# Patient Record
Sex: Female | Born: 1975
Health system: Southern US, Community
[De-identification: ages and names within clinical notes are randomized; demographics above are authoritative.]

## PROBLEM LIST (undated history)

## (undated) DIAGNOSIS — E785 Hyperlipidemia, unspecified: Secondary | ICD-10-CM

## (undated) DIAGNOSIS — T8859XA Other complications of anesthesia, initial encounter: Secondary | ICD-10-CM

## (undated) DIAGNOSIS — Z800 Family history of malignant neoplasm of digestive organs: Secondary | ICD-10-CM

## (undated) DIAGNOSIS — Z87442 Personal history of urinary calculi: Secondary | ICD-10-CM

## (undated) DIAGNOSIS — C50919 Malignant neoplasm of unspecified site of unspecified female breast: Secondary | ICD-10-CM

## (undated) DIAGNOSIS — Z9221 Personal history of antineoplastic chemotherapy: Secondary | ICD-10-CM

## (undated) DIAGNOSIS — Z803 Family history of malignant neoplasm of breast: Secondary | ICD-10-CM

## (undated) DIAGNOSIS — T4145XA Adverse effect of unspecified anesthetic, initial encounter: Secondary | ICD-10-CM

## (undated) HISTORY — PX: REDUCTION MAMMAPLASTY: SUR839

## (undated) HISTORY — PX: WISDOM TOOTH EXTRACTION: SHX21

## (undated) HISTORY — DX: Family history of malignant neoplasm of breast: Z80.3

## (undated) HISTORY — PX: MASTECTOMY: SHX3

## (undated) HISTORY — PX: AUGMENTATION MAMMAPLASTY: SUR837

## (undated) HISTORY — DX: Family history of malignant neoplasm of digestive organs: Z80.0

## (undated) HISTORY — PX: COLONOSCOPY: SHX174

## (undated) HISTORY — DX: Malignant neoplasm of unspecified site of unspecified female breast: C50.919

---

## 1998-05-07 ENCOUNTER — Other Ambulatory Visit: Admission: RE | Admit: 1998-05-07 | Discharge: 1998-05-07 | Payer: Self-pay | Admitting: Gynecology

## 1999-05-10 ENCOUNTER — Other Ambulatory Visit: Admission: RE | Admit: 1999-05-10 | Discharge: 1999-05-10 | Payer: Self-pay | Admitting: Gynecology

## 2000-05-21 ENCOUNTER — Other Ambulatory Visit: Admission: RE | Admit: 2000-05-21 | Discharge: 2000-05-21 | Payer: Self-pay | Admitting: Gynecology

## 2001-05-30 ENCOUNTER — Other Ambulatory Visit: Admission: RE | Admit: 2001-05-30 | Discharge: 2001-05-30 | Payer: Self-pay | Admitting: *Deleted

## 2001-06-04 ENCOUNTER — Encounter: Admission: RE | Admit: 2001-06-04 | Discharge: 2001-06-04 | Payer: Self-pay | Admitting: *Deleted

## 2001-06-04 ENCOUNTER — Encounter: Payer: Self-pay | Admitting: *Deleted

## 2002-02-07 ENCOUNTER — Encounter: Payer: Self-pay | Admitting: Otolaryngology

## 2002-02-07 ENCOUNTER — Encounter: Admission: RE | Admit: 2002-02-07 | Discharge: 2002-02-07 | Payer: Self-pay | Admitting: Otolaryngology

## 2002-03-22 ENCOUNTER — Emergency Department (HOSPITAL_COMMUNITY): Admission: EM | Admit: 2002-03-22 | Discharge: 2002-03-22 | Payer: Self-pay | Admitting: Emergency Medicine

## 2002-03-22 ENCOUNTER — Encounter: Payer: Self-pay | Admitting: Emergency Medicine

## 2004-07-06 ENCOUNTER — Other Ambulatory Visit: Admission: RE | Admit: 2004-07-06 | Discharge: 2004-07-06 | Payer: Self-pay | Admitting: Obstetrics and Gynecology

## 2005-01-03 ENCOUNTER — Encounter: Admission: RE | Admit: 2005-01-03 | Discharge: 2005-01-03 | Payer: Self-pay | Admitting: Family Medicine

## 2005-02-02 ENCOUNTER — Ambulatory Visit (HOSPITAL_COMMUNITY): Admission: RE | Admit: 2005-02-02 | Discharge: 2005-02-02 | Payer: Self-pay | Admitting: Gastroenterology

## 2005-02-02 ENCOUNTER — Encounter (INDEPENDENT_AMBULATORY_CARE_PROVIDER_SITE_OTHER): Payer: Self-pay | Admitting: *Deleted

## 2005-07-12 ENCOUNTER — Other Ambulatory Visit: Admission: RE | Admit: 2005-07-12 | Discharge: 2005-07-12 | Payer: Self-pay | Admitting: Obstetrics and Gynecology

## 2006-07-12 ENCOUNTER — Other Ambulatory Visit: Admission: RE | Admit: 2006-07-12 | Discharge: 2006-07-12 | Payer: Self-pay | Admitting: Obstetrics and Gynecology

## 2006-07-25 ENCOUNTER — Encounter: Admission: RE | Admit: 2006-07-25 | Discharge: 2006-07-25 | Payer: Self-pay | Admitting: Internal Medicine

## 2007-07-16 ENCOUNTER — Other Ambulatory Visit: Admission: RE | Admit: 2007-07-16 | Discharge: 2007-07-16 | Payer: Self-pay | Admitting: Obstetrics and Gynecology

## 2007-12-31 ENCOUNTER — Encounter: Admission: RE | Admit: 2007-12-31 | Discharge: 2007-12-31 | Payer: Self-pay | Admitting: Obstetrics and Gynecology

## 2008-07-16 ENCOUNTER — Other Ambulatory Visit: Admission: RE | Admit: 2008-07-16 | Discharge: 2008-07-16 | Payer: Self-pay | Admitting: Obstetrics and Gynecology

## 2009-07-22 ENCOUNTER — Other Ambulatory Visit: Admission: RE | Admit: 2009-07-22 | Discharge: 2009-07-22 | Payer: Self-pay | Admitting: Obstetrics and Gynecology

## 2010-07-05 ENCOUNTER — Inpatient Hospital Stay (HOSPITAL_COMMUNITY)
Admission: AD | Admit: 2010-07-05 | Discharge: 2010-07-05 | Payer: Self-pay | Source: Home / Self Care | Attending: Obstetrics and Gynecology | Admitting: Obstetrics and Gynecology

## 2010-07-26 ENCOUNTER — Ambulatory Visit (HOSPITAL_COMMUNITY)
Admission: RE | Admit: 2010-07-26 | Discharge: 2010-07-26 | Payer: Self-pay | Source: Home / Self Care | Attending: Obstetrics and Gynecology | Admitting: Obstetrics and Gynecology

## 2010-07-28 ENCOUNTER — Inpatient Hospital Stay (HOSPITAL_COMMUNITY)
Admission: AD | Admit: 2010-07-28 | Discharge: 2010-07-30 | Payer: Self-pay | Source: Home / Self Care | Attending: Obstetrics and Gynecology | Admitting: Obstetrics and Gynecology

## 2010-08-01 ENCOUNTER — Encounter: Payer: Self-pay | Admitting: Neurology

## 2010-08-01 LAB — CBC
HCT: 39.8 % (ref 36.0–46.0)
HCT: 30.4 % — ABNORMAL LOW (ref 36.0–46.0)
Hemoglobin: 13.6 g/dL (ref 12.0–15.0)
Hemoglobin: 10.5 g/dL — ABNORMAL LOW (ref 12.0–15.0)
MCV: 98.8 fL (ref 78.0–100.0)
MCV: 98.7 fL (ref 78.0–100.0)
Platelets: 236 10*3/uL (ref 150–400)
Platelets: 180 10*3/uL (ref 150–400)
RBC: 4.03 MIL/uL (ref 3.87–5.11)
WBC: 16.2 10*3/uL — ABNORMAL HIGH (ref 4.0–10.5)
WBC: 18.2 10*3/uL — ABNORMAL HIGH (ref 4.0–10.5)

## 2010-08-01 LAB — RPR: RPR Ser Ql: NONREACTIVE

## 2010-08-02 ENCOUNTER — Encounter
Admission: RE | Admit: 2010-08-02 | Discharge: 2010-08-09 | Payer: Self-pay | Source: Home / Self Care | Attending: Obstetrics and Gynecology | Admitting: Obstetrics and Gynecology

## 2010-10-05 ENCOUNTER — Other Ambulatory Visit (HOSPITAL_COMMUNITY)
Admission: RE | Admit: 2010-10-05 | Discharge: 2010-10-05 | Disposition: A | Discharge status: Home / Self Care | Payer: PRIVATE HEALTH INSURANCE | Source: Ambulatory Visit | Attending: Obstetrics and Gynecology | Admitting: Obstetrics and Gynecology

## 2010-10-05 ENCOUNTER — Other Ambulatory Visit: Payer: Self-pay | Admitting: Obstetrics and Gynecology

## 2010-10-05 DIAGNOSIS — Z01419 Encounter for gynecological examination (general) (routine) without abnormal findings: Secondary | ICD-10-CM | POA: Insufficient documentation

## 2011-10-09 ENCOUNTER — Other Ambulatory Visit: Payer: Self-pay | Admitting: Obstetrics and Gynecology

## 2011-10-09 ENCOUNTER — Other Ambulatory Visit (HOSPITAL_COMMUNITY)
Admission: RE | Admit: 2011-10-09 | Discharge: 2011-10-09 | Disposition: A | Discharge status: Home / Self Care | Payer: BC Managed Care – PPO | Source: Ambulatory Visit | Attending: Obstetrics and Gynecology | Admitting: Obstetrics and Gynecology

## 2011-10-09 DIAGNOSIS — Z01419 Encounter for gynecological examination (general) (routine) without abnormal findings: Secondary | ICD-10-CM | POA: Insufficient documentation

## 2012-10-09 ENCOUNTER — Other Ambulatory Visit (HOSPITAL_COMMUNITY)
Admission: RE | Admit: 2012-10-09 | Discharge: 2012-10-09 | Disposition: A | Discharge status: Home / Self Care | Payer: BC Managed Care – PPO | Source: Ambulatory Visit | Attending: Obstetrics and Gynecology | Admitting: Obstetrics and Gynecology

## 2012-10-09 ENCOUNTER — Other Ambulatory Visit: Payer: Self-pay | Admitting: Obstetrics and Gynecology

## 2012-10-09 DIAGNOSIS — Z01419 Encounter for gynecological examination (general) (routine) without abnormal findings: Secondary | ICD-10-CM | POA: Insufficient documentation

## 2012-10-09 DIAGNOSIS — Z1151 Encounter for screening for human papillomavirus (HPV): Secondary | ICD-10-CM | POA: Insufficient documentation

## 2012-10-25 ENCOUNTER — Emergency Department
Admission: EM | Admit: 2012-10-25 | Discharge: 2012-10-25 | Disposition: A | Discharge status: Home / Self Care | Payer: BC Managed Care – PPO | Source: Home / Self Care | Attending: Family Medicine | Admitting: Family Medicine

## 2012-10-25 ENCOUNTER — Encounter: Payer: Self-pay | Admitting: *Deleted

## 2012-10-25 DIAGNOSIS — J069 Acute upper respiratory infection, unspecified: Secondary | ICD-10-CM

## 2012-10-25 MED ORDER — AMOXICILLIN 875 MG PO TABS: 875.0000 mg | ORAL_TABLET | Freq: Two times a day (BID) | ORAL | Status: DC

## 2012-10-25 MED ORDER — BENZONATATE 200 MG PO CAPS: 200.0000 mg | ORAL_CAPSULE | Freq: Every day | ORAL | Status: DC

## 2012-10-25 NOTE — ED Provider Notes (Signed)
History     CSN: [id number]  Arrival date & time 10/25/12  0815   None     Chief Complaint  Patient presents with  . Nasal Congestion      HPI Comments: Patient complains of onset of URI symptoms 6 days ago with mild sore throat, sinus congestion, fatigue, and chills.  She then developed a headache and non-productive cough.  The history is provided by the patient.    History reviewed. No pertinent past medical history.  History reviewed. No pertinent past surgical history.  Family History  Problem Relation Age of Onset  . Heart attack Mother   . Hyperlipidemia Mother   . Hypertension Mother   . Heart failure Mother     History  Substance Use Topics  . Smoking status: Never Smoker   . Smokeless tobacco: Not on file  . Alcohol Use: No    OB History   Grav Para Term Preterm Abortions TAB SAB Ect Mult Living                  Review of Systems + sore throat + cough No pleuritic pain No wheezing + nasal congestion + post-nasal drainage No sinus pain/pressure No itchy/red eyes ? earache No hemoptysis No SOB No fever, + chills No nausea No vomiting No abdominal pain No diarrhea No urinary symptoms No skin rashes + fatigue + myalgias + headache Used OTC meds without relief  Allergies  Review of patient's allergies indicates no known allergies.  Home Medications   Current Outpatient Rx  Name  Route  Sig  Dispense  Refill  . amoxicillin (AMOXIL) 875 MG tablet   Oral   Take 1 tablet (875 mg total) by mouth 2 (two) times daily. (Rx void after 11/02/12)   20 tablet   0   . benzonatate (TESSALON) 200 MG capsule   Oral   Take 1 capsule (200 mg total) by mouth at bedtime.   12 capsule   0     BP 133/90  Pulse 107  Temp(Src) 98.2 F (36.8 C) (Oral)  Ht 5' 0.25" (1.53 m)  Wt 131 lb (59.421 kg)  BMI 25.38 kg/m2  SpO2 96%  LMP 10/18/2012  Physical Exam Nursing notes and Vital Signs reviewed. Appearance:  Patient appears healthy, stated age,  and in no acute distress Eyes:  Pupils are equal, round, and reactive to light and accomodation.  Extraocular movement is intact.  Conjunctivae are not inflamed  Ears:  Canals normal.  Tympanic membranes normal.  Nose:  Mildly congested turbinates.  No sinus tenderness.   Pharynx:  Normal Neck:  Supple.   Tender shotty posterior nodes are palpated bilaterally  Lungs:  Clear to auscultation.  Breath sounds are equal.  Chest:  Distinct tenderness to palpation over the mid-sternum.  Heart:  Regular rate and rhythm without murmurs, rubs, or gallops.  Abdomen:  Nontender without masses or hepatosplenomegaly.  Bowel sounds are present.  No CVA or flank tenderness.  Extremities:  No edema.   Skin:  No rash present.    ED Course  Procedures  none      1. Acute upper respiratory infections of unspecified site; viral URI       MDM  There is no evidence of bacterial infection today.   Treat symptomatically for now: Take Mucinex D (guaifenesin with decongestant) twice daily for congestion.  Increase fluid intake, rest. May use Afrin nasal spray (or generic oxymetazoline) twice daily for about 5 days.  Also recommend  using saline nasal spray several times daily and saline nasal irrigation (AYR is a common brand) Stop all antihistamines for now, and other non-prescription cough/cold preparations. May take Ibuprofen 200mg , 4 tabs every 8 hours with food for chest/sternum discomfort, headache, sore throat, etc. Begin Amoxicillin if not improving about one week or if persistent fever develops (Given a prescription to hold, with an expiration date)  Follow-up with family doctor if not improving about10 days.        Lattie Haw, MD 10/25/12 1003

## 2012-10-25 NOTE — ED Notes (Signed)
Pt c/o nasal congestion, HA, post nasal drip, cough and "scratchy" throat x 4 days. Denies fever. She has taken Nyquil with some relief.

## 2013-10-03 ENCOUNTER — Emergency Department
Admission: EM | Admit: 2013-10-03 | Discharge: 2013-10-03 | Disposition: A | Discharge status: Home / Self Care | Payer: BC Managed Care – PPO | Source: Home / Self Care | Attending: Family Medicine | Admitting: Family Medicine

## 2013-10-03 ENCOUNTER — Encounter: Payer: Self-pay | Admitting: Emergency Medicine

## 2013-10-03 DIAGNOSIS — J111 Influenza due to unidentified influenza virus with other respiratory manifestations: Secondary | ICD-10-CM

## 2013-10-03 DIAGNOSIS — R69 Illness, unspecified: Principal | ICD-10-CM

## 2013-10-03 HISTORY — DX: Hyperlipidemia, unspecified: E78.5

## 2013-10-03 MED ORDER — OSELTAMIVIR PHOSPHATE 75 MG PO CAPS: 75.0000 mg | ORAL_CAPSULE | Freq: Two times a day (BID) | ORAL | Status: DC

## 2013-10-03 MED ORDER — BENZONATATE 200 MG PO CAPS: ORAL_CAPSULE | ORAL | Status: DC

## 2013-10-03 NOTE — ED Provider Notes (Signed)
CSN: [id number]     Arrival date & time 10/03/13  0930 History   First MD Initiated Contact with Patient 10/03/13 [phone]     Chief Complaint  Patient presents with  . Nasal Congestion  . Generalized Body Aches  . Cough  . Headache  . Sore Throat      HPI Comments: Three days ago patient developed sudden onset sore throat, myalgias, nasal congestion, fatigue, headache, chills/sweats, and cough.  The history is provided by the patient.    Past Medical History  Diagnosis Date  . Hyperlipidemia    History reviewed. No pertinent past surgical history. Family History  Problem Relation Age of Onset  . Heart attack Mother   . Hyperlipidemia Mother   . Hypertension Mother   . Heart failure Mother    History  Substance Use Topics  . Smoking status: Never Smoker   . Smokeless tobacco: Not on file  . Alcohol Use: No   OB History   Grav Para Term Preterm Abortions TAB SAB Ect Mult Living                 Review of Systems + sore throat + cough No pleuritic pain No wheezing + nasal congestion + post-nasal drainage No sinus pain/pressure No itchy/red eyes No earache No hemoptysis No SOB ? fever, + chills/sweats No nausea + vomiting, resolved No abdominal pain No diarrhea No urinary symptoms No skin rash + fatigue + myalgias + headache Used OTC meds without relief  Allergies  Review of patient's allergies indicates no known allergies.  Home Medications   Current Outpatient Rx  Name  Route  Sig  Dispense  Refill  . lovastatin (MEVACOR) 20 MG tablet   Oral   Take 20 mg by mouth at bedtime.         . benzonatate (TESSALON) 200 MG capsule      Take one cap by mouth at bedtime as needed for cough   12 capsule   0   . oseltamivir (TAMIFLU) 75 MG capsule   Oral   Take 1 capsule (75 mg total) by mouth every 12 (twelve) hours.   10 capsule   0    BP 123/85  Pulse 93  Temp(Src) 98.4 F (36.9 C) (Oral)  Resp 16  Ht 5\' 1"  (1.549 m)  Wt 135 lb (61.236 kg)   BMI 25.52 kg/m2  SpO2 98%  LMP 09/16/2013 Physical Exam Nursing notes and Vital Signs reviewed. Appearance:  Patient appears healthy, stated age, and in no acute distress Eyes:  Pupils are equal, round, and reactive to light and accomodation.  Extraocular movement is intact.  Conjunctivae are not inflamed  Ears:  Canals normal.  Tympanic membranes normal.  Nose:  Mildly congested turbinates.  No sinus tenderness.   Pharynx:  Normal Neck:  Supple.  Tender right posterior nodes are palpated   Lungs:  Clear to auscultation.  Breath sounds are equal.  Chest:  Distinct tenderness to palpation over the mid-sternum.  Heart:  Regular rate and rhythm without murmurs, rubs, or gallops.  Abdomen:  Nontender without masses or hepatosplenomegaly.  Bowel sounds are present.  No CVA or flank tenderness.  Extremities:  No edema.  No calf tenderness Skin:  No rash present.   ED Course  Procedures  none      MDM   1. Influenza-like illness    Begin Tamiflu.  Prescription written for Benzonatate [facility]) to take at bedtime for night-time cough.  Take plain Mucinex (1200  mg guaifenesin) twice daily for cough and congestion.  May add Sudafed for sinus congestion.   Increase fluid intake, rest. May use Afrin nasal spray (or generic oxymetazoline) twice daily for about 5 days.  Also recommend using saline nasal spray several times daily and saline nasal irrigation (AYR is a common brand) Try warm salt water gargles for sore throat.  Stop all antihistamines for now, and other non-prescription cough/cold preparations. May take Ibuprofen 200mg , 4 tabs every 8 hours with food for chest/sternum discomfort.   Follow-up with family doctor if not improving 7 to 10 days.    Kandra Nicolas, MD 10/05/13 2150

## 2013-10-03 NOTE — ED Notes (Signed)
Reports onset of congestion, body aches, cough, headache and sore throat 3 days ago; remained in bed past 2 days; denies known fever. No OTCs today.

## 2013-10-03 NOTE — Discharge Instructions (Signed)
Take plain Mucinex (1200 mg guaifenesin) twice daily for cough and congestion.  May add Sudafed for sinus congestion.   Increase fluid intake, rest. May use Afrin nasal spray (or generic oxymetazoline) twice daily for about 5 days.  Also recommend using saline nasal spray several times daily and saline nasal irrigation (AYR is a common brand) Try warm salt water gargles for sore throat.  Stop all antihistamines for now, and other non-prescription cough/cold preparations. May take Ibuprofen 200mg , 4 tabs every 8 hours with food for chest/sternum discomfort.   Follow-up with family doctor if not improving 7 to 10 days.

## 2013-11-04 ENCOUNTER — Emergency Department
Admission: EM | Admit: 2013-11-04 | Discharge: 2013-11-04 | Disposition: A | Discharge status: Home / Self Care | Payer: BC Managed Care – PPO | Source: Home / Self Care | Attending: Emergency Medicine | Admitting: Emergency Medicine

## 2013-11-04 ENCOUNTER — Encounter: Payer: Self-pay | Admitting: Emergency Medicine

## 2013-11-04 DIAGNOSIS — J029 Acute pharyngitis, unspecified: Secondary | ICD-10-CM

## 2013-11-04 DIAGNOSIS — J020 Streptococcal pharyngitis: Secondary | ICD-10-CM

## 2013-11-04 LAB — POCT RAPID STREP A (OFFICE): RAPID STREP A SCREEN: POSITIVE — AB

## 2013-11-04 MED ORDER — AMOXICILLIN 875 MG PO TABS: 875.0000 mg | ORAL_TABLET | Freq: Two times a day (BID) | ORAL | Status: DC

## 2013-11-04 NOTE — ED Notes (Signed)
Alexis Underwood c/o bilateral ear pain and sore throat x this AM. Denies fever.

## 2013-11-04 NOTE — ED Provider Notes (Signed)
CSN: [id number]     Arrival date & time 11/04/13  1744 History   First MD Initiated Contact with Patient 11/04/13 1754     Chief Complaint  Patient presents with  . Sore Throat  . Otalgia   (Consider location/radiation/quality/duration/timing/severity/associated sxs/prior Treatment) HPI Alexis Underwood is a 38 y.o. female who complains of onset of cold symptoms for 1 days.  The symptoms are constant and mild-moderate in severity.  Hasn't used any medications yet. + sore throat No cough No pleuritic pain No wheezing No nasal congestion No post-nasal drainage No sinus pain/pressure No chest congestion No itchy/red eyes + L earache No hemoptysis No SOB No chills/sweats No fever No nausea No vomiting No abdominal pain No diarrhea No skin rashes + fatigue No myalgias No headache     Past Medical History  Diagnosis Date  . Hyperlipidemia    History reviewed. No pertinent past surgical history. Family History  Problem Relation Age of Onset  . Heart attack Mother   . Hyperlipidemia Mother   . Hypertension Mother   . Heart failure Mother    History  Substance Use Topics  . Smoking status: Never Smoker   . Smokeless tobacco: Never Used  . Alcohol Use: No   OB History   Grav Para Term Preterm Abortions TAB SAB Ect Mult Living                 Review of Systems  All other systems reviewed and are negative.   Allergies  Review of patient's allergies indicates no known allergies.  Home Medications   Prior to Admission medications   Medication Sig Start Date End Date Taking? Authorizing Provider  benzonatate (TESSALON) 200 MG capsule Take one cap by mouth at bedtime as needed for cough 10/03/13   Kandra Nicolas, MD  lovastatin (MEVACOR) 20 MG tablet Take 20 mg by mouth at bedtime.    Historical Provider, MD  oseltamivir (TAMIFLU) 75 MG capsule Take 1 capsule (75 mg total) by mouth every 12 (twelve) hours. 10/03/13   Kandra Nicolas, MD   BP 137/88  Pulse 92  Temp(Src)  98.3 F (36.8 C) (Oral)  Resp 14  Wt 140 lb (63.504 kg)  SpO2 98%  LMP 10/17/2013 Physical Exam  Nursing note and vitals reviewed. Constitutional: She is oriented to person, place, and time. She appears well-developed and well-nourished.  HENT:  Head: Normocephalic and atraumatic.  Right Ear: Tympanic membrane, external ear and ear canal normal.  Left Ear: Tympanic membrane, external ear and ear canal normal.  Nose: Nose normal.  Mouth/Throat: Posterior oropharyngeal erythema present.  Tonsils 1+ bilateral with minimal exudate  Eyes: No scleral icterus.  Neck: Neck supple.  Cardiovascular: Normal rate, regular rhythm and normal heart sounds.   Pulmonary/Chest: Effort normal and breath sounds normal. No respiratory distress. She has no decreased breath sounds. She has no wheezes.  Lymphadenopathy:       Head (right side): Tonsillar adenopathy present.       Head (left side): Tonsillar adenopathy present.  Neurological: She is alert and oriented to person, place, and time.  Skin: Skin is warm and dry.  Psychiatric: She has a normal mood and affect. Her speech is normal.    ED Course  Procedures (including critical care time) Labs Review Labs Reviewed  POCT RAPID STREP A (OFFICE) - Abnormal; Notable for the following:    Rapid Strep A Screen Positive (*)    All other components within normal limits    Imaging  Review No results found.   MDM   1. Acute pharyngitis   2. Streptococcal sore throat   Take the prescribed antibiotic as instructed.  Rapid strep positive.  Change toothbrush tomorrow, good hand & mouth hygiene.  OTC NSAIDs for sore throat, nasal saline PRN. Follow up with your primary doctor if no improvement in 5-7 days, sooner if increasing pain, fever, or new symptoms.     Janeann Forehand, MD 11/04/13 [phone]

## 2014-10-19 ENCOUNTER — Other Ambulatory Visit (HOSPITAL_COMMUNITY)
Admission: RE | Admit: 2014-10-19 | Discharge: 2014-10-19 | Disposition: A | Discharge status: Home / Self Care | Payer: BLUE CROSS/BLUE SHIELD | Source: Ambulatory Visit | Attending: Obstetrics and Gynecology | Admitting: Obstetrics and Gynecology

## 2014-10-19 ENCOUNTER — Other Ambulatory Visit: Payer: Self-pay | Admitting: Obstetrics and Gynecology

## 2014-10-19 DIAGNOSIS — Z01419 Encounter for gynecological examination (general) (routine) without abnormal findings: Secondary | ICD-10-CM | POA: Insufficient documentation

## 2014-10-20 LAB — CYTOLOGY - PAP

## 2015-10-19 ENCOUNTER — Other Ambulatory Visit (HOSPITAL_COMMUNITY)
Admission: RE | Admit: 2015-10-19 | Discharge: 2015-10-19 | Disposition: A | Discharge status: Home / Self Care | Payer: BLUE CROSS/BLUE SHIELD | Source: Ambulatory Visit | Attending: Obstetrics and Gynecology | Admitting: Obstetrics and Gynecology

## 2015-10-19 ENCOUNTER — Other Ambulatory Visit: Payer: Self-pay | Admitting: Obstetrics and Gynecology

## 2015-10-19 DIAGNOSIS — Z01419 Encounter for gynecological examination (general) (routine) without abnormal findings: Secondary | ICD-10-CM | POA: Insufficient documentation

## 2015-10-19 DIAGNOSIS — Z1151 Encounter for screening for human papillomavirus (HPV): Secondary | ICD-10-CM | POA: Insufficient documentation

## 2015-10-21 LAB — CYTOLOGY - PAP

## 2016-02-11 ENCOUNTER — Other Ambulatory Visit: Payer: Self-pay | Admitting: Family Medicine

## 2016-02-11 DIAGNOSIS — Z1231 Encounter for screening mammogram for malignant neoplasm of breast: Secondary | ICD-10-CM

## 2016-04-10 ENCOUNTER — Ambulatory Visit
Admission: RE | Admit: 2016-04-10 | Discharge: 2016-04-10 | Disposition: A | Discharge status: Home / Self Care | Payer: BLUE CROSS/BLUE SHIELD | Source: Ambulatory Visit | Attending: Family Medicine | Admitting: Family Medicine

## 2016-04-10 DIAGNOSIS — Z1231 Encounter for screening mammogram for malignant neoplasm of breast: Secondary | ICD-10-CM

## 2017-03-05 ENCOUNTER — Other Ambulatory Visit: Payer: Self-pay | Admitting: Obstetrics and Gynecology

## 2017-03-05 DIAGNOSIS — Z1231 Encounter for screening mammogram for malignant neoplasm of breast: Secondary | ICD-10-CM

## 2017-04-12 ENCOUNTER — Ambulatory Visit
Admission: RE | Admit: 2017-04-12 | Discharge: 2017-04-12 | Disposition: A | Discharge status: Home / Self Care | Payer: BLUE CROSS/BLUE SHIELD | Source: Ambulatory Visit | Attending: Obstetrics and Gynecology | Admitting: Obstetrics and Gynecology

## 2017-04-12 DIAGNOSIS — Z1231 Encounter for screening mammogram for malignant neoplasm of breast: Secondary | ICD-10-CM

## 2017-04-13 ENCOUNTER — Other Ambulatory Visit: Payer: Self-pay | Admitting: Obstetrics and Gynecology

## 2017-04-13 DIAGNOSIS — R928 Other abnormal and inconclusive findings on diagnostic imaging of breast: Secondary | ICD-10-CM

## 2017-04-16 ENCOUNTER — Other Ambulatory Visit: Payer: Self-pay | Admitting: Obstetrics and Gynecology

## 2017-04-16 ENCOUNTER — Ambulatory Visit
Admission: RE | Admit: 2017-04-16 | Discharge: 2017-04-16 | Disposition: A | Discharge status: Home / Self Care | Payer: BLUE CROSS/BLUE SHIELD | Source: Ambulatory Visit | Attending: Obstetrics and Gynecology | Admitting: Obstetrics and Gynecology

## 2017-04-16 DIAGNOSIS — R928 Other abnormal and inconclusive findings on diagnostic imaging of breast: Secondary | ICD-10-CM

## 2017-04-18 ENCOUNTER — Telehealth: Payer: Self-pay | Admitting: *Deleted

## 2017-04-18 HISTORY — PX: BREAST BIOPSY: SHX20

## 2017-04-18 NOTE — Telephone Encounter (Signed)
Confirmed [facility] for 04/25/17 at 1215 .  Instructions and contact information given.

## 2017-04-18 NOTE — Telephone Encounter (Signed)
Left vm regarding [place] for 04/25/17. Contact information provided.

## 2017-04-19 ENCOUNTER — Other Ambulatory Visit: Payer: Self-pay | Admitting: *Deleted

## 2017-04-19 DIAGNOSIS — C50412 Malignant neoplasm of upper-outer quadrant of left female breast: Secondary | ICD-10-CM

## 2017-04-19 DIAGNOSIS — Z170 Estrogen receptor positive status [ER+]: Principal | ICD-10-CM

## 2017-04-25 ENCOUNTER — Ambulatory Visit (HOSPITAL_BASED_OUTPATIENT_CLINIC_OR_DEPARTMENT_OTHER): Payer: BLUE CROSS/BLUE SHIELD | Admitting: Genetics

## 2017-04-25 ENCOUNTER — Encounter: Payer: Self-pay | Admitting: Hematology and Oncology

## 2017-04-25 ENCOUNTER — Ambulatory Visit
Admission: RE | Admit: 2017-04-25 | Discharge: 2017-04-25 | Disposition: A | Discharge status: Home / Self Care | Payer: BLUE CROSS/BLUE SHIELD | Source: Ambulatory Visit | Attending: Radiation Oncology | Admitting: Radiation Oncology

## 2017-04-25 ENCOUNTER — Other Ambulatory Visit (HOSPITAL_BASED_OUTPATIENT_CLINIC_OR_DEPARTMENT_OTHER): Payer: BLUE CROSS/BLUE SHIELD

## 2017-04-25 ENCOUNTER — Encounter: Payer: Self-pay | Admitting: Genetics

## 2017-04-25 ENCOUNTER — Ambulatory Visit (HOSPITAL_BASED_OUTPATIENT_CLINIC_OR_DEPARTMENT_OTHER): Payer: BLUE CROSS/BLUE SHIELD | Admitting: Hematology and Oncology

## 2017-04-25 ENCOUNTER — Ambulatory Visit: Payer: BLUE CROSS/BLUE SHIELD | Attending: General Surgery | Admitting: Physical Therapy

## 2017-04-25 ENCOUNTER — Encounter: Payer: Self-pay | Admitting: Physical Therapy

## 2017-04-25 DIAGNOSIS — Z170 Estrogen receptor positive status [ER+]: Secondary | ICD-10-CM

## 2017-04-25 DIAGNOSIS — C50412 Malignant neoplasm of upper-outer quadrant of left female breast: Secondary | ICD-10-CM | POA: Diagnosis present

## 2017-04-25 DIAGNOSIS — Z803 Family history of malignant neoplasm of breast: Secondary | ICD-10-CM

## 2017-04-25 DIAGNOSIS — Z800 Family history of malignant neoplasm of digestive organs: Secondary | ICD-10-CM

## 2017-04-25 DIAGNOSIS — R293 Abnormal posture: Secondary | ICD-10-CM | POA: Diagnosis present

## 2017-04-25 LAB — CBC WITH DIFFERENTIAL/PLATELET
BASO%: 0.5 % (ref 0.0–2.0)
BASOS ABS: 0 10*3/uL (ref 0.0–0.1)
EOS ABS: 0.1 10*3/uL (ref 0.0–0.5)
EOS%: 1.6 % (ref 0.0–7.0)
HEMATOCRIT: 40.2 % (ref 34.8–46.6)
HGB: 14 g/dL (ref 11.6–15.9)
LYMPH%: 26.4 % (ref 14.0–49.7)
MCH: 34 pg (ref 25.1–34.0)
MCHC: 34.8 g/dL (ref 31.5–36.0)
MCV: 97.5 fL (ref 79.5–101.0)
MONO#: 0.9 10*3/uL (ref 0.1–0.9)
MONO%: 10.6 % (ref 0.0–14.0)
NEUT#: 5.4 10*3/uL (ref 1.5–6.5)
NEUT%: 60.9 % (ref 38.4–76.8)
PLATELETS: 293 10*3/uL (ref 145–400)
RBC: 4.13 10*6/uL (ref 3.70–5.45)
RDW: 12.4 % (ref 11.2–14.5)
WBC: 8.9 10*3/uL (ref 3.9–10.3)
lymph#: 2.3 10*3/uL (ref 0.9–3.3)

## 2017-04-25 LAB — COMPREHENSIVE METABOLIC PANEL
ALT: 16 U/L (ref 0–55)
ANION GAP: 8 meq/L (ref 3–11)
AST: 15 U/L (ref 5–34)
Albumin: 4.2 g/dL (ref 3.5–5.0)
Alkaline Phosphatase: 81 U/L (ref 40–150)
BILIRUBIN TOTAL: 0.42 mg/dL (ref 0.20–1.20)
BUN: 12.1 mg/dL (ref 7.0–26.0)
CALCIUM: 9.1 mg/dL (ref 8.4–10.4)
CHLORIDE: 105 meq/L (ref 98–109)
CO2: 27 mEq/L (ref 22–29)
CREATININE: 0.9 mg/dL (ref 0.6–1.1)
EGFR: >60 mL/min/{1.73_m2} (ref >60)
Glucose: 102 mg/dl (ref 70–140)
Potassium: 3.7 mEq/L (ref 3.5–5.1)
Sodium: 140 mEq/L (ref 136–145)
Total Protein: 7.2 g/dL (ref 6.4–8.3)

## 2017-04-25 MED ORDER — TAMOXIFEN CITRATE 20 MG PO TABS: 20.0000 mg | ORAL_TABLET | Freq: Every day | ORAL | 0 refills | Status: DC

## 2017-04-25 NOTE — Progress Notes (Signed)
REFERRING PROVIDER: Nicholas Lose, MD [place], [place] [address]  PRIMARY PROVIDER:  Carol Ada, MD  PRIMARY REASON FOR VISIT:  1. Malignant neoplasm of upper-outer quadrant of left breast in female, estrogen receptor positive ([place])   2. Family history of breast cancer   3. Family history of throat cancer     HISTORY OF PRESENT ILLNESS:   Alexis Underwood, a 41 y.o. female, was seen for a [place] cancer genetics consultation at the request of Dr. Lindi Underwood due to a personal and family history of cancer.  [name] presents to clinic today to discuss the possibility of a hereditary predisposition to cancer, genetic testing, and to further clarify her future cancer risks, as well as potential cancer risks for family members.   In October 2018, at the age of 50, 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 She is planing on having a left mastectomy, but would like genetic test results to help make this dicision.   CANCER HISTORY:    Malignant neoplasm of upper-outer quadrant of left breast in female, estrogen receptor positive ([place])   04/16/2017 Initial Diagnosis    Screening mammogram detected calcifications left breast UOQ 9.3 cm, at 2:00: 1.4 cm and at 1:00: 8 mm; biopsy of mass at 1:00: IDC grade 2 with DCIS ER 95%, PR 90%,Ki-67 45%, HER-2 positive ratio 5.18; iopsy of the 2:00 mass and calcifications were DCIS with suspicion for microinvasion, T1 cN0 stage IA clinical stage AJCC 8        HORMONAL RISK FACTORS:  First live birth at age 80  Ovaries intact: yes.  Hysterectomy: no.  Colonoscopy: yes; reportedly normal. 'about 10 or more hears ago' Mammogram within the last year: yes.   Past Medical History:  Diagnosis Date  . Breast cancer ([place])   . Family history of breast cancer   . Family history of throat cancer   . Hyperlipidemia     No past surgical history on file.  Social History   Social  History  . Marital status: Married    Spouse name: N/A  . Number of children: N/A  . Years of education: N/A   Social History Main Topics  . Smoking status: Never Smoker  . Smokeless tobacco: Never Used  . Alcohol use No  . Drug use: No  . Sexual activity: Not Asked   Other Topics Concern  . None   Social History Narrative  . None     FAMILY HISTORY:  We obtained a detailed, 4-generation family history.  Significant diagnoses are listed below: Family History  Problem Relation Age of Onset  . Heart attack Mother   . Hyperlipidemia Mother   . Hypertension Mother   . Heart failure Mother   . Breast cancer Paternal Grandmother 52  . Heart disease Maternal Grandfather        died in 66's  . Throat cancer Paternal Grandfather 37   [name] has a 62 year old daughter.  [name] has a 90 year-old brother with no history of cancer.  He has a son and a daughter in their teens with no history of cancer.   [name] father died at the age of 84 with no history of cancer.  He had 2 brothers.  One of these uncles died in his late 45's without any history of cancer and has 2 sons in their 101's/40's.  [name] other uncle is currently in his  60's with no history of cancer.  He has a daughter in her late 20's/early 5's with no history of cancer.  [name] paternal grandfather had throat cancer in his 92's/60's and died in his 64's/70's.  [name] paternal grandmother was diagnosed with breast cancer at 66/41 years of age and died in her 18's/80's.   [name] mother is 58 with no history of cancer.  [name] has 1 maternal uncle who is in his 32's with no history of cancer and has no children.  [name] maternal grandfather died in his 40's from heart disease, and she does not know any information about her maternal grandmother.   [name] is is unaware of previous family history of genetic testing for hereditary cancer risks. Patient's maternal ancestors  are of Caucasian descent, and paternal ancestors are of Caucasian descent. There is no  reported Ashkenazi Jewish ancestry. There is no known consanguinity.  GENETIC COUNSELING ASSESSMENT: [name] is a 41 y.o. female with a personal and family history which is somewhat suggestive of a Hereditary Cancer Predisposition Syndrome. We, therefore, discussed and recommended the following at today's visit.   DISCUSSION: We reviewed the characteristics, features and inheritance patterns of hereditary cancer syndromes. We also discussed genetic testing, including the appropriate family members to test, the process of testing, insurance coverage and turn-around-time for results. We discussed the implications of a negative, positive and/or variant of uncertain significant result. We recommended Ms. Ensey pursue genetic testing for the Common Hereditary Cancer gene panel. The Hereditary Gene Panel offered by [facility] includes sequencing and/or deletion duplication testing of the following 47 genes: APC, ATM, AXIN2, BARD1, BMPR1A, BRCA1, BRCA2, BRIP1, CDH1, CDKN2A (p14ARF), CDKN2A (p16INK4a), CKD4, CHEK2, CTNNA1, DICER1, EPCAM (Deletion/duplication testing only), GREM1 (promoter region deletion/duplication testing only), KIT, MEN1, MLH1, MSH2, MSH3, MSH6, MUTYH, NBN, NF1, NHTL1, PALB2, PDGFRA, PMS2, POLD1, POLE, PTEN, RAD50, RAD51C, RAD51D, SDHB, SDHC, SDHD, SMAD4, SMARCA4. STK11, TP53, TSC1, TSC2, and VHL.  The following genes were evaluated for sequence changes only: SDHA and HOXB13 c.251G>A variant only.  We discussed that only 5-10% of cancers are associated with a Hereditary cancer predisposition syndrome.  One of the most common hereditary cancer syndromes that increases breast cancer risk is called Hereditary Breast and Ovarian Cancer (HBOC) syndrome.  This syndrome is caused by mutations in the BRCA1 and BRCA2 genes.  This syndrome increases an individual's lifetime risk to develop breast, ovarian,  pancreatic, and other types of cancer.  There are also many other cancer predisposition syndromes caused by mutations in several other genes.  We discussed that if she is found to have a mutation in one of these genes, it may impact surgical decisions, and alter future medical management recommendations such as increased cancer screenings and consideration of risk reducing surgeries.  A positive result could also have implications for the patient's family members.  A Negative result would mean we were unable to identify a hereditary component to her cancer, but does not rule out the possibility of a hereditary basis for her cancer.  There could be mutations that are undetectable by current technology, or in genes not yet tested or identified to increase cancer risk.    We discussed the potential to find a Variant of Uncertain Significance or VUS.  These are variants that have not yet been identified as pathogenic or benign, and it is unknown if this variant is associated with increased cancer risk or if this is a normal finding.  Most VUS's are  reclassified to benign or likely benign.   It should not be used to make medical management decisions. With time, we suspect the lab will determine the significance of any VUS's identified if any.   Based on Ms. Bia's personal and family history of cancer, she meets medical criteria for genetic testing. Despite that she meets criteria, she may still have an out of pocket cost. We discussed that if her out of pocket cost for testing is over $100, the laboratory will call and confirm whether she wants to proceed with testing.  If the out of pocket cost of testing is less than $100 she will be billed by the genetic testing laboratory.   PLAN: After considering the risks, benefits, and limitations, Ms. Wysocki  provided informed consent to pursue genetic testing and the blood sample was sent to [facility] for analysis of the Common Hereditary Cancers  Panel. Results should be available within approximately 2-3 weeks' time, at which point they will be disclosed by telephone to Ms. Moroney, as will any additional recommendations warranted by these results. Ms. Quin will receive a summary of her genetic counseling visit and a copy of her results once available. This information will also be available in Epic. We encouraged Ms. Fullman to remain in contact with cancer genetics annually so that we can continuously update the family history and inform her of any changes in cancer genetics and testing that may be of benefit for her family. Ms. Vanburen questions were answered to her satisfaction today. Our contact information was provided should additional questions or concerns arise.  We recommended that if genetic testing is negative, we would still recommend that her daughter start having annual mammograms/breast screening starting at age 32.    Lastly, we encouraged Ms. Siedschlag to remain in contact with cancer genetics annually so that we can continuously update the family history and inform her of any changes in cancer genetics and testing that may be of benefit for this family.   Ms.  Mandrell questions were answered to her satisfaction today. Our contact information was provided should additional questions or concerns arise. Thank you for the referral and allowing [place] to share in the care of your patient.   Tana Felts, MS Genetic Counselor [email or web address] phone: [phone]  The patient was seen for a total of 30 minutes in face-to-face genetic counseling.  The patient was accompanied today by her husband This patient was discussed with Drs. Magrinat, Alexis Underwood and/or Burr Medico who agrees with the above.

## 2017-04-25 NOTE — Therapy (Signed)
[place], [place], [address] Phone: [phone]   Fax:  [phone]  Physical Therapy Evaluation  Patient Details  Name: Alexis Underwood MRN: [id number] Date of Birth: 07/16/1975 Referring Provider: Dr. Autumn Messing  Encounter Date: 04/25/2017      PT End of Session - 04/25/17 1652    Visit Number 1   Number of Visits 2   Date for PT Re-Evaluation 06/25/17   PT Start Time 6294   PT Stop Time 7654  Also saw pt from [phone] for a total of 23 minutes   PT Time Calculation (min) 14 min   Activity Tolerance Patient tolerated treatment well   Behavior During Therapy [facility] for tasks assessed/performed      Past Medical History:  Diagnosis Date  . Breast cancer ([place])   . Family history of breast cancer   . Family history of throat cancer   . Hyperlipidemia     History reviewed. No pertinent surgical history.  There were no vitals filed for this visit.       Subjective Assessment - 04/25/17 1647    Subjective Patient reports she is here to be seen by her medical team for her newly diagnosed left breast cancer.   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 The area of calcs measures 9.3 cm and is triple positive with a Ki67 of 45%. she has no other medical problems.   Patient Stated Goals Reduce lymphedema risk and leasrn post op shoulder ROM HEP   Currently in Pain? No/denies            [facility] PT Assessment - 04/25/17 0001      Assessment   Medical Diagnosis Left breast cancer   Referring Provider Dr. Autumn Messing   Onset Date/Surgical Date 04/12/17   Hand Dominance Right   Prior Therapy none     Precautions   Precautions Other (comment)   Precaution Comments active cancer     Restrictions   Weight Bearing Restrictions No     Balance Screen   Has the patient fallen in the  past 6 months No   Has the patient had a decrease in activity level because of a fear of falling?  No   Is the patient reluctant to leave their home because of a fear of falling?  No     Home Environment   Living Environment Private residence   Living Arrangements Spouse/significant other;Children  Husband and 32 y.o. daughter   Available Help at Discharge Family     Prior Function   Level of Independence Independent   Vocation Full time employment   Technical brewer for medical office   Leisure She does not exercise     Cognition   Overall Cognitive Status Within Functional Limits for tasks assessed     Posture/Postural Control   Posture/Postural Control Postural limitations   Postural Limitations Rounded Shoulders;Forward head     ROM / Strength   AROM / PROM / Strength AROM;Strength     AROM   AROM Assessment Site Shoulder;Cervical   Right/Left Shoulder Right;Left   Right Shoulder Extension 57 Degrees   Right Shoulder Flexion 156 Degrees   Right Shoulder ABduction 157 Degrees   Right Shoulder Internal Rotation 85 Degrees   Right Shoulder External Rotation 89 Degrees   Left Shoulder Extension 50 Degrees   Left Shoulder Flexion  159 Degrees   Left Shoulder ABduction 166 Degrees   Left Shoulder Internal Rotation 79 Degrees   Left Shoulder External Rotation 90 Degrees   Cervical Flexion WNL   Cervical Extension WNL   Cervical - Right Side Bend WNL   Cervical - Left Side Bend WNL   Cervical - Right Rotation WNL   Cervical - Left Rotation WNL     Strength   Overall Strength Within functional limits for tasks performed           LYMPHEDEMA/ONCOLOGY QUESTIONNAIRE - 04/25/17 1650      Type   Cancer Type Left breast cancer     Lymphedema Assessments   Lymphedema Assessments Upper extremities     Right Upper Extremity Lymphedema   10 cm Proximal to Olecranon Process 28 cm   Olecranon Process 24.8 cm   10 cm Proximal to Ulnar Styloid Process 22.4  cm   Just Proximal to Ulnar Styloid Process 15.2 cm   Across Hand at [employer] 17.1 cm   At [place] of 2nd Digit 6 cm     Left Upper Extremity Lymphedema   10 cm Proximal to Olecranon Process 27.7 cm   Olecranon Process 24.2 cm   10 cm Proximal to Ulnar Styloid Process 22.1 cm   Just Proximal to Ulnar Styloid Process 15.2 cm   Across Hand at [employer] 17 cm   At [place] of 2nd Digit 5.9 cm         Objective measurements completed on examination: See above findings.       Patient was instructed today in a home exercise program today for post op shoulder range of motion. These included active assist shoulder flexion in sitting, scapular retraction, wall walking with shoulder abduction, and hands behind head external rotation.  She was encouraged to do these twice a day, holding 3 seconds and repeating 5 times when permitted by her physician.               PT Education - 04/25/17 1651    Education provided Yes   Education Details Lymphedema risk reduction and post op shoulder ROM HEP   Person(s) Educated Patient;Spouse   Methods Explanation;Demonstration;Handout   Comprehension Returned demonstration;Verbalized understanding              Breast Clinic Goals - 04/25/17 1654      Patient will be able to verbalize understanding of pertinent lymphedema risk reduction practices relevant to her diagnosis specifically related to skin care.   Time 1   Period Days   Status Achieved     Patient will be able to return demonstrate and/or verbalize understanding of the post-op home exercise program related to regaining shoulder range of motion.   Time 1   Period Days   Status Achieved     Patient will be able to verbalize understanding of the importance of attending the postoperative After Breast Cancer Class for further lymphedema risk reduction education and therapeutic exercise.   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 The area of calcs measures 9.3 cm and is triple positive with a Ki67 of 45%. she has no other medical problems. Her multidisciplinary medical team met prior to her assessment to deternmine a recommended treatment plan.  She is planning to have a left mastectomy and sentinel node biopsy followed by possible chemotherapy and radiation and anti-estrogen therapy. She will benefit from a post op PT visit to determine PT needs.   History and Personal Factors relevant to plan of care: None   Clinical Presentation Stable   Clinical Decision Making Low   Rehab Potential Excellent   Clinical Impairments Affecting Rehab Potential None   PT Frequency --  Eval and 1 f/u visit    PT Treatment/Interventions ADLs/Self Care Home Management;Therapeutic exercise;Patient/family education   PT Next Visit Plan Will reassess 3-4 weeks post op to determine PT needs   PT Home Exercise Plan Post op shoulder ROM HEP   Consulted and Agree with Plan of Care Patient;Family member/caregiver   Family Member Consulted Husband      Patient will benefit from skilled therapeutic intervention in order to improve the following deficits and impairments:  Decreased range of motion, Impaired UE functional use, Pain, Decreased knowledge of precautions, Postural dysfunction  Visit Diagnosis: Malignant neoplasm of upper-outer quadrant of left breast in female, estrogen receptor positive ([place]) - Plan: PT plan of care cert/re-cert  Abnormal posture - Plan: PT plan of care cert/re-cert   Patient will follow up at outpatient cancer rehab 3-4 weeks following surgery.  If the patient requires physical therapy at that time, a specific plan will be dictated and sent to the referring physician for approval. The patient was educated today on appropriate basic range of motion exercises to begin post  operatively and the importance of attending the After Breast Cancer class following surgery.  Patient was educated today on lymphedema risk reduction practices as it pertains to recommendations that will benefit the patient immediately following surgery.  She verbalized good understanding.      Problem List Patient Active Problem List   Diagnosis Date Noted  . Family history of breast cancer   . Family history of throat cancer   . Malignant neoplasm of upper-outer quadrant of left breast in female, estrogen receptor positive ([place]) 04/19/2017   Annia Friendly, PT 04/25/17 4:56 PM  [place], [place], [address] Phone: [phone]   Fax:  [phone]  Name: Alexis Underwood MRN: [id number] Date of Birth: 10-27-1975

## 2017-04-25 NOTE — Patient Instructions (Signed)

## 2017-04-25 NOTE — Progress Notes (Signed)
Nutrition Assessment  Reason for Assessment:  Pt seen in Breast Clinic  ASSESSMENT:   41 year female with new diagnosis of breast cancer.  Past medical history reviewed  Patient reports normal appetite.   Medications:  reviewed  Labs: reviewed  Anthropometrics:   Height: 61 inches Weight: 140 lb 14.4 oz BMI: 26   NUTRITION DIAGNOSIS: Food and nutrition related knowledge deficit related to new diagnosis of breast cancer as evidenced by no prior need for nutrition related information.  INTERVENTION:   Discussed and provided packet of information regarding nutritional tips for breast cancer patients.  Questions answered.  Teachback method used.  Contact information provided and patient knows to contact me with questions/concerns.    MONITORING, EVALUATION, and GOAL: Pt will consume a healthy plant based diet to maintain lean body mass throughout treatment.   Theophilus Walz B. Zenia Resides, [place], [place] Registered Dietitian [phone] (pager)

## 2017-04-25 NOTE — Progress Notes (Addendum)
Radiation Oncology         [phone]) [phone] ________________________________  Initial outpatient Consultation  Name: Alexis Underwood MRN: [id number]  Date: 04/25/2017  DOB: 08/14/1975  JK:KXFGH, Hal Hope, MD  Jovita Kussmaul, MD   REFERRING PHYSICIAN: Autumn Messing III, MD  DIAGNOSIS:    ICD-10-CM   1. Malignant neoplasm of upper-outer quadrant of left breast in female, estrogen receptor positive ([place]) C50.412    Z17.0     CHIEF COMPLAINT: Here to discuss management of left breast cancer  HISTORY OF PRESENT ILLNESS::Alexis Underwood is a 41 y.o. female who presented with screen detected bilateral breast calcifications (right appear benign), left breast mass with architectural. Left distortion masses with calcification in the upper outer quadrant 9.3 cm and two masses in the breast. On Ultrasound there was 70m mass on 1:00 and 183mat 2:00. The  axilla was negative. 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  Patient denies having any concerns regarding review of systems. Patient does use contacts and glasses.  She is with her husband and she has a 6 33r old daughter.  PREVIOUS RADIATION THERAPY: No  PAST MEDICAL HISTORY:  [place] a [place] of [place]; Family history of throat cancer; and Hyperlipidemia.    PAST SURGICAL HISTORY:No past surgical history on file.  FAMILY HISTORY: family history includes Breast cancer (age of onset: 7549in her paternal grandmother; Heart attack in her mother; Heart disease in her maternal grandfather; Heart failure in her mother; Hyperlipidemia in her mother; Hypertension in her mother; Throat cancer (age of onset: 5579in her paternal grandfather.  SOCIAL HISTORY:  reports that she has never smoked. She has never used smokeless tobacco. She reports that she does  not drink alcohol or use drugs.  ALLERGIES: Patient has no known allergies.  MEDICATIONS:  Current Outpatient Prescriptions  Medication Sig Dispense Refill  . lovastatin (MEVACOR) 20 MG tablet Take 20 mg by mouth at bedtime.    . tamoxifen (NOLVADEX) 20 MG tablet Take 1 tablet (20 mg total) by mouth daily. 30 tablet 0   No current facility-administered medications for this encounter.     REVIEW OF SYSTEMS:  A 10+ POINT REVIEW OF SYSTEMS WAS OBTAINED including neurology, dermatology, psychiatry, cardiac, respiratory, lymph, extremities, GI, GU, Musculoskeletal, constitutional, breasts, reproductive, HEENT.  All pertinent positives are noted in the HPI.  All others are negative.   PHYSICAL EXAM:  Oncology Vitals 04/25/2017  Height 155 cm  Weight 63.912 kg  Weight (lbs) 140 lbs 14 oz  BMI (kg/m2) 26.62 kg/m2  Temp 98.5  Pulse 80  Resp 18  SpO2 100  BSA (m2) 1.66 m2   General: Alert and oriented, in no acute distress HEENT: Head is normocephalic. Extraocular movements are intact. Oropharynx is clear. Neck: Neck is supple, no palpable cervical or supraclavicular lymphadenopathy. Heart: Regular in rate and rhythm with no murmurs, rubs, or gallops. Chest: Clear to auscultation bilaterally, with no rhonchi, wheezes, or rales. Abdomen: Soft, nontender, nondistended, with no rigidity or guarding. Extremities: No cyanosis or edema. Lymphatics: see Neck Exam Skin: No concerning lesions. Musculoskeletal: symmetric strength and muscle tone throughout. Neurologic: Cranial nerves II through XII are grossly intact. No obvious focalities. Speech is fluent. Coordination is intact.  Psychiatric: Judgment and insight are intact. Affect is appropriate. Oral cavities are clear.  Left breast :Post operative bruising 1-6 o'clock at the left breast. Palpable mass that is about 7 cm in the 4 o'clock. No papalble mass in axillary region. Right breast: No palpable mass in breast or axilla   ECOG =  0  0 - Asymptomatic (Fully active, able to carry on all predisease activities without restriction)  1 - Symptomatic but completely ambulatory (Restricted in physically strenuous activity but ambulatory and able to carry out work of a light or sedentary nature. For example, light housework, office work)  2 - Symptomatic, <50% in bed during the day (Ambulatory and capable of all self care but unable to carry out any work activities. Up and about more than 50% of waking hours)  3 - Symptomatic, >50% in bed, but not bedbound (Capable of only limited self-care, confined to bed or chair 50% or more of waking hours)  4 - Bedbound (Completely disabled. Cannot carry on any self-care. Totally confined to bed or chair)  5 - Death   Eustace Pen MM, Creech RH, Tormey DC, et al. [phone]). "Toxicity and response criteria of the [facility]". [place] Oncol. 5 (6): 649-55   LABORATORY DATA:  Lab Results  Component Value Date   WBC 8.9 04/25/2017   HGB 14.0 04/25/2017   HCT 40.2 04/25/2017   MCV 97.5 04/25/2017   PLT 293 04/25/2017   CMP     Component Value Date/Time   NA 140 04/25/2017 1219   K 3.7 04/25/2017 1219   CO2 27 04/25/2017 1219   GLUCOSE 102 04/25/2017 1219   BUN 12.1 04/25/2017 1219   CREATININE 0.9 04/25/2017 1219   CALCIUM 9.1 04/25/2017 1219   PROT 7.2 04/25/2017 1219   ALBUMIN 4.2 04/25/2017 1219   AST 15 04/25/2017 1219   ALT 16 04/25/2017 1219   ALKPHOS 81 04/25/2017 1219   BILITOT 0.42 04/25/2017 1219         RADIOGRAPHY: US Breast Ltd Uni Left Inc Axilla  Result Date: 04/16/2017 CLINICAL DATA:  Patient returns after screening study for evaluation of calcifications in the right breast and 4 calcifications, masses, and distortion in the left breast. EXAM: 2D DIGITAL DIAGNOSTIC BILATERAL MAMMOGRAM WITH CAD AND ADJUNCT TOMO ULTRASOUND BILATERAL BREAST COMPARISON:  04/12/2017, 04/10/2016 ACR Breast Density Category b: There are scattered areas of  fibroglandular density. FINDINGS: Magnified views are performed of calcifications in the upper-outer quadrant of the right breast. These views reveal a loose group of round calcifications without suspicious morphology or distribution. Within this region there are several circumscribed small round masses also further evaluated with ultrasound. Within the upper-outer quadrant of the left breast, there is persistent distortion and a discrete mass further evaluated sonographically. Magnified view show a large area of calcifications. Many calcifications are faint and pleomorphic. Closer to the nipple, other calcifications are more coarse, in a ductal distribution, and demonstrate layering. Area of the calcifications measures 9.3 x 2.5 x 2.3 cm. Mammographic images were processed with CAD. Right breast: On physical exam, I palpate no abnormality in the upper-outer quadrant of the right breast. I palpate no abnormality in the right axilla. Targeted ultrasound is performed, showing numerous small simple cysts within the upper-outer quadrant of the right breast. No suspicious mass is identified. Evaluation of the right axilla is negative for adenopathy. Left breast: On physical exam, I palpate focal thickening in the 2 o'clock location of the left breast. I palpate no abnormality in the lower left axilla. Targeted ultrasound is performed, demonstrating an irregular  hypoechoic mass with associated internal blood flow and internal calcifications. There is posterior acoustic enhancement. Mass measures 1.4 x 0.8 x 1.2 cm. This mass correlates with the focal palpable thickening in the 2 o'clock location 3 cm from nipple. In the 1 o'clock location 5 cm from the nipple, there is an irregular hypoechoic mass measuring 0.8 x 0.8 x 0.8 cm also associated with internal blood flow. These lesions are approximately 4.7 cm apart. In the 3 o'clock periareolar region of the left breast, there are dilated ducts. Visible calcifications are  noted within these ducts, correlating well with the layering calcifications in ductal distribution in this region of mammogram. Evaluation the left axilla is negative for adenopathy. IMPRESSION: 1. Findings compatible with benign fibrocystic changes in the upper-outer quadrant of the right breast. 2. Suspicious masses in the 2 o'clock location of the left breast 3 cm from the nipple. 3. Suspicious mass in the 1 o'clock location of the left breast 5 cm from nipple. 4. 9 cm area of suspicious calcifications in the upper-outer quadrant left breast. RECOMMENDATION: Ultrasound-guided core biopsy of the mass in the 1 o'clock location of the left breast. Ultrasound-guided core biopsy of the mass in the 2 o'clock location of the left breast. Stereotactic guided core biopsy of calcifications in the upper-outer quadrant of the left breast, remote from the other 2 biopsies. I have discussed the findings and recommendations with the patient. Results were also provided in writing at the conclusion of the visit. If applicable, a reminder letter will be sent to the patient regarding the next appointment. BI-RADS CATEGORY  4: Suspicious. Electronically Signed   By: Nolon Nations M.D.   On: 04/16/2017 17:09   US Breast Ltd Uni Right Inc Axilla  Result Date: 04/16/2017 CLINICAL DATA:  Patient returns after screening study for evaluation of calcifications in the right breast and 4 calcifications, masses, and distortion in the left breast. EXAM: 2D DIGITAL DIAGNOSTIC BILATERAL MAMMOGRAM WITH CAD AND ADJUNCT TOMO ULTRASOUND BILATERAL BREAST COMPARISON:  04/12/2017, 04/10/2016 ACR Breast Density Category b: There are scattered areas of fibroglandular density. FINDINGS: Magnified views are performed of calcifications in the upper-outer quadrant of the right breast. These views reveal a loose group of round calcifications without suspicious morphology or distribution. Within this region there are several circumscribed small round  masses also further evaluated with ultrasound. Within the upper-outer quadrant of the left breast, there is persistent distortion and a discrete mass further evaluated sonographically. Magnified view show a large area of calcifications. Many calcifications are faint and pleomorphic. Closer to the nipple, other calcifications are more coarse, in a ductal distribution, and demonstrate layering. Area of the calcifications measures 9.3 x 2.5 x 2.3 cm. Mammographic images were processed with CAD. Right breast: On physical exam, I palpate no abnormality in the upper-outer quadrant of the right breast. I palpate no abnormality in the right axilla. Targeted ultrasound is performed, showing numerous small simple cysts within the upper-outer quadrant of the right breast. No suspicious mass is identified. Evaluation of the right axilla is negative for adenopathy. Left breast: On physical exam, I palpate focal thickening in the 2 o'clock location of the left breast. I palpate no abnormality in the lower left axilla. Targeted ultrasound is performed, demonstrating an irregular hypoechoic mass with associated internal blood flow and internal calcifications. There is posterior acoustic enhancement. Mass measures 1.4 x 0.8 x 1.2 cm. This mass correlates with the focal palpable thickening in the 2 o'clock location 3 cm from nipple. In  the 1 o'clock location 5 cm from the nipple, there is an irregular hypoechoic mass measuring 0.8 x 0.8 x 0.8 cm also associated with internal blood flow. These lesions are approximately 4.7 cm apart. In the 3 o'clock periareolar region of the left breast, there are dilated ducts. Visible calcifications are noted within these ducts, correlating well with the layering calcifications in ductal distribution in this region of mammogram. Evaluation the left axilla is negative for adenopathy. IMPRESSION: 1. Findings compatible with benign fibrocystic changes in the upper-outer quadrant of the right breast. 2.  Suspicious masses in the 2 o'clock location of the left breast 3 cm from the nipple. 3. Suspicious mass in the 1 o'clock location of the left breast 5 cm from nipple. 4. 9 cm area of suspicious calcifications in the upper-outer quadrant left breast. RECOMMENDATION: Ultrasound-guided core biopsy of the mass in the 1 o'clock location of the left breast. Ultrasound-guided core biopsy of the mass in the 2 o'clock location of the left breast. Stereotactic guided core biopsy of calcifications in the upper-outer quadrant of the left breast, remote from the other 2 biopsies. I have discussed the findings and recommendations with the patient. Results were also provided in writing at the conclusion of the visit. If applicable, a reminder letter will be sent to the patient regarding the next appointment. BI-RADS CATEGORY  4: Suspicious. Electronically Signed   By: Nolon Nations M.D.   On: 04/16/2017 17:09   Mm Diag Breast Tomo Bilateral  Result Date: 04/16/2017 CLINICAL DATA:  Patient returns after screening study for evaluation of calcifications in the right breast and 4 calcifications, masses, and distortion in the left breast. EXAM: 2D DIGITAL DIAGNOSTIC BILATERAL MAMMOGRAM WITH CAD AND ADJUNCT TOMO ULTRASOUND BILATERAL BREAST COMPARISON:  04/12/2017, 04/10/2016 ACR Breast Density Category b: There are scattered areas of fibroglandular density. FINDINGS: Magnified views are performed of calcifications in the upper-outer quadrant of the right breast. These views reveal a loose group of round calcifications without suspicious morphology or distribution. Within this region there are several circumscribed small round masses also further evaluated with ultrasound. Within the upper-outer quadrant of the left breast, there is persistent distortion and a discrete mass further evaluated sonographically. Magnified view show a large area of calcifications. Many calcifications are faint and pleomorphic. Closer to the nipple,  other calcifications are more coarse, in a ductal distribution, and demonstrate layering. Area of the calcifications measures 9.3 x 2.5 x 2.3 cm. Mammographic images were processed with CAD. Right breast: On physical exam, I palpate no abnormality in the upper-outer quadrant of the right breast. I palpate no abnormality in the right axilla. Targeted ultrasound is performed, showing numerous small simple cysts within the upper-outer quadrant of the right breast. No suspicious mass is identified. Evaluation of the right axilla is negative for adenopathy. Left breast: On physical exam, I palpate focal thickening in the 2 o'clock location of the left breast. I palpate no abnormality in the lower left axilla. Targeted ultrasound is performed, demonstrating an irregular hypoechoic mass with associated internal blood flow and internal calcifications. There is posterior acoustic enhancement. Mass measures 1.4 x 0.8 x 1.2 cm. This mass correlates with the focal palpable thickening in the 2 o'clock location 3 cm from nipple. In the 1 o'clock location 5 cm from the nipple, there is an irregular hypoechoic mass measuring 0.8 x 0.8 x 0.8 cm also associated with internal blood flow. These lesions are approximately 4.7 cm apart. In the 3 o'clock periareolar region of the  left breast, there are dilated ducts. Visible calcifications are noted within these ducts, correlating well with the layering calcifications in ductal distribution in this region of mammogram. Evaluation the left axilla is negative for adenopathy. IMPRESSION: 1. Findings compatible with benign fibrocystic changes in the upper-outer quadrant of the right breast. 2. Suspicious masses in the 2 o'clock location of the left breast 3 cm from the nipple. 3. Suspicious mass in the 1 o'clock location of the left breast 5 cm from nipple. 4. 9 cm area of suspicious calcifications in the upper-outer quadrant left breast. RECOMMENDATION: Ultrasound-guided core biopsy of the  mass in the 1 o'clock location of the left breast. Ultrasound-guided core biopsy of the mass in the 2 o'clock location of the left breast. Stereotactic guided core biopsy of calcifications in the upper-outer quadrant of the left breast, remote from the other 2 biopsies. I have discussed the findings and recommendations with the patient. Results were also provided in writing at the conclusion of the visit. If applicable, a reminder letter will be sent to the patient regarding the next appointment. BI-RADS CATEGORY  4: Suspicious. Electronically Signed   By: Nolon Nations M.D.   On: 04/16/2017 17:09   Mm Screening Breast Tomo Bilateral  Result Date: 04/12/2017 CLINICAL DATA:  Screening. EXAM: 2D DIGITAL SCREENING BILATERAL MAMMOGRAM WITH CAD AND ADJUNCT TOMO COMPARISON:  Previous exam(s). ACR Breast Density Category c: The breast tissue is heterogeneously dense, which may obscure small masses. FINDINGS: In the right breast calcifications require further evaluation. In the left breast calcifications, a possible mass, and possible architectural distortion require further evaluation. The possible mass and possible distortion are annotated on the tomographic images. The calcifications are annotated on the 2D images. Images were processed with CAD. IMPRESSION: Further evaluation is suggested for possible calcifications in the right breast. Further evaluation is suggested for possible calcifications, mask, and architectural distortion in the left breast. RECOMMENDATION: Diagnostic mammogram and possibly ultrasound of both breasts. (Code:[id number]) The patient will be contacted regarding the findings, and additional imaging will be scheduled. BI-RADS CATEGORY  0: Incomplete. Need additional imaging evaluation and/or prior mammograms for comparison. Electronically Signed   By: Curlene Dolphin M.D.   On: 04/12/2017 16:44   Mm Clip Placement Left  Result Date: 04/16/2017 CLINICAL DATA:  Three left breast biopsies were  performed today. These include 2 ultrasound-guided biopsies of masses in the upper outer quadrant and a stereotactic biopsy of calcifications in the retroareolar left breast. EXAM: DIAGNOSTIC LEFT MAMMOGRAM POST ULTRASOUND AND STEREOTACTIC BIOPSIES COMPARISON:  Previous exam(s). FINDINGS: Mammographic images were obtained following ultrasound guided biopsy of a left breast mass at 1 o'clock position, ultrasound-guided biopsy of a left breast mass at 2 o'clock position and retroareolar calcifications. A ribbon shaped biopsy clip is satisfactorily positioned within an area of architectural distortion/mass in the upper-outer quadrant of the left breast. A coil shaped biopsy clip is satisfactorily positioned in a mass in the upper-outer quadrant of the left breast. An X shaped biopsy clip is satisfactorily positioned along the anterior aspect of suspicious retroareolar left breast calcifications. IMPRESSION: Satisfactory position of 3 biopsy clips as described above. Final Assessment: Post Procedure Mammograms for Marker Placement Electronically Signed   By: Curlene Dolphin M.D.   On: 04/16/2017 17:13   Mm Lt Breast Bx W Loc Dev 1st Lesion Image Bx Spec Stereo Guide  Addendum Date: 04/18/2017   ADDENDUM REPORT: 04/18/2017 08:52 ADDENDUM: Pathology revealed grade II to III invasive ductal carcinoma and ductal carcinoma in situ  with necrosis in the LEFT BREAST at 1:00 (ribbon clip). Pathology revealed ductal carcinoma in situ, suspicious for microscopic invasion in the LEFT BREAST at 2:00 (coil clip). 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 This was found to be concordant by Dr. Britta Mccreedy. Pathology results were discussed with the patient and her husband at [facility]. The patient did well after the biopsies with tenderness at the site. There was no bruising or hematoma present. Post biopsy instructions and care were reviewed and questions  were answered. The patient was encouraged to call [facility] for any additional concerns. The patient was referred to [facility] at the [facility] on April 25, 2017. Bilateral breast MRI is recommended. Pathology results reported by Sonnie Alamo RN, BSN on 04/18/2017. Electronically Signed   By: Britta Mccreedy M.D.   On: 04/18/2017 08:52   Result Date: 04/18/2017 CLINICAL DATA:  Stereotactic biopsy of suspicious calcifications in the left breast was recommended. EXAM: LEFT BREAST STEREOTACTIC CORE NEEDLE BIOPSY COMPARISON:  Previous exams. FINDINGS: The patient and I discussed the procedure of stereotactic-guided biopsy including benefits and alternatives. We discussed the high likelihood of a successful procedure. We discussed the risks of the procedure including infection, bleeding, tissue injury, clip migration, and inadequate sampling. Informed written consent was given. The usual time out protocol was performed immediately prior to the procedure. Using sterile technique and 1% Lidocaine as local anesthetic, under stereotactic guidance, a 9 gauge vacuum assisted device was used to perform core needle biopsy of calcifications in the retroareolar left breast, slightly lateral to the nipple using a superior to inferior approach. Specimen radiograph was performed showing 2 visible calcifications. Specimens with calcifications are identified for pathology. Lesion quadrant: Lower outer quadrant At the conclusion of the procedure, a X shaped tissue marker clip was deployed into the biopsy cavity. Follow-up 2-view mammogram was performed and dictated separately. IMPRESSION: Stereotactic-guided biopsy of the left breast. No apparent complications. Electronically Signed: By: Britta Mccreedy M.D. On: 04/16/2017 17:05   [place] Lt Breast Bx W Loc Dev 1st Lesion Img Bx Spec US Guide  Addendum Date: 04/18/2017   ADDENDUM REPORT: 04/18/2017  08:53 ADDENDUM: Pathology revealed grade II to III invasive ductal carcinoma and ductal carcinoma in situ with necrosis in the LEFT BREAST at 1:00 (ribbon clip). Pathology revealed ductal carcinoma in situ, suspicious for microscopic invasion in the LEFT BREAST at 2:00 (coil clip). 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 This was found to be concordant by Dr. Britta Mccreedy. Pathology results were discussed with the patient and her husband at [facility]. The patient did well after the biopsies with tenderness at the site. There was no bruising or hematoma present. Post biopsy instructions and care were reviewed and questions were answered. The patient was encouraged to call [facility] for any additional concerns. The patient was referred to the [facility] at the [facility] on April 25, 2017. Bilateral breast MRI is recommended. Pathology results reported by Sonnie Alamo RN, BSN on 04/18/2017. Electronically Signed   By: Britta Mccreedy M.D.   On: 04/18/2017 08:53   Result Date: 04/18/2017 CLINICAL DATA:  Two ultrasound-guided biopsies are recommended of suspicious masses in the left breast. This report describes the biopsy of the mass at 1 o'clock position  in the left breast approximately 5 cm from the nipple. EXAM: ULTRASOUND GUIDED LEFT BREAST CORE NEEDLE BIOPSY COMPARISON:  Previous exam(s). FINDINGS: I met with the patient and we discussed the procedure of ultrasound-guided biopsy, including benefits and alternatives. We discussed the high likelihood of a successful procedure. We discussed the risks of the procedure, including infection, bleeding, tissue injury, clip migration, and inadequate sampling. Informed written consent was given. The usual time-out protocol was performed immediately prior to the procedure. Lesion quadrant: Upper outer quadrant Using  sterile technique and 1% Lidocaine as local anesthetic, under direct ultrasound visualization, a 12 gauge spring-loaded device was used to perform biopsy of an irregular mass at 1 o'clock position 5 cm from the nipple using a lateral to medial approach. At the conclusion of the procedure a ribbon tissue marker clip was deployed into the biopsy cavity. Follow up 2 view mammogram was performed and dictated separately. IMPRESSION: Ultrasound guided biopsy of the left breast at 1 o'clock position. No apparent complications. Electronically Signed: By: Britta Mccreedy M.D. On: 04/16/2017 17:08   [place] Lt Breast Bx W Loc Dev Ea Add Lesion Img Bx Spec US Guide  Addendum Date: 04/18/2017   ADDENDUM REPORT: 04/18/2017 08:53 ADDENDUM: Pathology revealed grade II to III invasive ductal carcinoma and ductal carcinoma in situ with necrosis in the LEFT BREAST at 1:00 (ribbon clip). Pathology revealed ductal carcinoma in situ, suspicious for microscopic invasion in the LEFT BREAST at 2:00 (coil clip). 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 This was found to be concordant by Dr. Britta Mccreedy. Pathology results were discussed with the patient and her husband at [facility]. The patient did well after the biopsies with tenderness at the site. There was no bruising or hematoma present. Post biopsy instructions and care were reviewed and questions were answered. The patient was encouraged to call [facility] for any additional concerns. The patient was referred to the [facility] at the [facility] on April 25, 2017. Bilateral breast MRI is recommended. Pathology results reported by Sonnie Alamo RN, BSN on 04/18/2017. Electronically Signed   By: Britta Mccreedy M.D.   On: 04/18/2017 08:53   Result Date: 04/18/2017 CLINICAL DATA:  Two ultrasound-guided biopsies are recommended of  suspicious masses in the left breast. This describes a biopsy at 2 o'clock position approximately 3 cm from the nipple. EXAM: ULTRASOUND GUIDED LEFT BREAST CORE NEEDLE BIOPSY COMPARISON:  Previous exam(s). FINDINGS: I met with the patient and we discussed the procedure of ultrasound-guided biopsy, including benefits and alternatives. We discussed the high likelihood of a successful procedure. We discussed the risks of the procedure, including infection, bleeding, tissue injury, clip migration, and inadequate sampling. Informed written consent was given. The usual time-out protocol was performed immediately prior to the procedure. Lesion quadrant: Upper outer quadrant Using sterile technique and 1% Lidocaine as local anesthetic, under direct ultrasound visualization, a 12 gauge spring-loaded device was used to perform biopsy of mass 2 o'clock position 3 cm from the nipple using a lateral to medial approach. At the conclusion of the procedure a coil tissue marker clip was deployed into the biopsy cavity. Follow up 2 view mammogram was performed and dictated separately. IMPRESSION: Ultrasound guided biopsy of the left breast at 2 o'clock position. No apparent complications. Electronically Signed: By: Britta Mccreedy M.D. On: 04/16/2017 17:09      IMPRESSION/PLAN: Left breast cancer.  Consensus was that she should undergo left mastectomy, with lymph node biopsy. She should also  Undergo Systemic adjuvant  therapy. Radiotherapy would only be given if  the extent of the disease is much more than anticipated, or margins are positive or if the lymph nodes are positive.   Today, I talked to the patient about the findings and work-up thus far. We discussed the patient's diagnosis of left breast cancer and general treatment for this, highlighting the role of radiotherapy in the management. We discussed the available radiation techniques, and focused on the details of logistics and delivery.    We discussed the  risks, benefits, and side effects of radiotherapy if she ends up needing it, though I hope she will not. Side effects may include but not necessarily be limited to: skin irritation, fatigue. No guarantees of treatment were given. The patient was encouraged to ask questions that I answered to the best of my ability.  I wished her the best and will see her PRN.   __________________________________________   Eppie Gibson, MD  This document serves as a record of services personally performed by Eppie Gibson MD. It was created on her behalf by Delton Coombes, a trained medical scribe. The creation of this record is based on the scribe's personal observations and the provider's statements to them. This document has been checked and approved by the attending provider.

## 2017-04-25 NOTE — Progress Notes (Signed)
[place] CONSULT NOTE  Patient Care Team: Carol Ada, MD as PCP - General (Family Medicine)  CHIEF COMPLAINTS/PURPOSE OF CONSULTATION:  Newly diagnosed breast cancer  HISTORY OF PRESENTING ILLNESS:  Alexis Underwood 41 y.o. female is here because of recent diagnosis of eft breast cancer. Patient had a screening mammogram that detected extensive calcifications in the left breast upper outer quadrant extending to the nipple measuring 9.3 cm. On ultrasound there were couple of areas of her abnormal at 12:00 position 1.4 cm and 1:00 position 8 mm. 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 Biopsy of the 12:00 mass and remainder of the calcifications came back as DCIS with suspicion for microinvasion.She did not have enlarged axillary lymph nodes.She was presented this morning in the multidisciplinary tumor board and she is here today to discuss the treatment plan. She is accompanied by her husband. 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  I reviewed her records extensively and collaborated the history with the patient.  SUMMARY OF ONCOLOGIC HISTORY:   Malignant neoplasm of upper-outer quadrant of left breast in female, estrogen receptor positive ([place])   04/16/2017 Initial Diagnosis    Screening mammogram detected calcifications left breast UOQ 9.3 cm, at 2:00: 1.4 cm and at 1:00: 8 mm; biopsy of mass at 1:00: IDC grade 2 with DCIS ER 95%, PR 90%,Ki-67 45%, HER-2 positive ratio 5.18; iopsy of the 2:00 mass and calcifications were DCIS with suspicion for microinvasion, T1 cN0 stage IA clinical stage AJCC 8       MEDICAL HISTORY:  Past Medical History:  Diagnosis Date  . Hyperlipidemia     SURGICAL HISTORY: History reviewed. No pertinent surgical history.  SOCIAL HISTORY: Social History   Social History  . Marital status: Married    Spouse  name: N/A  . Number of children: N/A  . Years of education: N/A   Occupational History  . Not on file.   Social History Main Topics  . Smoking status: Never Smoker  . Smokeless tobacco: Never Used  . Alcohol use No  . Drug use: No  . Sexual activity: Not on file   Other Topics Concern  . Not on file   Social History Narrative  . No narrative on file    FAMILY HISTORY: Family History  Problem Relation Age of Onset  . Heart attack Mother   . Hyperlipidemia Mother   . Hypertension Mother   . Heart failure Mother   . Breast cancer Paternal Grandmother     ALLERGIES:  has No Known Allergies.  MEDICATIONS:  Current Outpatient Prescriptions  Medication Sig Dispense Refill  . lovastatin (MEVACOR) 20 MG tablet Take 20 mg by mouth at bedtime.    . tamoxifen (NOLVADEX) 20 MG tablet Take 1 tablet (20 mg total) by mouth daily. 30 tablet 0   No current facility-administered medications for this visit.     REVIEW OF SYSTEMS:   Constitutional: Denies fevers, chills or abnormal night sweats Eyes: Denies blurriness of vision, double vision or watery eyes Ears, nose, mouth, throat, and face: Denies mucositis or sore throat Respiratory: Denies cough, dyspnea or wheezes Cardiovascular: Denies palpitation, chest discomfort or lower extremity swelling Gastrointestinal:  Denies nausea, heartburn or change in bowel habits Skin: Denies abnormal skin rashes Lymphatics: Denies new lymphadenopathy or easy bruising Neurological:Denies numbness, tingling or new weaknesses Behavioral/Psych: Mood is stable, no new  changes  Breast:  Denies any palpable lumps or discharge All other systems were reviewed with the patient and are negative.  PHYSICAL EXAMINATION: ECOG PERFORMANCE STATUS: 1 - Symptomatic but completely ambulatory  Vitals:   04/25/17 1255  BP: (!) 156/94  Pulse: 80  Resp: 18  Temp: 98.5 F (36.9 C)  SpO2: 100%   Filed Weights   04/25/17 1255  Weight: 140 lb 14.4 oz  (63.9 kg)    GENERAL:alert, no distress and comfortable SKIN: skin color, texture, turgor are normal, no rashes or significant lesions EYES: normal, conjunctiva are pink and non-injected, sclera clear OROPHARYNX:no exudate, no erythema and lips, buccal mucosa, and tongue normal  NECK: supple, thyroid normal size, non-tender, without nodularity LYMPH:  no palpable lymphadenopathy in the cervical, axillary or inguinal LUNGS: clear to auscultation and percussion with normal breathing effort HEART: regular rate & rhythm and no murmurs and no lower extremity edema ABDOMEN:abdomen soft, non-tender and normal bowel sounds Musculoskeletal:no cyanosis of digits and no clubbing  PSYCH: alert & oriented x 3 with fluent speech NEURO: no focal motor/sensory deficits BREAST: No palpable nodules in breast. No palpable axillary or supraclavicular lymphadenopathy (exam performed in the presence of a chaperone)   LABORATORY DATA:  I have reviewed the data as listed Lab Results  Component Value Date   WBC 8.9 04/25/2017   HGB 14.0 04/25/2017   HCT 40.2 04/25/2017   MCV 97.5 04/25/2017   PLT 293 04/25/2017   Lab Results  Component Value Date   NA 140 04/25/2017   K 3.7 04/25/2017   CO2 27 04/25/2017    RADIOGRAPHIC STUDIES: I have personally reviewed the radiological reports and agreed with the findings in the report.  ASSESSMENT AND PLAN:  Malignant neoplasm of upper-outer quadrant of left breast in female, estrogen receptor positive ([place]) 04/16/2017: Screening mammogram detected calcifications left breast UOQ 9.3 cm, at 2:00: 1.4 cm and at 1:00: 8 mm; biopsy of mass at 1:00: IDC grade 2 with DCIS ER 95%, PR 90%,Ki-67 45%, HER-2 positive ratio 5.18; iopsy of the 2:00 mass and calcifications were DCIS with suspicion for microinvasion, T1 cN0 stage IA clinical stage AJCC 8  Pathology and radiology counseling: Discussed with the patient, the details of pathology including the type of breast  cancer,the clinical staging, the significance of ER, PR and HER-2/neu receptors and the implications for treatment. After reviewing the pathology in detail, we proceeded to discuss the different treatment options between surgery, radiation, chemotherapy, antiestrogen therapies.  Recommendation: 1. Mastectomy 2. Followed by adjuvant chemotherapy (depending on the final tumor size systemic chemotherapy will be decided between Taxol Herceptin versus TCH versus TCHP) 3. Followed by antiestrogen therapy with tamoxifen 20 mg daily 10 years 4. Genetics consult  Adjuvant chemotherapy would depend on the final tumor size. She may be able to receive Taxol Herceptin weekly 12 followed by Herceptin maintenance for 1 year Because she will have genetics testing, I recommended that she start antiestrogen therapy with tamoxifen 20 mg daily starting today.  Tamoxifen counseling:We discussed the risks and benefits of tamoxifen. These include but not limited to insomnia, hot flashes, mood changes, vaginal dryness, and weight gain. Although rare, serious side effects including endometrial cancer, risk of blood clots were also discussed. We strongly believe that the benefits far outweigh the risks. Patient understands these risks and consented to starting treatment.  All questions were answered. The patient knows to call the clinic with any problems, questions or concerns.    Rulon Eisenmenger, MD  04/25/17  

## 2017-04-25 NOTE — Assessment & Plan Note (Signed)
04/16/2017: Screening mammogram detected calcifications left breast UOQ 9.3 cm, at 2:00: 1.4 cm and at 1:00: 8 mm; biopsy of mass at 1:00: IDC grade 2 with DCIS ER 95%, PR 90%,Ki-67 45%, HER-2 positive ratio 5.18; iopsy of the 2:00 mass and calcifications were DCIS with suspicion for microinvasion, T1 cN0 stage IA clinical stage AJCC 8  Pathology and radiology counseling: Discussed with the patient, the details of pathology including the type of breast cancer,the clinical staging, the significance of ER, PR and HER-2/neu receptors and the implications for treatment. After reviewing the pathology in detail, we proceeded to discuss the different treatment options between surgery, radiation, chemotherapy, antiestrogen therapies.  Recommendation: 1. Mastectomy 2. Followed by adjuvant chemotherapy 3. Followed by antiestrogen therapy with tamoxifen 20 mg daily 10 years 4. Genetics consult  Adjuvant chemotherapy would depend on the final tumor size. She may be able to receive Taxol Herceptin weekly 12 followed by Herceptin maintenance for 1 year

## 2017-04-27 ENCOUNTER — Encounter: Payer: Self-pay | Admitting: General Practice

## 2017-04-27 NOTE — Progress Notes (Signed)
[place] Psychosocial Distress Screening Spiritual Care  LVM for Mame following Breast [facility]isciplinary Clinic to introduce [place] team/resources, reviewing distress screen per protocol, in order to assess for distress and other psychosocial needs.  The patient scored a 3 on the Psychosocial Distress Thermometer which indicates mild distress.   ONCBCN DISTRESS SCREENING 04/27/2017  Screening Type Initial Screening  Distress experienced in past week (1-10) 3  Practical problem type Work/school  Referral to support programs Yes    Follow up needed: No. LVM encouraging pt to return call.  She has full packet of [place] team/programming information.  As always, please also page if immediate needs arise or circumstances change.  Thank you.   [place], [place], [facility] Pager [phone] Voicemail [phone]

## 2017-05-01 ENCOUNTER — Telehealth: Payer: Self-pay | Admitting: *Deleted

## 2017-05-01 NOTE — Telephone Encounter (Signed)
  Oncology Nurse Navigator Documentation  Navigator Location: [facility] (05/01/17 1200)   )Navigator Encounter Type: [facility] Follow-up (05/01/17 1200) Telephone: Outgoing Call;Clinic/[facility]C Follow-up (05/01/17 1200)       Genetic Counseling Date: 04/25/17 (05/01/17 1200) Genetic Counseling Type: Urgent (05/01/17 1200)       Treatment Initiated Date: 04/25/17 (05/01/17 1200)                                Time Spent with Patient: 15 (05/01/17 1200)

## 2017-05-10 ENCOUNTER — Telehealth: Payer: Self-pay | Admitting: Genetics

## 2017-05-10 ENCOUNTER — Ambulatory Visit: Payer: Self-pay | Admitting: General Surgery

## 2017-05-10 DIAGNOSIS — Z170 Estrogen receptor positive status [ER+]: Principal | ICD-10-CM

## 2017-05-10 DIAGNOSIS — C50412 Malignant neoplasm of upper-outer quadrant of left female breast: Secondary | ICD-10-CM

## 2017-05-10 NOTE — Telephone Encounter (Signed)
Revealed negative genetic testing.  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   This normal result is reassuring and indicates that it is unlikely Alexis Underwood's cancer is due to a hereditary cause.  It is unlikely that there is an increased risk of another cancer due to a mutation in one of these genes.  However, genetic testing is not perfect, and cannot definitively rule out a hereditary cause.  It will be important for her to keep in contact with genetics to learn if any additional testing may be needed in the future.     Her daughter will  Need to let her doctors know about the family history of breast cancer when she is older and is recommended to have mammograms starting by age 78.

## 2017-05-15 ENCOUNTER — Encounter: Payer: Self-pay | Admitting: Genetics

## 2017-05-18 ENCOUNTER — Telehealth: Payer: Self-pay | Admitting: Hematology and Oncology

## 2017-05-18 NOTE — Telephone Encounter (Signed)
Scheduled appt per 11/7 sch message - sent reminder letter in the mail with appt date and time.

## 2017-05-21 ENCOUNTER — Ambulatory Visit: Payer: Self-pay | Admitting: Genetics

## 2017-05-21 DIAGNOSIS — C50412 Malignant neoplasm of upper-outer quadrant of left female breast: Secondary | ICD-10-CM

## 2017-05-21 DIAGNOSIS — Z800 Family history of malignant neoplasm of digestive organs: Secondary | ICD-10-CM

## 2017-05-21 DIAGNOSIS — Z1379 Encounter for other screening for genetic and chromosomal anomalies: Secondary | ICD-10-CM

## 2017-05-21 DIAGNOSIS — Z170 Estrogen receptor positive status [ER+]: Principal | ICD-10-CM

## 2017-05-21 DIAGNOSIS — Z803 Family history of malignant neoplasm of breast: Secondary | ICD-10-CM

## 2017-05-21 NOTE — Progress Notes (Signed)
HPI: Alexis Underwood was previously seen in the [place] clinic on 04/25/2017 due to a personal and family history of breast cancer and concerns regarding a hereditary predisposition to cancer. Please refer to our prior cancer genetics clinic note for more information regarding Alexis Underwood's medical, social and family histories, and our assessment and recommendations, at the time. Alexis Underwood recent genetic test results were disclosed to her, as well as recommendations warranted by these results. These results and recommendations are discussed in more detail below.  CANCER HISTORY:    Malignant neoplasm of upper-outer quadrant of left breast in female, estrogen receptor positive ([place])   04/16/2017 Initial Diagnosis    Screening mammogram detected calcifications left breast UOQ 9.3 cm, at 2:00: 1.4 cm and at 1:00: 8 mm; biopsy of mass at 1:00: IDC grade 2 with DCIS ER 95%, PR 90%,Ki-67 45%, HER-2 positive ratio 5.18; iopsy of the 2:00 mass and calcifications were DCIS with suspicion for microinvasion, T1 cN0 stage IA clinical stage AJCC 8      05/09/2017 Genetic Testing    The patient had genetic testing due to a personal and family history of breast cancer.  The Common Hereditary Cancer Panel was ordered.  The Hereditary Gene Panel offered by [facility] includes sequencing and/or deletion duplication testing of the following 47 genes: APC, ATM, AXIN2, BARD1, BMPR1A, BRCA1, BRCA2, BRIP1, CDH1, CDKN2A (p14ARF), CDKN2A (p16INK4a), CKD4, CHEK2, CTNNA1, DICER1, EPCAM (Deletion/duplication testing only), GREM1 (promoter region deletion/duplication testing only), KIT, MEN1, MLH1, MSH2, MSH3, MSH6, MUTYH, NBN, NF1, NHTL1, PALB2, PDGFRA, PMS2, POLD1, POLE, PTEN, RAD50, RAD51C, RAD51D, SDHB, SDHC, SDHD, SMAD4, SMARCA4. STK11, TP53, TSC1, TSC2, and VHL.  The following genes were evaluated for sequence changes only: SDHA and HOXB13 c.251G>A variant only.  Results- No pathogenic variants identified.  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  The date of this test report is 05/09/2017.         FAMILY HISTORY:  We obtained a detailed, 4-generation family history.  Significant diagnoses are listed below: Family History  Problem Relation Age of Onset  . Heart attack Mother   . Hyperlipidemia Mother   . Hypertension Mother   . Heart failure Mother   . Breast cancer Paternal Grandmother 53  . Heart disease Maternal Grandfather        died in 39's  . Throat cancer Paternal Grandfather 40   Alexis Underwood has a 20 year old daughter.  Alexis Underwood has a 60 year-old brother with no history of cancer.  He has a son and a daughter in their teens with no history of cancer.   Alexis Underwood father died at the age of 78 with no history of cancer.  He had 2 brothers.  One of these uncles died in his late 29's without any history of cancer and has 2 sons in their 51's/40's.  Alexis Underwood other uncle is currently in his 78's with no history of cancer.  He has a daughter in her late 20's/early 51's with no history of cancer.  Alexis Underwood paternal grandfather had throat cancer in his 36's/60's and died in his 56's/70's.  Alexis Underwood paternal grandmother was diagnosed with breast cancer at 64/41 years of age and died in her 79's/80's.   Alexis Underwood mother is 26 with no history of cancer.  Alexis Underwood has 1 maternal uncle who is in his 72's with no history of cancer and has no children.  Alexis Underwood  maternal grandfather died in his 48's from heart disease, and she does not know any information about her maternal grandmother.   Alexis Underwood is is unaware of previous family history of genetic testing for hereditary cancer risks. Patient's maternal ancestors are of Caucasian descent, and paternal ancestors are of Caucasian descent. There is no  reported Ashkenazi Jewish ancestry. There is no known consanguinity.  GENETIC TEST RESULTS: Genetic testing  performed through [facility]'s Common Hereditary Cancer Panel reported out on 05/09/2017 showed no pathogenic mutations. The Hereditary Gene Panel offered by [facility] includes sequencing and/or deletion duplication testing of the following 47 genes: APC, ATM, AXIN2, BARD1, BMPR1A, BRCA1, BRCA2, BRIP1, CDH1, CDKN2A (p14ARF), CDKN2A (p16INK4a), CKD4, CHEK2, CTNNA1, DICER1, EPCAM (Deletion/duplication testing only), GREM1 (promoter region deletion/duplication testing only), KIT, MEN1, MLH1, MSH2, MSH3, MSH6, MUTYH, NBN, NF1, NHTL1, PALB2, PDGFRA, PMS2, POLD1, POLE, PTEN, RAD50, RAD51C, RAD51D, SDHB, SDHC, SDHD, SMAD4, SMARCA4. STK11, TP53, TSC1, TSC2, and VHL.  The following genes were evaluated for sequence changes only: SDHA and HOXB13 c.251G>A variant only..  A variant of uncertain significance (VUS) in a gene called APC was also noted. c.-30626C>G (Promoter 1B)  The test report will be scanned into EPIC and will be located under the Molecular Pathology section of the Results Review tab.A portion of the result report is included below for reference.     We discussed with Alexis Underwood that because current genetic testing is not perfect, it is possible there may be a gene mutation in one of these genes that current testing cannot detect, but that chance is small. We also discussed, that there could be another gene that has not yet been discovered, or that we have not yet tested, that is responsible for the cancer diagnoses in the family. Therefore, it is important to remain in touch with cancer genetics in the future so that we can continue to offer Alexis Underwood the most up to date genetic testing.   Regarding the VUS in APC: At this time, it is unknown if this variant is associated with increased cancer risk or if this is a normal finding, but most variants such as this get reclassified to being inconsequential. It should not be used to make medical management decisions. With time, we suspect the lab will  determine the significance of this variant, if any. If we do learn more about it, we will try to contact Alexis Underwood to discuss it further. However, it is important to stay in touch with [place] periodically and keep the address and phone number up to date.  ADDITIONAL GENETIC TESTING: We discussed with Alexis Underwood that there are other genes that are associated with increased cancer risk that can be analyzed. The laboratories that offer this testing look at these additional genes via a hereditary cancer gene panel. Should Alexis Underwood wish to pursue additional genetic testing, we are happy to discuss and coordinate this testing, at any time.    CANCER SCREENING RECOMMENDATIONS: This normal result indicates that it is unlikely Alexis Underwood has an increased risk of cancer due to a mutation in one of these genes.  However, genetic testing is not perfect and cannot definitively rule out a hereditary cause for her cancer.  Therefore, Alexis Underwood. [place] was advised to continue following the cancer screening guidelines provided by her primary healthcare providers. Other factors such as her personal and family history may still affect her cancer risk.    RECOMMENDATIONS FOR FAMILY MEMBERS: Women in this family might be at some increased risk  of developing cancer, over the general population risk, simply due to the family history of cancer. We recommended women in this family have a yearly mammogram beginning at age 63, or 69 years younger than the earliest onset of cancer, an annual clinical breast exam, and perform monthly breast self-exams. We recommend Alexis Underwood. [place]'s daughter have annual mammograms starting by age 34 and inform her doctors about the family history of cancer so they can make the most appropriate recommendations for her.  Other women in the family should also inform their doctors about the family history of cancer. Women in this family should also have a gynecological exam as recommended by their primary  provider. All family members should have a colonoscopy by age 83.  FOLLOW-UP: Lastly, we discussed with Alexis Underwood. [place] that cancer genetics is a rapidly advancing field and it is possible that new genetic tests will be appropriate for her and/or her family members in the future. We encouraged her to remain in contact with cancer genetics on an annual basis so we can update her personal and family histories and let her know of advances in cancer genetics that may benefit this family.   Our contact number was provided. Alexis Underwood. [place] questions were answered to her satisfaction, and she knows she is welcome to call us at anytime with additional questions or concerns.   Ferol Luz, Alexis Underwood Genetic Counselor [email or web address]

## 2017-05-23 ENCOUNTER — Other Ambulatory Visit: Payer: Self-pay | Admitting: *Deleted

## 2017-05-23 DIAGNOSIS — C50412 Malignant neoplasm of upper-outer quadrant of left female breast: Secondary | ICD-10-CM

## 2017-05-23 DIAGNOSIS — Z170 Estrogen receptor positive status [ER+]: Principal | ICD-10-CM

## 2017-05-29 ENCOUNTER — Other Ambulatory Visit: Payer: Self-pay | Admitting: *Deleted

## 2017-05-29 ENCOUNTER — Telehealth: Payer: Self-pay | Admitting: *Deleted

## 2017-05-29 ENCOUNTER — Ambulatory Visit
Admission: RE | Admit: 2017-05-29 | Discharge: 2017-05-29 | Disposition: A | Discharge status: Home / Self Care | Payer: BLUE CROSS/BLUE SHIELD | Source: Ambulatory Visit | Attending: Hematology and Oncology | Admitting: Hematology and Oncology

## 2017-05-29 DIAGNOSIS — C50412 Malignant neoplasm of upper-outer quadrant of left female breast: Secondary | ICD-10-CM

## 2017-05-29 DIAGNOSIS — Z170 Estrogen receptor positive status [ER+]: Principal | ICD-10-CM

## 2017-05-29 MED ORDER — GADOBENATE DIMEGLUMINE 529 MG/ML IV SOLN
13.0000 mL | Freq: Once | INTRAVENOUS | Status: AC | PRN
  Administered 2017-05-29: 13 mL via INTRAVENOUS

## 2017-05-29 NOTE — Telephone Encounter (Signed)
Received call from patient stating she has some concerns about the right breast and is wanting to have an MRI.  Dr. Lindi Adie ok to order. I notified Dr. Ethlyn Gallery office as well.

## 2017-05-30 ENCOUNTER — Other Ambulatory Visit: Payer: Self-pay | Admitting: Hematology and Oncology

## 2017-05-30 DIAGNOSIS — N63 Unspecified lump in unspecified breast: Secondary | ICD-10-CM

## 2017-06-01 ENCOUNTER — Telehealth: Payer: Self-pay

## 2017-06-01 ENCOUNTER — Other Ambulatory Visit: Payer: Self-pay

## 2017-06-01 MED ORDER — TAMOXIFEN CITRATE 20 MG PO TABS: 20.0000 mg | ORAL_TABLET | Freq: Every day | ORAL | 0 refills | Status: DC

## 2017-06-01 NOTE — Telephone Encounter (Signed)
Called pt to inform her of her tamoxifen refill but advised her to stop it on November 28th two weeks prior to sx of December 12th and to restart two weeks after sx.  Cyndia Bent RN

## 2017-06-11 ENCOUNTER — Ambulatory Visit
Admission: RE | Admit: 2017-06-11 | Discharge: 2017-06-11 | Disposition: A | Discharge status: Home / Self Care | Payer: BLUE CROSS/BLUE SHIELD | Source: Ambulatory Visit | Attending: Hematology and Oncology | Admitting: Hematology and Oncology

## 2017-06-11 DIAGNOSIS — N63 Unspecified lump in unspecified breast: Secondary | ICD-10-CM

## 2017-06-11 HISTORY — PX: BREAST BIOPSY: SHX20

## 2017-06-11 MED ORDER — GADOBENATE DIMEGLUMINE 529 MG/ML IV SOLN
13.0000 mL | Freq: Once | INTRAVENOUS | Status: AC | PRN
  Administered 2017-06-11: 13 mL via INTRAVENOUS

## 2017-06-12 ENCOUNTER — Other Ambulatory Visit: Payer: BLUE CROSS/BLUE SHIELD

## 2017-06-12 NOTE — Pre-Procedure Instructions (Addendum)
Alexis Underwood  06/12/2017      [facility] - [place], [place] [address] Phone: [phone] Fax: [phone]    Your procedure is scheduled on Wednesday 06/20/2017.  Report to [facility] Admitting at [place].M.  Call this number if you have problems the morning of surgery:  [phone]   Remember:  Do not eat food or drink liquids after midnight the night before your surgery except drink all of the Ensure Pre-Surgery Drink before leaving home the morning of surgery.   Continue all medications as directed by your physician except follow these medication instructions before surgery below   Take these medicines the morning of surgery with A SIP OF WATER: Acetaminophen (Tylenol) - if needed  7 days prior to surgery STOP taking any Aspirin(unless otherwise instructed by your surgeon), Aleve, Naproxen, Ibuprofen, Motrin, Advil, Goody's, BC's, all herbal medications, fish oil, and all vitamins   Do not wear jewelry, make-up or nail polish.  Do not wear lotions, powders, or perfumes, or deodorant.  Do not shave 48 hours prior to surgery.    Do not bring valuables to the hospital.  [facility] is not responsible for any belongings or valuables.  Contacts, eyeglasses, dentures or bridgework may not be worn into surgery.  Leave your suitcase in the car.  After surgery it may be brought to your room.  For patients admitted to the hospital, discharge time will be determined by your treatment team.  Patients discharged the day of surgery will not be allowed to drive home.   Name and phone number of your driver:    Special instructions:   [facility]- Preparing For Surgery  Before surgery, you can play an important role. Because skin is not sterile, your skin needs to be as free of germs as possible. You can reduce the number of germs on your skin by washing with CHG (chlorahexidine gluconate) Soap  before surgery.  CHG is an antiseptic cleaner which kills germs and bonds with the skin to continue killing germs even after washing.  Please do not use if you have an allergy to CHG or antibacterial soaps. If your skin becomes reddened/irritated stop using the CHG.  Do not shave (including legs and underarms) for at least 48 hours prior to first CHG shower. It is OK to shave your face.  Please follow these instructions carefully.   1. Shower the NIGHT BEFORE SURGERY and the MORNING OF SURGERY with CHG.   2. If you chose to wash your hair, wash your hair first as usual with your normal shampoo.  3. After you shampoo, rinse your hair and body thoroughly to remove the shampoo.  4. Use CHG as you would any other liquid soap. You can apply CHG directly to the skin and wash gently with a scrungie or a clean washcloth.   5. Apply the CHG Soap to your body ONLY FROM THE NECK DOWN.  Do not use on open wounds or open sores. Avoid contact with your eyes, ears, mouth and genitals (private parts). Wash Face and genitals (private parts)  with your normal soap.  6. Wash thoroughly, paying special attention to the area where your surgery will be performed.  7. Thoroughly rinse your body with warm water from the neck down.  8. DO NOT shower/wash with your normal soap after using and rinsing off the CHG Soap.  9. Pat yourself dry  with a CLEAN TOWEL.  10. Wear CLEAN PAJAMAS to bed the night before surgery, wear comfortable clothes the morning of surgery  11. Place CLEAN SHEETS on your bed the night of your first shower and DO NOT SLEEP WITH PETS.    Day of Surgery: Shower as stated above. Do not apply any deodorants/lotions.  Please wear clean clothes to the hospital/surgery center.      Please read over the following fact sheets that you were given.

## 2017-06-13 ENCOUNTER — Encounter (HOSPITAL_COMMUNITY)
Admission: RE | Admit: 2017-06-13 | Discharge: 2017-06-13 | Disposition: A | Discharge status: Home / Self Care | Payer: BLUE CROSS/BLUE SHIELD | Source: Ambulatory Visit | Attending: General Surgery | Admitting: General Surgery

## 2017-06-13 ENCOUNTER — Encounter (HOSPITAL_COMMUNITY): Payer: Self-pay

## 2017-06-13 ENCOUNTER — Ambulatory Visit: Payer: Self-pay | Admitting: Plastic Surgery

## 2017-06-13 ENCOUNTER — Other Ambulatory Visit: Payer: Self-pay

## 2017-06-13 DIAGNOSIS — Z800 Family history of malignant neoplasm of digestive organs: Secondary | ICD-10-CM | POA: Diagnosis not present

## 2017-06-13 DIAGNOSIS — Z170 Estrogen receptor positive status [ER+]: Secondary | ICD-10-CM | POA: Insufficient documentation

## 2017-06-13 DIAGNOSIS — Z79899 Other long term (current) drug therapy: Secondary | ICD-10-CM | POA: Diagnosis not present

## 2017-06-13 DIAGNOSIS — Z01818 Encounter for other preprocedural examination: Secondary | ICD-10-CM | POA: Diagnosis present

## 2017-06-13 DIAGNOSIS — Z9889 Other specified postprocedural states: Secondary | ICD-10-CM | POA: Insufficient documentation

## 2017-06-13 DIAGNOSIS — E785 Hyperlipidemia, unspecified: Secondary | ICD-10-CM | POA: Insufficient documentation

## 2017-06-13 DIAGNOSIS — Z803 Family history of malignant neoplasm of breast: Secondary | ICD-10-CM | POA: Diagnosis not present

## 2017-06-13 DIAGNOSIS — C50412 Malignant neoplasm of upper-outer quadrant of left female breast: Secondary | ICD-10-CM | POA: Diagnosis present

## 2017-06-13 DIAGNOSIS — Z8249 Family history of ischemic heart disease and other diseases of the circulatory system: Secondary | ICD-10-CM | POA: Diagnosis not present

## 2017-06-13 DIAGNOSIS — C50912 Malignant neoplasm of unspecified site of left female breast: Secondary | ICD-10-CM

## 2017-06-13 DIAGNOSIS — R9431 Abnormal electrocardiogram [ECG] [EKG]: Secondary | ICD-10-CM | POA: Diagnosis not present

## 2017-06-13 DIAGNOSIS — Z1501 Genetic susceptibility to malignant neoplasm of breast: Secondary | ICD-10-CM | POA: Diagnosis not present

## 2017-06-13 LAB — CBC
HCT: 39.3 % (ref 36.0–46.0)
Hemoglobin: 13.3 g/dL (ref 12.0–15.0)
MCH: 32.5 pg (ref 26.0–34.0)
MCHC: 33.8 g/dL (ref 30.0–36.0)
MCV: 96.1 fL (ref 78.0–100.0)
Platelets: 293 10*3/uL (ref 150–400)
RBC: 4.09 MIL/uL (ref 3.87–5.11)
RDW: 11.8 % (ref 11.5–15.5)
WBC: 5.3 10*3/uL (ref 4.0–10.5)

## 2017-06-13 LAB — BASIC METABOLIC PANEL
Anion gap: 10 (ref 5–15)
BUN: 12 mg/dL (ref 6–20)
CHLORIDE: 102 mmol/L (ref 101–111)
CO2: 27 mmol/L (ref 22–32)
Calcium: 8.6 mg/dL — ABNORMAL LOW (ref 8.9–10.3)
Creatinine, Ser: 0.72 mg/dL (ref 0.44–1.00)
GFR calc non Af Amer: >60 mL/min (ref >60)
Glucose, Bld: 96 mg/dL (ref 65–99)
Potassium: 3.2 mmol/L — ABNORMAL LOW (ref 3.5–5.1)
SODIUM: 139 mmol/L (ref 135–145)

## 2017-06-13 LAB — HCG, SERUM, QUALITATIVE: Preg, Serum: NEGATIVE

## 2017-06-14 ENCOUNTER — Ambulatory Visit (HOSPITAL_BASED_OUTPATIENT_CLINIC_OR_DEPARTMENT_OTHER)
Admission: RE | Admit: 2017-06-14 | Discharge: 2017-06-14 | Disposition: A | Discharge status: Home / Self Care | Payer: BLUE CROSS/BLUE SHIELD | Source: Ambulatory Visit | Attending: Internal Medicine | Admitting: Internal Medicine

## 2017-06-14 ENCOUNTER — Ambulatory Visit (HOSPITAL_COMMUNITY)
Admission: RE | Admit: 2017-06-14 | Discharge: 2017-06-14 | Disposition: A | Discharge status: Home / Self Care | Payer: BLUE CROSS/BLUE SHIELD | Source: Ambulatory Visit | Attending: Family Medicine | Admitting: Family Medicine

## 2017-06-14 VITALS — BP 144/88 | HR 95 | Wt 137.0 lb

## 2017-06-14 DIAGNOSIS — C50412 Malignant neoplasm of upper-outer quadrant of left female breast: Secondary | ICD-10-CM

## 2017-06-14 DIAGNOSIS — Z170 Estrogen receptor positive status [ER+]: Secondary | ICD-10-CM | POA: Diagnosis not present

## 2017-06-14 NOTE — Progress Notes (Signed)
CARDIO-ONCOLOGY CLINIC CONSULT NOTE  Referring Physician: Lindi Adie   HPI: Alexis Underwood is 41 y.o. 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  Denies any significant PMHx other than her breast CA.   Very active. No heart problems. No SOB, CP or edema. No palpitations.   Echo (06/14/17); EF 55-60% normal RV. Normal diastolic function. GLS -23.3. LS not adequate   SUMMARY OF ONCOLOGIC HISTORY:       Malignant neoplasm of upper-outer quadrant of left breast in female, estrogen receptor positive (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%, PR 90%,Ki-67 45%, HER-2 positive ratio 5.18; iopsy of the 2:00 mass and calcifications were DCIS with suspicion for microinvasion, T1 cN0 stage IA clinical stage AJCC 8       Review of Systems: [y] = yes, _0  = no   General: Weight gain _1 ; Weight loss _2 ; Anorexia _3 ; Fatigue _4 ; Fever _5 ; Chills _6 ; Weakness _7   Cardiac: Chest pain/pressure _8 ; Resting SOB _9 ; Exertional SOB _10 ; Orthopnea _11 ; Pedal Edema _12 ; Palpitations _13 ; Syncope _14 ; Presyncope _15 ; Paroxysmal nocturnal dyspnea_16   Pulmonary: Cough _17 ; Wheezing_18 ; Hemoptysis_19 ; Sputum _20 ; Snoring _21   GI: Vomiting_22 ; Dysphagia_23 ; Melena_24 ; Hematochezia _25 ; Heartburn_26 ; Abdominal pain _27 ; Constipation _28 ; Diarrhea _29 ; BRBPR _30   GU: Hematuria_31 ; Dysuria _32 ; Nocturia_33   Vascular: Pain in legs with walking _34 ; Pain in feet with lying flat _35 ; Non-healing sores _36 ; Stroke _37 ; TIA _38 ; Slurred speech _39 ;  Neuro: Headaches_40 ; Vertigo_41 ; Seizures_42 ; Paresthesias_43 ;Blurred vision _44 ; Diplopia _45 ; Vision changes _46   Ortho/Skin: Arthritis _47 ; Joint pain _48 ; Muscle pain _49 ; Joint swelling _50 ;  Back Pain _51 ; Rash _52   Psych: Depression_53 ; Anxiety_54   Heme: Bleeding problems _55 ; Clotting disorders _56 ; Anemia _57   Endocrine: Diabetes _58 ; Thyroid dysfunction_59    Past Medical History:  Diagnosis Date  . Breast cancer ([place])   . Family history of breast cancer   . Family history of throat cancer   . Hyperlipidemia   . SVD (spontaneous vaginal delivery)     Current Outpatient Medications  Medication Sig Dispense Refill  . acetaminophen (TYLENOL) 325 MG tablet Take 650 mg by mouth daily as needed for headache.    . dextromethorphan-guaiFENesin (MUCINEX DM) 30-600 MG 12hr tablet Take 1 tablet by mouth 2 (two) times daily as needed for cough.    . lovastatin (MEVACOR) 40 MG tablet Take 40 mg by mouth every evening.    . lidocaine-prilocaine (EMLA) cream Apply 1 application topically daily as needed (port access).     . tamoxifen (NOLVADEX) 20 MG tablet Take 1 tablet (20 mg total) by mouth daily. (Patient not taking: Reported on 06/14/2017) 30 tablet 0   No current facility-administered medications for this encounter.     No Known Allergies    Social History   Socioeconomic History  . Marital status: Married    Spouse name: Not on file  . Number of children: Not on file  .  Years of education: Not on file  . Highest education level: Not on file  Social Needs  . Financial resource strain: Not on file  . Food insecurity - worry: Not on file  . Food insecurity - inability: Not on file  . Transportation needs - medical: Not on file  . Transportation needs - non-medical: Not on file  Occupational History  . Not on file  Tobacco Use  . Smoking status: Never Smoker  . Smokeless tobacco: Never Used  Substance and Sexual Activity  . Alcohol use: No  . Drug use: No  . Sexual activity: Not on file  Other Topics Concern  . Not on file  Social History Narrative  . Not on file      Family History  Problem Relation Age of Onset  . Heart attack Mother   .  Hyperlipidemia Mother   . Hypertension Mother   . Heart failure Mother   . Breast cancer Paternal Grandmother 90  . Heart disease Maternal Grandfather        died in 52's  . Throat cancer Paternal Grandfather 15    Vitals:   06/14/17 0857  BP: (!) 144/88  Pulse: 95  SpO2: 98%  Weight: 137 lb (62.1 kg)    PHYSICAL EXAM: General:  Well appearing. No respiratory difficulty HEENT: normal Neck: supple. no JVD. Carotids 2+ bilat; no bruits. No lymphadenopathy or thryomegaly appreciated. Cor: PMI nondisplaced. Regular rate & rhythm. No rubs, gallops or murmurs. Lungs: clear Abdomen: soft, nontender, nondistended. No hepatosplenomegaly. No bruits or masses. Good bowel sounds. Extremities: no cyanosis, clubbing, rash, edema Neuro: alert & oriented x 3, cranial nerves grossly intact. moves all 4 extremities w/o difficulty. Affect pleasant.   ASSESSMENT & PLAN: 1. Left breast Cancer, triple positive - ER 95%, PR 90%,Ki-67 45%, HER-2 positive ratio 5.18; - T1 cN0 stage IA clinical stage AJCC 8 - Explained incidence of Herceptin cardiotoxicity and role of Cardio-oncology clinic at length. Echo images reviewed personally. All parameters stable. Reviewed signs and symptoms of HF to look for. Ok to start Herceptin. Follow-up with echo in 3 months.   Glori Bickers, MD  9:28 AM

## 2017-06-14 NOTE — Progress Notes (Signed)
  Echocardiogram 2D Echocardiogram has been performed.  Alexis Underwood M 06/14/2017, 8:30 AM

## 2017-06-14 NOTE — Patient Instructions (Signed)
Your physician recommends that you schedule a follow-up appointment in: 3 months with echocardiogram  

## 2017-06-20 ENCOUNTER — Observation Stay (HOSPITAL_COMMUNITY): Payer: BLUE CROSS/BLUE SHIELD

## 2017-06-20 ENCOUNTER — Ambulatory Visit (HOSPITAL_COMMUNITY): Payer: BLUE CROSS/BLUE SHIELD | Admitting: Vascular Surgery

## 2017-06-20 ENCOUNTER — Ambulatory Visit (HOSPITAL_COMMUNITY): Payer: BLUE CROSS/BLUE SHIELD

## 2017-06-20 ENCOUNTER — Ambulatory Visit (HOSPITAL_COMMUNITY)
Admission: RE | Admit: 2017-06-20 | Discharge: 2017-06-20 | Disposition: A | Discharge status: Home / Self Care | Payer: BLUE CROSS/BLUE SHIELD | Source: Ambulatory Visit | Attending: General Surgery | Admitting: General Surgery

## 2017-06-20 ENCOUNTER — Encounter (HOSPITAL_COMMUNITY)
Admission: RE | Disposition: A | Discharge status: Home / Self Care | Payer: Self-pay | Source: Ambulatory Visit | Attending: Plastic Surgery

## 2017-06-20 ENCOUNTER — Encounter (HOSPITAL_COMMUNITY): Payer: Self-pay

## 2017-06-20 ENCOUNTER — Observation Stay (HOSPITAL_COMMUNITY)
Admission: RE | Admit: 2017-06-20 | Discharge: 2017-06-21 | Disposition: A | Discharge status: Home / Self Care | Payer: BLUE CROSS/BLUE SHIELD | Source: Ambulatory Visit | Attending: Plastic Surgery | Admitting: Plastic Surgery

## 2017-06-20 ENCOUNTER — Other Ambulatory Visit: Payer: Self-pay

## 2017-06-20 DIAGNOSIS — C50412 Malignant neoplasm of upper-outer quadrant of left female breast: Secondary | ICD-10-CM | POA: Diagnosis present

## 2017-06-20 DIAGNOSIS — Z79899 Other long term (current) drug therapy: Secondary | ICD-10-CM | POA: Diagnosis not present

## 2017-06-20 DIAGNOSIS — Z95828 Presence of other vascular implants and grafts: Secondary | ICD-10-CM

## 2017-06-20 DIAGNOSIS — Z803 Family history of malignant neoplasm of breast: Secondary | ICD-10-CM | POA: Diagnosis not present

## 2017-06-20 DIAGNOSIS — E7800 Pure hypercholesterolemia, unspecified: Secondary | ICD-10-CM | POA: Diagnosis not present

## 2017-06-20 DIAGNOSIS — C50912 Malignant neoplasm of unspecified site of left female breast: Secondary | ICD-10-CM

## 2017-06-20 DIAGNOSIS — Z170 Estrogen receptor positive status [ER+]: Secondary | ICD-10-CM | POA: Diagnosis not present

## 2017-06-20 DIAGNOSIS — Z419 Encounter for procedure for purposes other than remedying health state, unspecified: Secondary | ICD-10-CM

## 2017-06-20 DIAGNOSIS — C50919 Malignant neoplasm of unspecified site of unspecified female breast: Secondary | ICD-10-CM | POA: Diagnosis present

## 2017-06-20 HISTORY — PX: BREAST RECONSTRUCTION WITH PLACEMENT OF TISSUE EXPANDER AND FLEX HD (ACELLULAR HYDRATED DERMIS): SHX6295

## 2017-06-20 HISTORY — DX: Personal history of urinary calculi: Z87.442

## 2017-06-20 HISTORY — PX: MASTECTOMY W/ SENTINEL NODE BIOPSY: SHX2001

## 2017-06-20 HISTORY — PX: PORTACATH PLACEMENT: SHX2246

## 2017-06-20 SURGERY — MASTECTOMY WITH SENTINEL LYMPH NODE BIOPSY
Anesthesia: Regional | Site: Chest | Laterality: Right

## 2017-06-20 MED ORDER — BISACODYL 5 MG PO TBEC: 5.0000 mg | DELAYED_RELEASE_TABLET | Freq: Every day | ORAL | Status: DC | PRN

## 2017-06-20 MED ORDER — CHLORHEXIDINE GLUCONATE CLOTH 2 % EX PADS: 6.0000 | MEDICATED_PAD | Freq: Once | CUTANEOUS | Status: DC

## 2017-06-20 MED ORDER — KCL IN DEXTROSE-NACL 20-5-0.45 MEQ/L-%-% IV SOLN
INTRAVENOUS | Status: DC
  Administered 2017-06-20 – 2017-06-21 (×2): via INTRAVENOUS
  Filled 2017-06-20 (×2): qty 1000

## 2017-06-20 MED ORDER — EPHEDRINE 5 MG/ML INJ
INTRAVENOUS | Status: AC
  Filled 2017-06-20: qty 10

## 2017-06-20 MED ORDER — EPHEDRINE SULFATE 50 MG/ML IJ SOLN
INTRAMUSCULAR | Status: DC | PRN
  Administered 2017-06-20 (×5): 5 mg via INTRAVENOUS

## 2017-06-20 MED ORDER — HEPARIN SOD (PORK) LOCK FLUSH 100 UNIT/ML IV SOLN
INTRAVENOUS | Status: DC | PRN
  Administered 2017-06-20: 300 [IU]

## 2017-06-20 MED ORDER — DIPHENHYDRAMINE HCL 12.5 MG/5ML PO ELIX: 12.5000 mg | ORAL_SOLUTION | Freq: Four times a day (QID) | ORAL | Status: DC | PRN

## 2017-06-20 MED ORDER — GABAPENTIN 300 MG PO CAPS
300.0000 mg | ORAL_CAPSULE | ORAL | Status: AC
  Administered 2017-06-20: 300 mg via ORAL
  Filled 2017-06-20: qty 1

## 2017-06-20 MED ORDER — FENTANYL CITRATE (PF) 100 MCG/2ML IJ SOLN
INTRAMUSCULAR | Status: DC | PRN
  Administered 2017-06-20: 50 ug via INTRAVENOUS
  Administered 2017-06-20 (×3): 25 ug via INTRAVENOUS
  Administered 2017-06-20: 50 ug via INTRAVENOUS

## 2017-06-20 MED ORDER — NAPROXEN 250 MG PO TABS
500.0000 mg | ORAL_TABLET | Freq: Two times a day (BID) | ORAL | Status: DC | PRN
  Administered 2017-06-21: 500 mg via ORAL
  Filled 2017-06-20: qty 2

## 2017-06-20 MED ORDER — PHENYLEPHRINE 40 MCG/ML (10ML) SYRINGE FOR IV PUSH (FOR BLOOD PRESSURE SUPPORT)
PREFILLED_SYRINGE | INTRAVENOUS | Status: AC
  Filled 2017-06-20: qty 10

## 2017-06-20 MED ORDER — BUPIVACAINE HCL (PF) 0.25 % IJ SOLN
INTRAMUSCULAR | Status: DC | PRN
  Administered 2017-06-20: 5 mL

## 2017-06-20 MED ORDER — POLYMYXIN B SULFATE 500000 UNITS IJ SOLR
INTRAMUSCULAR | Status: DC | PRN
  Administered 2017-06-20: 500 mL

## 2017-06-20 MED ORDER — ONDANSETRON HCL 4 MG/2ML IJ SOLN: 4.0000 mg | Freq: Four times a day (QID) | INTRAMUSCULAR | Status: DC | PRN

## 2017-06-20 MED ORDER — HEPARIN SOD (PORK) LOCK FLUSH 100 UNIT/ML IV SOLN
INTRAVENOUS | Status: AC
  Filled 2017-06-20: qty 5

## 2017-06-20 MED ORDER — ZOLPIDEM TARTRATE 5 MG PO TABS: 5.0000 mg | ORAL_TABLET | Freq: Every evening | ORAL | Status: DC | PRN

## 2017-06-20 MED ORDER — DIAZEPAM 2 MG PO TABS: 2.0000 mg | ORAL_TABLET | Freq: Two times a day (BID) | ORAL | Status: DC | PRN

## 2017-06-20 MED ORDER — DIPHENHYDRAMINE HCL 50 MG/ML IJ SOLN: 12.5000 mg | Freq: Four times a day (QID) | INTRAMUSCULAR | Status: DC | PRN

## 2017-06-20 MED ORDER — DEXAMETHASONE SODIUM PHOSPHATE 10 MG/ML IJ SOLN
INTRAMUSCULAR | Status: AC
  Filled 2017-06-20: qty 1

## 2017-06-20 MED ORDER — HYDROCODONE-ACETAMINOPHEN 5-325 MG PO TABS
1.0000 | ORAL_TABLET | ORAL | Status: DC | PRN
  Administered 2017-06-20 – 2017-06-21 (×2): 1 via ORAL
  Filled 2017-06-20: qty 1
  Filled 2017-06-20: qty 2
  Filled 2017-06-20: qty 1

## 2017-06-20 MED ORDER — LIDOCAINE HCL (CARDIAC) 20 MG/ML IV SOLN
INTRAVENOUS | Status: DC | PRN
  Administered 2017-06-20: 60 mg via INTRAVENOUS

## 2017-06-20 MED ORDER — ACETAMINOPHEN 500 MG PO TABS
500.0000 mg | ORAL_TABLET | Freq: Four times a day (QID) | ORAL | Status: DC
  Administered 2017-06-20 – 2017-06-21 (×4): 500 mg via ORAL
  Filled 2017-06-20 (×3): qty 1

## 2017-06-20 MED ORDER — POLYETHYLENE GLYCOL 3350 17 G PO PACK: 17.0000 g | PACK | Freq: Every day | ORAL | Status: DC | PRN

## 2017-06-20 MED ORDER — ACETAMINOPHEN 500 MG PO TABS
1000.0000 mg | ORAL_TABLET | ORAL | Status: AC
  Administered 2017-06-20: 1000 mg via ORAL
  Filled 2017-06-20: qty 2

## 2017-06-20 MED ORDER — DM-GUAIFENESIN ER 30-600 MG PO TB12: 1.0000 | ORAL_TABLET | Freq: Two times a day (BID) | ORAL | Status: DC | PRN

## 2017-06-20 MED ORDER — PROPOFOL 10 MG/ML IV BOLUS
INTRAVENOUS | Status: DC | PRN
  Administered 2017-06-20: 150 mg via INTRAVENOUS

## 2017-06-20 MED ORDER — DEXAMETHASONE SODIUM PHOSPHATE 10 MG/ML IJ SOLN
INTRAMUSCULAR | Status: DC | PRN
  Administered 2017-06-20: 10 mg via INTRAVENOUS

## 2017-06-20 MED ORDER — HYDROMORPHONE HCL 1 MG/ML IJ SOLN: 1.0000 mg | INTRAMUSCULAR | Status: DC | PRN

## 2017-06-20 MED ORDER — SUCCINYLCHOLINE CHLORIDE 200 MG/10ML IV SOSY
PREFILLED_SYRINGE | INTRAVENOUS | Status: AC
  Filled 2017-06-20: qty 10

## 2017-06-20 MED ORDER — ACETAMINOPHEN 500 MG PO TABS
ORAL_TABLET | ORAL | Status: AC
  Filled 2017-06-20: qty 1

## 2017-06-20 MED ORDER — ONDANSETRON HCL 4 MG/2ML IJ SOLN
INTRAMUSCULAR | Status: AC
  Filled 2017-06-20: qty 2

## 2017-06-20 MED ORDER — CEFAZOLIN SODIUM-DEXTROSE 2-4 GM/100ML-% IV SOLN: 2.0000 g | INTRAVENOUS | Status: DC

## 2017-06-20 MED ORDER — HYDROMORPHONE HCL 1 MG/ML IJ SOLN: 0.2500 mg | INTRAMUSCULAR | Status: DC | PRN

## 2017-06-20 MED ORDER — METHYLENE BLUE 0.5 % INJ SOLN
INTRAVENOUS | Status: AC
  Filled 2017-06-20: qty 10

## 2017-06-20 MED ORDER — LACTATED RINGERS IV SOLN
INTRAVENOUS | Status: DC | PRN
  Administered 2017-06-20 (×3): via INTRAVENOUS

## 2017-06-20 MED ORDER — CELECOXIB 200 MG PO CAPS
200.0000 mg | ORAL_CAPSULE | ORAL | Status: AC
  Administered 2017-06-20: 200 mg via ORAL
  Filled 2017-06-20: qty 1

## 2017-06-20 MED ORDER — ONDANSETRON HCL 4 MG/2ML IJ SOLN
INTRAMUSCULAR | Status: DC | PRN
  Administered 2017-06-20: 4 mg via INTRAVENOUS

## 2017-06-20 MED ORDER — OXYCODONE HCL 5 MG PO TABS: 5.0000 mg | ORAL_TABLET | Freq: Once | ORAL | Status: DC | PRN

## 2017-06-20 MED ORDER — 0.9 % SODIUM CHLORIDE (POUR BTL) OPTIME
TOPICAL | Status: DC | PRN
  Administered 2017-06-20: 1000 mL

## 2017-06-20 MED ORDER — SODIUM CHLORIDE 0.9 % IV SOLN
INTRAVENOUS | Status: DC | PRN
  Administered 2017-06-20: 12:00:00 100 mL

## 2017-06-20 MED ORDER — CEFAZOLIN SODIUM-DEXTROSE 2-4 GM/100ML-% IV SOLN
2.0000 g | Freq: Three times a day (TID) | INTRAVENOUS | Status: DC
  Administered 2017-06-20 – 2017-06-21 (×3): 2 g via INTRAVENOUS
  Filled 2017-06-20 (×5): qty 100

## 2017-06-20 MED ORDER — ROCURONIUM BROMIDE 10 MG/ML (PF) SYRINGE
PREFILLED_SYRINGE | INTRAVENOUS | Status: AC
  Filled 2017-06-20: qty 5

## 2017-06-20 MED ORDER — BUPIVACAINE HCL (PF) 0.25 % IJ SOLN
INTRAMUSCULAR | Status: AC
  Filled 2017-06-20: qty 30

## 2017-06-20 MED ORDER — FENTANYL CITRATE (PF) 250 MCG/5ML IJ SOLN
INTRAMUSCULAR | Status: AC
  Filled 2017-06-20: qty 5

## 2017-06-20 MED ORDER — MIDAZOLAM HCL 5 MG/5ML IJ SOLN
INTRAMUSCULAR | Status: DC | PRN
  Administered 2017-06-20 (×2): 1 mg via INTRAVENOUS

## 2017-06-20 MED ORDER — SUGAMMADEX SODIUM 200 MG/2ML IV SOLN
INTRAVENOUS | Status: DC | PRN
  Administered 2017-06-20: 125 mg via INTRAVENOUS

## 2017-06-20 MED ORDER — ROCURONIUM BROMIDE 100 MG/10ML IV SOLN
INTRAVENOUS | Status: DC | PRN
  Administered 2017-06-20: 50 mg via INTRAVENOUS
  Administered 2017-06-20 (×3): 20 mg via INTRAVENOUS

## 2017-06-20 MED ORDER — CEFAZOLIN SODIUM-DEXTROSE 2-4 GM/100ML-% IV SOLN
2.0000 g | INTRAVENOUS | Status: AC
  Administered 2017-06-20: 2 g via INTRAVENOUS
  Filled 2017-06-20: qty 100

## 2017-06-20 MED ORDER — PROPOFOL 10 MG/ML IV BOLUS
INTRAVENOUS | Status: AC
  Filled 2017-06-20: qty 20

## 2017-06-20 MED ORDER — MIDAZOLAM HCL 2 MG/2ML IJ SOLN
INTRAMUSCULAR | Status: AC
  Filled 2017-06-20: qty 2

## 2017-06-20 MED ORDER — ONDANSETRON 4 MG PO TBDP: 4.0000 mg | ORAL_TABLET | Freq: Four times a day (QID) | ORAL | Status: DC | PRN

## 2017-06-20 MED ORDER — SUGAMMADEX SODIUM 200 MG/2ML IV SOLN
INTRAVENOUS | Status: AC
  Filled 2017-06-20: qty 2

## 2017-06-20 MED ORDER — OXYCODONE HCL 5 MG/5ML PO SOLN: 5.0000 mg | Freq: Once | ORAL | Status: DC | PRN

## 2017-06-20 MED ORDER — LIDOCAINE 2% (20 MG/ML) 5 ML SYRINGE
INTRAMUSCULAR | Status: AC
  Filled 2017-06-20: qty 5

## 2017-06-20 MED ORDER — SENNA 8.6 MG PO TABS
1.0000 | ORAL_TABLET | Freq: Two times a day (BID) | ORAL | Status: DC
  Administered 2017-06-20 – 2017-06-21 (×3): 8.6 mg via ORAL
  Filled 2017-06-20 (×3): qty 1

## 2017-06-20 MED ORDER — PHENYLEPHRINE HCL 10 MG/ML IJ SOLN
INTRAMUSCULAR | Status: DC | PRN
  Administered 2017-06-20 (×5): 80 ug via INTRAVENOUS

## 2017-06-20 MED ORDER — TECHNETIUM TC 99M SULFUR COLLOID FILTERED
1.0000 | Freq: Once | INTRAVENOUS | Status: AC | PRN
  Administered 2017-06-20: 1 via INTRADERMAL

## 2017-06-20 MED ORDER — PRAVASTATIN SODIUM 10 MG PO TABS
20.0000 mg | ORAL_TABLET | Freq: Every day | ORAL | Status: DC
  Administered 2017-06-20: 20 mg via ORAL
  Filled 2017-06-20: qty 2

## 2017-06-20 SURGICAL SUPPLY — 108 items
ADH SKN CLS APL DERMABOND .7 (GAUZE/BANDAGES/DRESSINGS) ×12
APPLIER CLIP 9.375 MED OPEN (MISCELLANEOUS) ×5
APR CLP MED 9.3 20 MLT OPN (MISCELLANEOUS) ×3
BAG DECANTER FOR FLEXI CONT (MISCELLANEOUS) ×8 IMPLANT
BINDER BREAST LRG (GAUZE/BANDAGES/DRESSINGS) ×2 IMPLANT
BINDER BREAST XLRG (GAUZE/BANDAGES/DRESSINGS) IMPLANT
BIOPATCH RED 1 DISK 7.0 (GAUZE/BANDAGES/DRESSINGS) ×10 IMPLANT
BIOPATCH RED 1IN DISK 7.0MM (GAUZE/BANDAGES/DRESSINGS) ×1
BLADE SURG 15 STRL LF DISP TIS (BLADE) ×3 IMPLANT
BLADE SURG 15 STRL SS (BLADE) ×5
CANISTER SUCT 3000ML PPV (MISCELLANEOUS) ×10 IMPLANT
CHLORAPREP W/TINT 10.5 ML (MISCELLANEOUS) ×7 IMPLANT
CHLORAPREP W/TINT 26ML (MISCELLANEOUS) ×8 IMPLANT
CLIP APPLIE 9.375 MED OPEN (MISCELLANEOUS) ×3 IMPLANT
CONT SPEC 4OZ CLIKSEAL STRL BL (MISCELLANEOUS) ×5 IMPLANT
COVER PROBE W GEL 5X96 (DRAPES) ×5 IMPLANT
COVER SURGICAL LIGHT HANDLE (MISCELLANEOUS) ×14 IMPLANT
COVER TRANSDUCER ULTRASND GEL (DRAPE) ×2 IMPLANT
CRADLE DONUT ADULT HEAD (MISCELLANEOUS) ×3 IMPLANT
DERMABOND ADVANCED (GAUZE/BANDAGES/DRESSINGS) ×8
DERMABOND ADVANCED .7 DNX12 (GAUZE/BANDAGES/DRESSINGS) ×6 IMPLANT
DEVICE DISSECT PLASMABLAD 3.0S (MISCELLANEOUS) IMPLANT
DRAIN CHANNEL 19F RND (DRAIN) ×8 IMPLANT
DRAPE C-ARM 42X72 X-RAY (DRAPES) ×5 IMPLANT
DRAPE CHEST BREAST 15X10 FENES (DRAPES) ×5 IMPLANT
DRAPE HALF SHEET 40X57 (DRAPES) ×7 IMPLANT
DRAPE ORTHO SPLIT 77X108 STRL (DRAPES) ×10
DRAPE SURG 17X23 STRL (DRAPES) ×20 IMPLANT
DRAPE SURG ORHT 6 SPLT 77X108 (DRAPES) ×6 IMPLANT
DRAPE UTILITY XL STRL (DRAPES) ×8 IMPLANT
DRAPE WARM FLUID 44X44 (DRAPE) ×5 IMPLANT
DRSG PAD ABDOMINAL 8X10 ST (GAUZE/BANDAGES/DRESSINGS) ×5 IMPLANT
DRSG TEGADERM 4X4.75 (GAUZE/BANDAGES/DRESSINGS) ×5 IMPLANT
ELECT BLADE 4.0 EZ CLEAN MEGAD (MISCELLANEOUS) ×10
ELECT CAUTERY BLADE 6.4 (BLADE) ×7 IMPLANT
ELECT REM PT RETURN 9FT ADLT (ELECTROSURGICAL) ×10
ELECTRODE BLDE 4.0 EZ CLN MEGD (MISCELLANEOUS) IMPLANT
ELECTRODE REM PT RTRN 9FT ADLT (ELECTROSURGICAL) ×6 IMPLANT
EVACUATOR SILICONE 100CC (DRAIN) ×8 IMPLANT
GAUZE SPONGE 4X4 12PLY STRL (GAUZE/BANDAGES/DRESSINGS) ×5 IMPLANT
GAUZE SPONGE 4X4 12PLY STRL LF (GAUZE/BANDAGES/DRESSINGS) ×2 IMPLANT
GAUZE SPONGE 4X4 16PLY XRAY LF (GAUZE/BANDAGES/DRESSINGS) ×5 IMPLANT
GEL ULTRASOUND 20GR AQUASONIC (MISCELLANEOUS) ×2 IMPLANT
GLOVE BIO SURGEON STRL SZ 6.5 (GLOVE) ×8 IMPLANT
GLOVE BIO SURGEON STRL SZ7.5 (GLOVE) ×5 IMPLANT
GLOVE BIO SURGEONS STRL SZ 6.5 (GLOVE) ×2
GLOVE BIOGEL PI IND STRL 8 (GLOVE) IMPLANT
GLOVE BIOGEL PI INDICATOR 8 (GLOVE) ×2
GLOVE SURG SS PI 8.0 STRL IVOR (GLOVE) ×2 IMPLANT
GOWN STRL REUS W/ TWL LRG LVL3 (GOWN DISPOSABLE) ×12 IMPLANT
GOWN STRL REUS W/TWL LRG LVL3 (GOWN DISPOSABLE) ×30
GRAFT FLEX HD 4X16 THICK (Tissue Mesh) ×2 IMPLANT
IMPL EXPANDER BREAST 375CC (Breast) IMPLANT
IMPLANT BREAST 375CC (Breast) ×2 IMPLANT
IMPLANT EXPANDER BREAST 375CC (Breast) ×3 IMPLANT
INTRODUCER COOK 11FR (CATHETERS) IMPLANT
KIT BASIN OR (CUSTOM PROCEDURE TRAY) ×8 IMPLANT
KIT PORT POWER 8FR ISP CVUE (Miscellaneous) ×2 IMPLANT
KIT ROOM TURNOVER OR (KITS) ×8 IMPLANT
LIGHT WAVEGUIDE WIDE FLAT (MISCELLANEOUS) ×2 IMPLANT
NDL 18GX1X1/2 (RX/OR ONLY) (NEEDLE) IMPLANT
NDL 21 GA WING INFUSION (NEEDLE) IMPLANT
NDL FILTER BLUNT 18X1 1/2 (NEEDLE) IMPLANT
NDL HYPO 25GX1X1/2 BEV (NEEDLE) ×3 IMPLANT
NEEDLE 18GX1X1/2 (RX/OR ONLY) (NEEDLE) IMPLANT
NEEDLE 21 GA WING INFUSION (NEEDLE) ×5 IMPLANT
NEEDLE 22X1 1/2 (OR ONLY) (NEEDLE) IMPLANT
NEEDLE FILTER BLUNT 18X 1/2SAF (NEEDLE)
NEEDLE FILTER BLUNT 18X1 1/2 (NEEDLE) IMPLANT
NEEDLE HYPO 25GX1X1/2 BEV (NEEDLE) ×5 IMPLANT
NS IRRIG 1000ML POUR BTL (IV SOLUTION) ×15 IMPLANT
PACK GENERAL/GYN (CUSTOM PROCEDURE TRAY) ×10 IMPLANT
PACK SURGICAL SETUP 50X90 (CUSTOM PROCEDURE TRAY) ×3 IMPLANT
PAD ABD 8X10 STRL (GAUZE/BANDAGES/DRESSINGS) ×8 IMPLANT
PAD ARMBOARD 7.5X6 YLW CONV (MISCELLANEOUS) ×12 IMPLANT
PENCIL BUTTON HOLSTER BLD 10FT (ELECTRODE) ×5 IMPLANT
PIN SAFETY STERILE (MISCELLANEOUS) ×5 IMPLANT
PLASMABLADE 3.0S (MISCELLANEOUS) ×5
SET ASEPTIC TRANSFER (MISCELLANEOUS) ×2 IMPLANT
SET INTRODUCER 12FR PACEMAKER (SHEATH) IMPLANT
SET SHEATH INTRODUCER 10FR (MISCELLANEOUS) IMPLANT
SHEATH COOK PEEL AWAY SET 9F (SHEATH) IMPLANT
SPECIMEN JAR X LARGE (MISCELLANEOUS) ×5 IMPLANT
SPONGE LAP 18X18 X RAY DECT (DISPOSABLE) ×2 IMPLANT
SUT ETHILON 3 0 FSL (SUTURE) ×3 IMPLANT
SUT MNCRL AB 4-0 PS2 18 (SUTURE) ×17 IMPLANT
SUT MON AB 3-0 SH 27 (SUTURE) ×15
SUT MON AB 3-0 SH27 (SUTURE) ×6 IMPLANT
SUT MON AB 5-0 PS2 18 (SUTURE) ×12 IMPLANT
SUT PDS AB 2-0 CT1 27 (SUTURE) ×18 IMPLANT
SUT PROLENE 2 0 SH 30 (SUTURE) ×10 IMPLANT
SUT SILK 2 0 (SUTURE) ×5
SUT SILK 2-0 18XBRD TIE 12 (SUTURE) IMPLANT
SUT SILK 4 0 PS 2 (SUTURE) ×5 IMPLANT
SUT VIC AB 3-0 54X BRD REEL (SUTURE) IMPLANT
SUT VIC AB 3-0 BRD 54 (SUTURE)
SUT VIC AB 3-0 SH 18 (SUTURE) ×5 IMPLANT
SUT VIC AB 3-0 SH 27 (SUTURE) ×5
SUT VIC AB 3-0 SH 27XBRD (SUTURE) ×3 IMPLANT
SYR 10ML LL (SYRINGE) IMPLANT
SYR 20ML ECCENTRIC (SYRINGE) ×10 IMPLANT
SYR 5ML LUER SLIP (SYRINGE) ×5 IMPLANT
SYR CONTROL 10ML LL (SYRINGE) ×7 IMPLANT
TOWEL OR 17X24 6PK STRL BLUE (TOWEL DISPOSABLE) ×8 IMPLANT
TOWEL OR 17X26 10 PK STRL BLUE (TOWEL DISPOSABLE) ×10 IMPLANT
TRAY FOLEY W/METER SILVER 14FR (SET/KITS/TRAYS/PACK) ×2 IMPLANT
TUBE CONNECTING 12'X1/4 (SUCTIONS) ×1
TUBE CONNECTING 12X1/4 (SUCTIONS) ×1 IMPLANT

## 2017-06-20 NOTE — H&P (Signed)
Alexis Underwood is an 41 y.o. female.   Chief Complaint: Left breast cancer HPI: The patient is a 40 y.o. yrs old wf here for pre operative history and physical prior to LEFT breast reconstruction with tissue expander and dermal matrix.  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  The lesion area measures 9.3 cm. The biopsy sites are well healed.  She has grade II ptosis of both breasts.  The sternal notch to nipple on the left is 26 cm and 27 cm on the right.  She is 5 feet 1 inch tall.  Weight is 139 pounds.  Pre op bra is ~ 15 D.  She is interested in a left mastectomy with immediate reconstruction.   MRI bilateral breasts 05/29/17 : 1. Biopsy proven malignancy involving the upper central and upper outer left breast with extensive contiguous and non contiguous areas of clumped non mass enhancement measuring up to at least 8.8 cm.2. Indeterminate 5 mm enhancing mass in the upper central right breast anterior depth. Biopsy of right breast lesion showed- small intraductal papilloma and plans are for right breast lumpectomy at the time of the left mastectomy.   Past Medical History:  Diagnosis Date  . Breast cancer ([place])   . Family history of breast cancer   . Family history of throat cancer   . Hyperlipidemia   . SVD (spontaneous vaginal delivery)     Past Surgical History:  Procedure Laterality Date  . BREAST BIOPSY Left 04/18/2017   Malignant  . BREAST BIOPSY Right 06/11/2017  . COLONOSCOPY    . WISDOM TOOTH EXTRACTION      Family History  Problem Relation Age of Onset  . Heart attack Mother   . Hyperlipidemia Mother   . Hypertension Mother   . Heart failure Mother   . Breast cancer Paternal Grandmother 75  . Heart disease Maternal Grandfather        died in 32's  .  Throat cancer Paternal Grandfather 22   Social History:  reports that  has never smoked. she has never used smokeless tobacco. She reports that she does not drink alcohol or use drugs.  Allergies: No Known Allergies  Medications Prior to Admission  Medication Sig Dispense Refill  . acetaminophen (TYLENOL) 325 MG tablet Take 650 mg by mouth daily as needed for headache.    . lovastatin (MEVACOR) 40 MG tablet Take 40 mg by mouth every evening.    Marland Kitchen dextromethorphan-guaiFENesin (MUCINEX DM) 30-600 MG 12hr tablet Take 1 tablet by mouth 2 (two) times daily as needed for cough.    . lidocaine-prilocaine (EMLA) cream Apply 1 application topically daily as needed (port access).     . tamoxifen (NOLVADEX) 20 MG tablet Take 1 tablet (20 mg total) by mouth daily. (Patient not taking: Reported on 06/14/2017) 30 tablet 0    No results found for this or any previous visit (from the past 48 hour(s)). Nm Sentinel Node Inj-no Rpt (breast)  Result Date: 06/20/2017 Sulfur colloid was injected by the nuclear medicine technologist for melanoma sentinel node.    Review of Systems  Constitutional: Negative.   HENT: Negative.   Eyes: Negative.   Respiratory: Negative.   Cardiovascular: Negative.   Gastrointestinal: Negative.   Genitourinary: Negative.   Musculoskeletal: Negative.   Skin: Negative.  Neurological: Negative.   Psychiatric/Behavioral: Negative.     Blood pressure (!) 172/98, pulse 96, temperature 98.3 F (36.8 C), temperature source Oral, resp. rate 18, height _0  (1.549 m), weight 62.1 kg (137 lb), SpO2 99 %. Physical Exam  Constitutional: She is oriented to person, place, and time. She appears well-developed and well-nourished.  HENT:  Head: Normocephalic and atraumatic.  Eyes: Conjunctivae and EOM are normal. Pupils are equal, round, and reactive to light.  Cardiovascular: Normal rate.  Respiratory: Effort normal.  GI: Soft. She exhibits no distension.  Neurological: She is  alert and oriented to person, place, and time.  Skin: Skin is warm.  Psychiatric: She has a normal mood and affect. Her behavior is normal. Judgment and thought content normal.     Assessment/Plan Plan left breast immediate reconstruction with placement of expander and FlexHD.  The risks that can be encountered with and after placement of a breast expander placement were discussed and include the following but not limited to these: bleeding, infection, delayed healing, anesthesia risks, skin sensation changes, injury to structures including nerves, blood vessels, and muscles which may be temporary or permanent, allergies to tape, suture materials and glues, blood products, topical preparations or injected agents, skin contour irregularities, skin discoloration and swelling, deep vein thrombosis, cardiac and pulmonary complications, pain, which may persist, fluid accumulation, wrinkling of the skin over the expander, changes in nipple or breast sensation, expander leakage or rupture, faulty position of the expander, persistent pain, formation of tight scar tissue around the expander (capsular contracture), possible need for revisional surgery or staged procedures. The patients questions were answered and she desires to proceed and consent was obtained.   [place], DO 06/20/2017, 9:50 AM

## 2017-06-20 NOTE — H&P (Signed)
Alexis Underwood  Location: [facility] Patient #: [id number] DOB: 05/11/1976 Undefined / Language: Cleophus Molt / Race: White Female   History of Present Illness  The patient is a 41 year old female who presents with breast cancer. we are asked to see the patient in consultation by Dr. Isidore Moos to evaluate her for a new left breast cancer. The patient is a 41 year old white female who presents with abnormal calcifications on a screening mammogram. The calcification stretched from the upper outer quadrant to the nipple and measured about 9.3 cm. The axilla looked negative. There were benign-appearing cysts on the right side. The calcification was biopsied in 3 places one of which showed a grade 2-3 invasive ductal cancer and the other 2 areas were ductal carcinoma in situ. The cancer was a triple positive with a Ki-67 45%. She denies any breast pain or discharge from the nipple. She does not smoke.   Past Surgical History Breast Biopsy  Left. Oral Surgery   Diagnostic Studies History  Colonoscopy  5-10 years ago Mammogram  within last year Pap Smear  1-5 years ago  Medication History Medications Reconciled  Social History  Caffeine use  Carbonated beverages, Coffee. No alcohol use  No drug use  Tobacco use  Never smoker.  Family History  Colon Polyps  Mother. Diabetes Mellitus  Father. Hypertension  Father, Mother.  Pregnancy / Birth History  Age at menarche  79 years. Gravida  1 Maternal age  47-35 Para  1 Regular periods   Other Problems  Hypercholesterolemia  Kidney Stone     Review of Systems  General Not Present- Appetite Loss, Chills, Fatigue, Fever, Night Sweats, Weight Gain and Weight Loss. Skin Present- Dryness. Not Present- Change in Wart/Mole, Hives, Jaundice, New Lesions, Non-Healing Wounds, Rash and Ulcer. HEENT Present- Wears glasses/contact lenses. Not Present- Earache, Hearing Loss, Hoarseness, Nose Bleed, Oral Ulcers, Ringing  in the Ears, Seasonal Allergies, Sinus Pain, Sore Throat, Visual Disturbances and Yellow Eyes. Respiratory Not Present- Bloody sputum, Chronic Cough, Difficulty Breathing, Snoring and Wheezing. Breast Present- Breast Mass. Not Present- Breast Pain, Nipple Discharge and Skin Changes. Cardiovascular Not Present- Chest Pain, Difficulty Breathing Lying Down, Leg Cramps, Palpitations, Rapid Heart Rate, Shortness of Breath and Swelling of Extremities. Gastrointestinal Not Present- Abdominal Pain, Bloating, Bloody Stool, Change in Bowel Habits, Chronic diarrhea, Constipation, Difficulty Swallowing, Excessive gas, Gets full quickly at meals, Hemorrhoids, Indigestion, Nausea, Rectal Pain and Vomiting. Female Genitourinary Not Present- Frequency, Nocturia, Painful Urination, Pelvic Pain and Urgency. Musculoskeletal Not Present- Back Pain, Joint Pain, Joint Stiffness, Muscle Pain, Muscle Weakness and Swelling of Extremities. Neurological Not Present- Decreased Memory, Fainting, Headaches, Numbness, Seizures, Tingling, Tremor, Trouble walking and Weakness. Psychiatric Not Present- Anxiety, Bipolar, Change in Sleep Pattern, Depression, Fearful and Frequent crying. Endocrine Not Present- Cold Intolerance, Excessive Hunger, Hair Changes, Heat Intolerance, Hot flashes and New Diabetes. Hematology Not Present- Blood Thinners, Easy Bruising, Excessive bleeding, Gland problems, HIV and Persistent Infections.   Physical Exam  General Mental Status-Alert. General Appearance-Consistent with stated age. Hydration-Well hydrated. Voice-Normal.  Head and Neck Head-normocephalic, atraumatic with no lesions or palpable masses. Trachea-midline. Thyroid Gland Characteristics - normal size and consistency.  Eye Eyeball - Bilateral-Extraocular movements intact. Sclera/Conjunctiva - Bilateral-No scleral icterus.  Chest and Lung Exam Chest and lung exam reveals -quiet, even and easy respiratory effort  with no use of accessory muscles and on auscultation, normal breath sounds, no adventitious sounds and normal vocal resonance. Inspection Chest Wall - Normal. Back - normal.  Breast  Note: there is some palpable bruising in the upper outer left breast. Other than this there is no palpable mass in either breast. There is no palpable axillary, supraclavicular, or cervical lymphadenopathy   Cardiovascular Cardiovascular examination reveals -normal heart sounds, regular rate and rhythm with no murmurs and normal pedal pulses bilaterally.  Abdomen Inspection Inspection of the abdomen reveals - No Hernias. Skin - Scar - no surgical scars. Palpation/Percussion Palpation and Percussion of the abdomen reveal - Soft, Non Tender, No Rebound tenderness, No Rigidity (guarding) and No hepatosplenomegaly. Auscultation Auscultation of the abdomen reveals - Bowel sounds normal.  Neurologic Neurologic evaluation reveals -alert and oriented x 3 with no impairment of recent or remote memory. Mental Status-Normal.  Musculoskeletal Normal Exam - Left-Upper Extremity Strength Normal and Lower Extremity Strength Normal. Normal Exam - Right-Upper Extremity Strength Normal and Lower Extremity Strength Normal.  Lymphatic Head & Neck  General Head & Neck Lymphatics: Bilateral - Description - Normal. Axillary  General Axillary Region: Bilateral - Description - Normal. Tenderness - Non Tender. Femoral & Inguinal  Generalized Femoral & Inguinal Lymphatics: Bilateral - Description - Normal. Tenderness - Non Tender.    Assessment & Plan  MALIGNANT NEOPLASM OF UPPER-OUTER QUADRANT OF LEFT BREAST IN FEMALE, ESTROGEN RECEPTOR POSITIVE (C50.412) Impression: the patient appears to have a large area of ductal carcinoma in situ with some invasive cancer in the upper outer left breast. Because of the size of the area involved I would recommend a mastectomy. She is also a good candidate for sentinel node  mapping. Since she is HER-2 positive she is also a candidate for adjuvant chemotherapy and will need a Port-A-Cath. She is also interested in reconstruction. I will refer her to plastic surgery and then coordinate with them for surgical planning. I have discussed with her in detail the risks and benefits of the operation as well as some of the technical aspects and she understands and wishes to proceed Current Plans Pt Education - Breast cancer: discussed with patient and provided information. Referred to Surgery - Plastic, for evaluation and follow up (Plastic Surgery). Routine.

## 2017-06-20 NOTE — Progress Notes (Signed)
1450 Received pt from PACU, A&O x4. Left breast dressing dry and intact, abd binder in place. Pt denies pain at this time.

## 2017-06-20 NOTE — Anesthesia Procedure Notes (Addendum)
Anesthesia Regional Block: Pectoralis block   Pre-Anesthetic Checklist: ,, timeout performed, Correct Patient, Correct Site, Correct Laterality, Correct Procedure, Correct Position, site marked, Risks and benefits discussed,  Surgical consent,  Pre-op evaluation,  At surgeon's request and post-op pain management  Laterality: Left and Upper  Prep: chloraprep       Needles:   Needle Type: Echogenic Stimulator Needle     Needle Length: 9cm  Needle Gauge: 21   Needle insertion depth: 6 cm   Additional Needles:   Narrative:  Start time: 06/20/2017 7:55 AM End time: 06/20/2017 8:10 AM Injection made incrementally with aspirations every 5 mL.  Performed by: Personally  Anesthesiologist: Rica Koyanagi, MD

## 2017-06-20 NOTE — Anesthesia Postprocedure Evaluation (Signed)
Anesthesia Post Note  Patient: Alexis Underwood  Procedure(s) Performed: LEFT MASTECTOMY WITH LEFT SENTINEL LYMPH NODE BIOPSY (Left Breast) INSERTION PORT-A-CATH (Right Chest) LEFT BREAST RECONSTRUCTION WITH PLACEMENT OF TISSUE EXPANDER AND FLEX HD (ACELLULAR HYDRATED DERMIS) (Left )     Patient location during evaluation: PACU Anesthesia Type: Regional and General Level of consciousness: awake and sedated Pain management: pain level controlled Vital Signs Assessment: post-procedure vital signs reviewed and stable Respiratory status: spontaneous breathing, nonlabored ventilation, respiratory function stable and patient connected to nasal cannula oxygen Cardiovascular status: blood pressure returned to baseline and stable Postop Assessment: no apparent nausea or vomiting Anesthetic complications: no    Last Vitals:  Vitals:   06/20/17 1415 06/20/17 1430  BP: 139/90 133/84  Pulse: 98 92  Resp: 12 15  Temp:  36.5 C  SpO2: 100% 100%    Last Pain:  Vitals:   06/20/17 1415  TempSrc:   PainSc: 4                  Korion Cuevas,JAMES TERRILL

## 2017-06-20 NOTE — Op Note (Addendum)
Op report    DATE OF OPERATION:  06/20/2017  LOCATION: [facility] Room  SURGICAL DIVISION: Plastic Surgery  PREOPERATIVE DIAGNOSES:  1.Left Breast cancer.    POSTOPERATIVE DIAGNOSES:  1. Left Breast cancer.   PROCEDURE:  1. Left immediate breast reconstruction with placement of Acellular Dermal Matrix and tissue expanders.  SURGEON: Tilton Marsalis Sanger Kinleigh Nault, DO  ANESTHESIA:  General.   COMPLICATIONS: None.   IMPLANTS: Left - Mentor 375 cc. Ref #[id number].  Serial Number [id number]; 250 cc of saline placed Acellular Dermal Matrix 4 x 16 cm  INDICATIONS FOR PROCEDURE:  The patient, Alexis Underwood, is a 41 y.o. female born on 05/04/1976, is here for  immediate first stage breast reconstruction with placement of left tissue expander and Acellular dermal matrix. MRN: [id number]  CONSENT:  Informed consent was obtained directly from the patient. Risks, benefits and alternatives were fully discussed. Specific risks including but not limited to bleeding, infection, hematoma, seroma, scarring, pain, implant infection, implant extrusion, capsular contracture, asymmetry, wound healing problems, and need for further surgery were all discussed. The patient did have an ample opportunity to have her questions answered to her satisfaction.   DESCRIPTION OF PROCEDURE:  The patient was taken to the operating room by the general surgery team. SCDs were placed and IV antibiotics were given. The patient's chest was prepped and draped in a sterile fashion. A time out was performed and the implants to be used were identified.  A left mastectomy were performed.  Once the general surgery team had completed their portion of the case the patient was rendered to the plastic and reconstructive surgery team.  Left: The pectoralis major muscle was lifted from the chest wall with release of the lateral edge and lateral inframammary fold.  The pocket was irrigated with antibiotic solution and  hemostasis was achieved with electrocautery.  The ADM was then prepared according to the manufacture guidelines and slits placed to help with postoperative fluid management.  The ADM was then sutured to the inferior and lateral edge of the inframammary fold with 2-0 PDS starting with an interrupted stitch and then a running stitch.  The lateral portion was sutured to with interrupted sutures after the expander was placed.  The expander was prepared according to the manufacture guidelines, the air evacuated and then it was placed under the ADM and pectoralis major muscle.  The inferior and lateral tabs were used to secure the expander to the chest wall with 2-0 PDS.  The drain was placed at the inframammary fold over the ADM and secured to the skin with 3-0 Silk.    The deep layers were closed with 3-0 Monocryl followed by 4-0 Monocryl.  The skin was closed with 5-0 Monocryl and then dermabond was applied.  The ABDs and breast binder were placed.  The patient tolerated the procedure well and there were no complications.  The patient was allowed to wake from anesthesia and taken to the recovery room in satisfactory condition.

## 2017-06-20 NOTE — Op Note (Signed)
06/20/2017  12:01 PM  PATIENT:  Alexis Underwood  41 y.o. female  PRE-OPERATIVE DIAGNOSIS:  LEFT BREAST CANCER  POST-OPERATIVE DIAGNOSIS:  LEFT BREAST CANCER  PROCEDURE:  Procedure(s): LEFT MASTECTOMY WITH LEFT SENTINEL LYMPH NODE BIOPSY (Left) INSERTION PORT-A-CATH (Right)   SURGEON:  Surgeon(s) and Role: Panel 1:    * Jovita Kussmaul, MD - Primary Panel 2:    * Dillingham, Loel Lofty, DO - Primary  PHYSICIAN ASSISTANT:   ASSISTANTS: Sharyn Dross, RNFA   ANESTHESIA:   general  EBL:  50 mL   BLOOD ADMINISTERED:none  DRAINS: none   LOCAL MEDICATIONS USED:  MARCAINE     SPECIMEN:  Source of Specimen:  left mastectomy and sentinel nodes  DISPOSITION OF SPECIMEN:  PATHOLOGY  COUNTS:  YES  TOURNIQUET:  * No tourniquets in log *  DICTATION: .Dragon Dictation   After informed consent was obtained the patient was brought to the operating room and placed in the supine position on the operating table.  After adequate induction of general anesthesia roll was placed between the patient's shoulder blades to extend the shoulder slightly.  The right chest wall area was then prepped with ChloraPrep, allowed to dry, and draped in usual sterile manner.  An appropriate timeout was performed.  The patient was placed in Trendelenburg position.  The area lateral to the bend of the clavicle was infiltrated with quarter percent Marcaine.  A large bore needle from the port kit was used to slide beneath the bend of the clavicle heading towards the sternal notch and in doing so I was able to access the right subclavian vein without difficulty.  A wire was fed through the needle using the Seldinger technique without difficulty.  The wire was confirmed in the central venous system using real-time fluoroscopy.  Next a small incision was made at the wire entry site.  The incision was carried through the skin and subcutaneous tissue sharply with the electrocautery.  A subcutaneous pocket was created  inferior to the incision.  The tubing was then placed on the reservoir.  The reservoir was placed in the pocket and the length of the tubing was estimated using real-time fluoroscopy and cut to the appropriate length.  Next a sheath and dilator were fed over the wire using the Seldinger technique without difficulty.  The dilator and wire were removed from the patient.  The tubing was placed through the sheath as far as it would go and then gently held in place while the sheath was cracked and separated.  Another real-time fluoroscopy image showed the tip of the catheter to be in the distal superior vena cava.  The tubing was then permanently anchored to the reservoir.  The reservoir was anchored in the pocket with 2 2-0 Prolene stitches.  The port was then aspirated and it aspirated blood easily.  The port was then flushed initially with a dilute heparin solution and then with a more concentrated heparin solution.  The subcutaneous tissue was then closed over the port with interrupted 3-0 Vicryl stitches.  The skin was then closed with a running 4-0 Monocryl subcuticular stitch.  Dermabond dressings were applied.  At this point the drapes were removed.  Next the bilateral chest, breast, and axillary areas were prepped with ChloraPrep, allowed to dry, and draped in usual sterile manner.  An appropriate timeout was performed.  Attention was turned to the left breast.  An elliptical incision was made around the nipple and areole a complex in order to  preserve skin.  The incision was carried through the skin and subcutaneous tissue sharply with the plasma blade.  Breast x-ray used to elevate the skin flaps anteriorly towards the saline.  Thin skin flaps were then created circumferentially between the breast tissue and subcutaneous fat.  This dissection was carried all the way to the chest wall.  Next the breast was removed from the pectoralis muscle with the pectoralis fascia also with the plasma blade.  Once this was  accomplished the breast was then removed from the patient and marked with a stitch on the lateral skin.  Earlier in the day the patient underwent injection of 1 mCi of technetium sulfur colloid in the subareolar position of the left breast.  The neoprobe was set to technetium in an area of radioactivity was readily identified in the left axilla.  Dissection was then carried into the left deep axillary space under the direction of the neoprobe until I was able to identify 3 hot lymph nodes.  These lymph nodes were excised sharply with the plasma blade and the lymphatics were controlled with clips.  Ex vivo counts on these nodes ranged from [phone].  These sentinel nodes were sent to pathology for further evaluation.  The wound was then irrigated with copious amounts of saline.  Couple small perforating vessels were controlled with figure-of-eight 3-0 Vicryl stitches.  The wound was then examined and found to be hemostatic.  The wound was packed with a moistened lap sponge.  The wound was covered with a sterile green towel.  At this point the operation was turned over to Dr. Marla Roe for the reconstruction.  Her portion will be dictated separately.  The patient had tolerated the procedure well and was in stable condition.  PLAN OF CARE: Admit for overnight observation  PATIENT DISPOSITION:  PACU - hemodynamically stable.   Delay start of Pharmacological VTE agent (>24hrs) due to surgical blood loss or risk of bleeding: no

## 2017-06-20 NOTE — Interval H&P Note (Signed)
History and Physical Interval Note:  06/20/2017 8:38 AM  Alexis Underwood  has presented today for surgery, with the diagnosis of LEFT BREAST CANCER  The various methods of treatment have been discussed with the patient and family. After consideration of risks, benefits and other options for treatment, the patient has consented to  Procedure(s): LEFT MASTECTOMY WITH LEFT SENTINEL LYMPH NODE BIOPSY (Left) INSERTION PORT-A-CATH (N/A) LEFT BREAST RECONSTRUCTION WITH PLACEMENT OF TISSUE EXPANDER AND FLEX HD (ACELLULAR HYDRATED DERMIS) (Left) as a surgical intervention .  The patient's history has been reviewed, patient examined, no change in status, stable for surgery.  I have reviewed the patient's chart and labs.  Questions were answered to the patient's satisfaction.     TOTH III,PAUL S

## 2017-06-20 NOTE — Transfer of Care (Signed)
Immediate Anesthesia Transfer of Care Note  Patient: Alexis Underwood  Procedure(s) Performed: LEFT MASTECTOMY WITH LEFT SENTINEL LYMPH NODE BIOPSY (Left Breast) INSERTION PORT-A-CATH (Right Chest) LEFT BREAST RECONSTRUCTION WITH PLACEMENT OF TISSUE EXPANDER AND FLEX HD (ACELLULAR HYDRATED DERMIS) (Left )  Patient Location: PACU  Anesthesia Type:General and Regional  Level of Consciousness: awake, alert , oriented and patient cooperative  Airway & Oxygen Therapy: Patient Spontanous Breathing and Patient connected to nasal cannula oxygen  Post-op Assessment: Report given to RN and Post -op Vital signs reviewed and stable  Post vital signs: Reviewed and stable  Last Vitals:  Vitals:   06/20/17 0705 06/20/17 1313  BP: (!) 172/98   Pulse: 96 (!) (P) 118  Resp: 18 (!) (P) 21  Temp: 36.8 C (!) (P) 36.4 C  SpO2: 99% (P) 97%    Last Pain:  Vitals:   06/20/17 1313  TempSrc:   PainSc: (P) 0-No pain      Patients Stated Pain Goal: 2 (94/32/76 1470)  Complications: No apparent anesthesia complications

## 2017-06-20 NOTE — Anesthesia Preprocedure Evaluation (Addendum)
Anesthesia Evaluation  Patient identified by MRN, date of birth, ID band Patient awake    Reviewed: Allergy & Precautions, NPO status , Patient's Chart, lab work & pertinent test results  Airway Mallampati: II  TM Distance: >3 FB Neck ROM: Full    Dental no notable dental hx. (+) Dental Advisory Given   Pulmonary neg pulmonary ROS,    breath sounds clear to auscultation       Cardiovascular negative cardio ROS   Rhythm:Regular Rate:Normal     Neuro/Psych negative neurological ROS  negative psych ROS   GI/Hepatic negative GI ROS, Neg liver ROS,   Endo/Other  negative endocrine ROSBreast ca noted  Renal/GU negative Renal ROS  negative genitourinary   Musculoskeletal negative musculoskeletal ROS (+)   Abdominal   Peds negative pediatric ROS (+)  Hematology negative hematology ROS (+)   Anesthesia Other Findings   Reproductive/Obstetrics negative OB ROS                          Anesthesia Physical Anesthesia Plan  ASA: I  Anesthesia Plan: General   Post-op Pain Management:  Regional for Post-op pain   Induction: Intravenous  PONV Risk Score and Plan: Treatment may vary due to age or medical condition and Dexamethasone  Airway Management Planned: Oral ETT  Additional Equipment:   Intra-op Plan:   Post-operative Plan: Extubation in OR  Informed Consent: I have reviewed the patients History and Physical, chart, labs and discussed the procedure including the risks, benefits and alternatives for the proposed anesthesia with the patient or authorized representative who has indicated his/her understanding and acceptance.   Dental advisory given  Plan Discussed with: CRNA  Anesthesia Plan Comments:         Anesthesia Quick Evaluation

## 2017-06-20 NOTE — Anesthesia Procedure Notes (Signed)
Procedure Name: Intubation Date/Time: 06/20/2017 8:58 AM Performed by: Shirlyn Goltz, CRNA Pre-anesthesia Checklist: Patient identified, Emergency Drugs available, Suction available and Patient being monitored Patient Re-evaluated:Patient Re-evaluated prior to induction Oxygen Delivery Method: Circle system utilized Preoxygenation: Pre-oxygenation with 100% oxygen Induction Type: IV induction Ventilation: Mask ventilation without difficulty Laryngoscope Size: Mac and 3 Grade View: Grade I Tube type: Oral Tube size: 7.0 mm Number of attempts: 1 Airway Equipment and Method: Stylet Placement Confirmation: ETT inserted through vocal cords under direct vision,  positive ETCO2 and breath sounds checked- equal and bilateral Secured at: 21 cm Tube secured with: Tape Dental Injury: Teeth and Oropharynx as per pre-operative assessment

## 2017-06-21 ENCOUNTER — Encounter (HOSPITAL_COMMUNITY): Payer: Self-pay | Admitting: General Surgery

## 2017-06-21 DIAGNOSIS — C50412 Malignant neoplasm of upper-outer quadrant of left female breast: Secondary | ICD-10-CM | POA: Diagnosis not present

## 2017-06-21 NOTE — Discharge Summary (Signed)
Physician Discharge Summary  Patient ID: Alexis Underwood MRN: [id number] DOB/AGE: [birth year] 41 y.o.  Admit date: 06/20/2017 Discharge date: 06/21/2017  Admission Diagnoses:  Discharge Diagnoses:  Active Problems:   Breast cancer [facility])   Discharged Condition: good  Hospital Course: The patient was taken to the OR for treatment of breast cancer.  She underwent a mastectomy and immediate reconstruction.  She was managed on the surgical unit and did very well.  She was walking, tolerating po intake without difficulty.    Consults: None  Significant Diagnostic Studies: none  Treatments: surgery  Discharge Exam: Blood pressure (!) 141/95, pulse 82, temperature 98.1 F (36.7 C), temperature source Oral, resp. rate 16, height 5\' 1"  (1.549 m), weight 62.1 kg (137 lb), last menstrual period 06/13/2017, SpO2 100 %. General appearance: alert, cooperative and no distress  Disposition: 01-Home or Self Care  Discharge Instructions    Call MD for:  difficulty breathing, headache or visual disturbances   Complete by:  As directed    Call MD for:  persistant nausea and vomiting   Complete by:  As directed    Call MD for:  redness, tenderness, or signs of infection (pain, swelling, redness, odor or green/yellow discharge around incision site)   Complete by:  As directed    Call MD for:  severe uncontrolled pain   Complete by:  As directed    Call MD for:  temperature >100.4   Complete by:  As directed    Diet general   Complete by:  As directed    Discharge wound care:   Complete by:  As directed    Drain care.   Driving Restrictions   Complete by:  As directed    No driving while on pain medication.   Increase activity slowly   Complete by:  As directed    Lifting restrictions   Complete by:  As directed    No heavy lifting.     Allergies as of 06/21/2017   No Known Allergies     Medication List    TAKE these medications   acetaminophen 325 MG tablet Commonly known as:   TYLENOL Take 650 mg by mouth daily as needed for headache.   dextromethorphan-guaiFENesin 30-600 MG 12hr tablet Commonly known as:  MUCINEX DM Take 1 tablet by mouth 2 (two) times daily as needed for cough.   lidocaine-prilocaine cream Commonly known as:  EMLA Apply 1 application topically daily as needed (port access).   lovastatin 40 MG tablet Commonly known as:  MEVACOR Take 40 mg by mouth every evening.   tamoxifen 20 MG tablet Commonly known as:  NOLVADEX Take 1 tablet (20 mg total) by mouth daily.            Discharge Care Instructions  (From admission, onward)        Start     Ordered   06/21/17 0000  Discharge wound care:    Comments:  Drain care.   06/21/17 1617     Follow-up Information    Sebastain Fishbaugh, Loel Lofty, DO In 1 week.   Specialty:  Plastic Surgery Contact information: [address] [place] [address] [phone]        Autumn Messing III, MD In 2 weeks.   Specialty:  General Surgery Contact information: [place] [address] [phone]           Signed: Wallace Going 06/21/2017, 4:17 PM

## 2017-06-21 NOTE — Progress Notes (Signed)
Pt discharged to home with her husband.  Discharge instructions given.

## 2017-06-21 NOTE — Progress Notes (Signed)
1 Day Post-Op   Subjective/Chief Complaint: No complaints other than some soreness   Objective: Vital signs in last 24 hours: Temp:  [97.5 F (36.4 C)-98.4 F (36.9 C)] 98.1 F (36.7 C) (12/13 0621) Pulse Rate:  [81-118] 81 (12/13 0621) Resp:  [11-21] 17 (12/13 0621) BP: (112-139)/(71-90) 112/75 (12/13 0621) SpO2:  [97 %-100 %] 99 % (12/13 0621) Last BM Date: 06/19/17  Intake/Output from previous day: 12/12 0701 - 12/13 0700 In: 3761.3 [P.O.:480; I.V.:3081.3; IV Piggyback:200] Out: 4047 [Urine:3835; Drains:112; Blood:100] Intake/Output this shift: Total I/O In: 65 [Other:65] Out: 400 [Urine:400]  General appearance: alert and cooperative Resp: clear to auscultation bilaterally Chest wall: skin flaps look good Cardio: regular rate and rhythm GI: soft, non-tender; bowel sounds normal; no masses,  no organomegaly  Lab Results:  No results for input(s): WBC, HGB, HCT, PLT in the last 72 hours. BMET No results for input(s): NA, K, CL, CO2, GLUCOSE, BUN, CREATININE, CALCIUM in the last 72 hours. PT/INR No results for input(s): LABPROT, INR in the last 72 hours. ABG No results for input(s): PHART, HCO3 in the last 72 hours.  Invalid input(s): PCO2, PO2  Studies/Results: Nm Sentinel Node Inj-no Rpt (breast)  Result Date: 06/20/2017 Sulfur colloid was injected by the nuclear medicine technologist for melanoma sentinel node.   Dg Chest Port 1 View  Result Date: 06/20/2017 CLINICAL DATA:  Status post Port-A-Cath placement. History of breast carcinoma. EXAM: PORTABLE CHEST 1 VIEW COMPARISON:  07/25/2006 FINDINGS: Right subclavian single-lumen Port-A-Cath has been placed with the catheter tip at the SVC/ RA junction. The heart size and mediastinal contours are within normal limits. There is no evidence of pulmonary edema, consolidation, pneumothorax, nodule or pleural fluid. Tissue expander in place within the left chest wall. The visualized skeletal structures are  unremarkable. IMPRESSION: Right-sided port placement with catheter tip at SVC/ RA junction. No acute findings. Electronically Signed   By: Aletta Edouard M.D.   On: 06/20/2017 13:48   Dg C-arm 1-60 Min-no Report  Result Date: 06/20/2017 Fluoroscopy was utilized by the requesting physician.  No radiographic interpretation.    Anti-infectives: Anti-infectives (From admission, onward)   Start     Dose/Rate Route Frequency Ordered Stop   06/20/17 1700  ceFAZolin (ANCEF) IVPB 2g/100 mL premix     2 g 200 mL/hr over 30 Minutes Intravenous Every 8 hours 06/20/17 1513     06/20/17 0932  polymyxin B 500,000 Units, bacitracin 50,000 Units in sodium chloride 0.9 % 500 mL irrigation  Status:  Discontinued       As needed 06/20/17 0932 06/20/17 1310   06/20/17 0702  ceFAZolin (ANCEF) IVPB 2g/100 mL premix  Status:  Discontinued     2 g 200 mL/hr over 30 Minutes Intravenous On call to O.R. 06/20/17 0702 06/20/17 1443   06/20/17 0659  ceFAZolin (ANCEF) IVPB 2g/100 mL premix     2 g 200 mL/hr over 30 Minutes Intravenous On call to O.R. 06/20/17 0659 06/20/17 0900      Assessment/Plan: s/p Procedure(s): LEFT MASTECTOMY WITH LEFT SENTINEL LYMPH NODE BIOPSY (Left) INSERTION PORT-A-CATH (Right) LEFT BREAST RECONSTRUCTION WITH PLACEMENT OF TISSUE EXPANDER AND FLEX HD (ACELLULAR HYDRATED DERMIS) (Left) Advance diet Discharge  LOS: 0 days    TOTH III,Kaliana Albino S 06/21/2017

## 2017-06-27 ENCOUNTER — Telehealth: Payer: Self-pay | Admitting: Hematology and Oncology

## 2017-06-27 ENCOUNTER — Ambulatory Visit (HOSPITAL_BASED_OUTPATIENT_CLINIC_OR_DEPARTMENT_OTHER): Payer: BLUE CROSS/BLUE SHIELD | Admitting: Hematology and Oncology

## 2017-06-27 ENCOUNTER — Encounter: Payer: Self-pay | Admitting: Medical Oncology

## 2017-06-27 VITALS — BP 148/94 | HR 81 | Temp 97.4°F | Resp 18 | Ht 61.0 in | Wt 138.6 lb

## 2017-06-27 DIAGNOSIS — C50412 Malignant neoplasm of upper-outer quadrant of left female breast: Secondary | ICD-10-CM

## 2017-06-27 DIAGNOSIS — Z170 Estrogen receptor positive status [ER+]: Secondary | ICD-10-CM | POA: Diagnosis not present

## 2017-06-27 MED ORDER — ONDANSETRON HCL 8 MG PO TABS: 8.0000 mg | ORAL_TABLET | Freq: Two times a day (BID) | ORAL | 1 refills | Status: DC | PRN

## 2017-06-27 MED ORDER — PROCHLORPERAZINE MALEATE 10 MG PO TABS: 10.0000 mg | ORAL_TABLET | Freq: Four times a day (QID) | ORAL | 1 refills | Status: DC | PRN

## 2017-06-27 MED ORDER — DEXAMETHASONE 4 MG PO TABS: 4.0000 mg | ORAL_TABLET | Freq: Every day | ORAL | 0 refills | Status: DC

## 2017-06-27 MED ORDER — LORAZEPAM 0.5 MG PO TABS: 0.5000 mg | ORAL_TABLET | Freq: Every evening | ORAL | 0 refills | Status: DC | PRN

## 2017-06-27 MED ORDER — LIDOCAINE-PRILOCAINE 2.5-2.5 % EX CREA: TOPICAL_CREAM | CUTANEOUS | 3 refills | Status: DC

## 2017-06-27 NOTE — Assessment & Plan Note (Addendum)
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 I discussed the margins as well as lymph node surgeries. We also discussed the final staging along with previously performed ER/PR and HER-2/neu testing.  Treatment plan: 1. Adjuvant TCH Perjeta followed by Herceptin and Perjeta maintenance for 1 year 2. followed by adjuvant antiestrogen therapy -------------------------------------------------------------------- Chemo counseling: I discussed the risks and benefits of chemotherapy including the risk of hair loss, cytopenias, Herceptin and Perjeta related cardiac issues, diarrhea related to Perjeta, fatigue, loss of taste as well as long-term bone marrow damage and even death.  Patient understands these risks and is willing to proceed with treatment.  Return to clinic in 3-4 weeks to start chemotherapy

## 2017-06-27 NOTE — Telephone Encounter (Signed)
Gave patient AVS and calendar of upcoming January and February 2019 appointments.

## 2017-06-27 NOTE — Progress Notes (Signed)
Patient Care Team: Carol Ada, MD as PCP - General (Family Medicine)  DIAGNOSIS:  Encounter Diagnosis  Name Primary?  . Malignant neoplasm of upper-outer quadrant of left breast in female, estrogen receptor positive ([name])     SUMMARY OF ONCOLOGIC HISTORY:   Malignant neoplasm of upper-outer quadrant of left breast in female, estrogen receptor positive ([name])   04/16/2017 Initial Diagnosis    Screening mammogram detected calcifications left breast UOQ 9.3 cm, at 2:00: 1.4 cm and at 1:00: 8 mm; biopsy of mass at 1:00: IDC grade 2 with DCIS ER 95%, PR 90%,Ki-67 45%, HER-2 positive ratio 5.18; iopsy of the 2:00 mass and calcifications were DCIS with suspicion for microinvasion, T1 cN0 stage IA clinical stage AJCC 8      05/09/2017 Genetic Testing    The patient had genetic testing due to a personal and family history of breast cancer.  The Common Hereditary Cancer Panel was ordered.  The Hereditary Gene Panel offered by [facility] includes sequencing and/or deletion duplication testing of the following 47 genes: APC, ATM, AXIN2, BARD1, BMPR1A, BRCA1, BRCA2, BRIP1, CDH1, CDKN2A (p14ARF), CDKN2A (p16INK4a), CKD4, CHEK2, CTNNA1, DICER1, EPCAM (Deletion/duplication testing only), GREM1 (promoter region deletion/duplication testing only), KIT, MEN1, MLH1, MSH2, MSH3, MSH6, MUTYH, NBN, NF1, NHTL1, PALB2, PDGFRA, PMS2, POLD1, POLE, PTEN, RAD50, RAD51C, RAD51D, SDHB, SDHC, SDHD, SMAD4, SMARCA4. STK11, TP53, TSC1, TSC2, and VHL.  The following genes were evaluated for sequence changes only: SDHA and HOXB13 c.251G>A variant only.  Results- No pathogenic variants identified.  A Variant of Uncertain Significance was identified in the gene APC c.-30626C>G (Promoter 1B).  The date of this test report is 05/09/2017.       06/20/2017 Surgery    Left mastectomy: Multifocal IDC with DCIS tumor size is 1.1, 1.6 and 2.4 cm, margins negative, 0/5 lymph nodes negative, ER 95%, PR 90%, HER-2 positive ratio 5.18,  Ki-67 45% T2N0 stage Ib pathologic stage       CHIEF COMPLIANT: Follow-up after left mastectomy to discuss pathology report  INTERVAL HISTORY: Alexis Underwood is a 41 year old with above-mentioned history of left breast cancer treated with mastectomy and is here today to discuss the pathology report.  She is uncomfortable under the arm and is recovering from recent surgery.  REVIEW OF SYSTEMS:   Constitutional: Denies fevers, chills or abnormal weight loss Eyes: Denies blurriness of vision Ears, nose, mouth, throat, and face: Denies mucositis or sore throat Respiratory: Denies cough, dyspnea or wheezes Cardiovascular: Denies palpitation, chest discomfort Gastrointestinal:  Denies nausea, heartburn or change in bowel habits Skin: Denies abnormal skin rashes Lymphatics: Denies new lymphadenopathy or easy bruising Neurological:Denies numbness, tingling or new weaknesses Behavioral/Psych: Mood is stable, no new changes  Extremities: No lower extremity edema Breast: Left mastectomy with reconstruction All other systems were reviewed with the patient and are negative.  I have reviewed the past medical history, past surgical history, social history and family history with the patient and they are unchanged from previous note.  ALLERGIES:  has No Known Allergies.  MEDICATIONS:  Current Outpatient Medications  Medication Sig Dispense Refill  . acetaminophen (TYLENOL) 325 MG tablet Take 650 mg by mouth daily as needed for headache.    . dextromethorphan-guaiFENesin (MUCINEX DM) 30-600 MG 12hr tablet Take 1 tablet by mouth 2 (two) times daily as needed for cough.    . lidocaine-prilocaine (EMLA) cream Apply 1 application topically daily as needed (port access).     Marland Kitchen lovastatin (MEVACOR) 40 MG tablet Take 40 mg by  mouth every evening.    . tamoxifen (NOLVADEX) 20 MG tablet Take 1 tablet (20 mg total) by mouth daily. (Patient not taking: Reported on 06/14/2017) 30 tablet 0   No current  facility-administered medications for this visit.     PHYSICAL EXAMINATION: ECOG PERFORMANCE STATUS: 1 - Symptomatic but completely ambulatory  Vitals:   06/27/17 1122  BP: (!) 148/94  Pulse: 81  Resp: 18  Temp: (!) 97.4 F (36.3 C)  SpO2: 100%   Filed Weights   06/27/17 1122  Weight: 138 lb 9.6 oz (62.9 kg)    GENERAL:alert, no distress and comfortable SKIN: skin color, texture, turgor are normal, no rashes or significant lesions EYES: normal, Conjunctiva are pink and non-injected, sclera clear OROPHARYNX:no exudate, no erythema and lips, buccal mucosa, and tongue normal  NECK: supple, thyroid normal size, non-tender, without nodularity LYMPH:  no palpable lymphadenopathy in the cervical, axillary or inguinal LUNGS: clear to auscultation and percussion with normal breathing effort HEART: regular rate & rhythm and no murmurs and no lower extremity edema ABDOMEN:abdomen soft, non-tender and normal bowel sounds MUSCULOSKELETAL:no cyanosis of digits and no clubbing  NEURO: alert & oriented x 3 with fluent speech, no focal motor/sensory deficits EXTREMITIES: No lower extremity edema  LABORATORY DATA:  I have reviewed the data as listed   Chemistry      Component Value Date/Time   NA 139 06/13/2017 1330   NA 140 04/25/2017 1219   K 3.2 (L) 06/13/2017 1330   K 3.7 04/25/2017 1219   CL 102 06/13/2017 1330   CO2 27 06/13/2017 1330   CO2 27 04/25/2017 1219   BUN 12 06/13/2017 1330   BUN 12.1 04/25/2017 1219   CREATININE 0.72 06/13/2017 1330   CREATININE 0.9 04/25/2017 1219      Component Value Date/Time   CALCIUM 8.6 (L) 06/13/2017 1330   CALCIUM 9.1 04/25/2017 1219   ALKPHOS 81 04/25/2017 1219   AST 15 04/25/2017 1219   ALT 16 04/25/2017 1219   BILITOT 0.42 04/25/2017 1219       Lab Results  Component Value Date   WBC 5.3 06/13/2017   HGB 13.3 06/13/2017   HCT 39.3 06/13/2017   MCV 96.1 06/13/2017   PLT 293 06/13/2017   NEUTROABS 5.4 04/25/2017     ASSESSMENT & PLAN:  Malignant neoplasm of upper-outer quadrant of left breast in female, estrogen receptor positive ([name]) 06/20/2017 left mastectomy: Multifocal IDC with DCIS tumor size is 1.1, 1.6 and 2.4 cm, margins negative, 0/5 lymph nodes negative,  ER 95%, PR 90%, HER-2 positive ratio 5.18, Ki-67 45%T2N0 stage Ib pathologic stage  Pathology counseling: I discussed the final pathology report of the patient provided  a copy of this report. I discussed the margins as well as lymph node surgeries. We also discussed the final staging along with previously performed ER/PR and HER-2/neu testing.  Treatment plan: 1. Adjuvant TCH Perjeta followed by Herceptin and Perjeta maintenance for 1 year 2. followed by adjuvant antiestrogen therapy -------------------------------------------------------------------- Chemo counseling: I discussed the risks and benefits of chemotherapy including the risk of hair loss, cytopenias, Herceptin and Perjeta related cardiac issues, diarrhea related to Perjeta, fatigue, loss of taste as well as long-term bone marrow damage and even death.  Patient understands these risks and is willing to proceed with treatment.  Return to clinic in 3-4 weeks to start chemotherapy   I spent 25 minutes talking to the patient of which more than half was spent in counseling and coordination of care.  No orders of the defined types were placed in this encounter.  The patient has a good understanding of the overall plan. she agrees with it. she will call with any problems that may develop before the next visit here.   Harriette Ohara, MD 06/27/17

## 2017-06-27 NOTE — Progress Notes (Signed)
START ON PATHWAY REGIMEN - Breast     A cycle is every 21 days:     Docetaxel      Carboplatin      Trastuzumab      Trastuzumab      Pertuzumab      Pertuzumab   **Always confirm dose/schedule in your pharmacy ordering system**    Patient Characteristics: Postoperative without Neoadjuvant Therapy (Pathologic Staging), Invasive Disease, Adjuvant Therapy, HER2 Positive, ER Positive, Node Negative, pT2 or Higher Therapeutic Status: Postoperative without Neoadjuvant Therapy (Pathologic Staging) AJCC Grade: G2 AJCC N Category: pN0 AJCC M Category: cM0 ER Status: Positive (+) AJCC 8 Stage Grouping: IA HER2 Status: Positive (+) Oncotype Dx Recurrence Score: Not Appropriate AJCC T Category: pT2 PR Status: Positive (+) Intent of Therapy: Curative Intent, Discussed with Patient

## 2017-06-28 ENCOUNTER — Encounter: Payer: Self-pay | Admitting: Medical Oncology

## 2017-07-05 ENCOUNTER — Encounter: Payer: Self-pay | Admitting: *Deleted

## 2017-07-11 ENCOUNTER — Other Ambulatory Visit: Payer: BLUE CROSS/BLUE SHIELD

## 2017-07-11 ENCOUNTER — Encounter: Payer: Self-pay | Admitting: *Deleted

## 2017-07-12 ENCOUNTER — Ambulatory Visit: Payer: BLUE CROSS/BLUE SHIELD | Attending: General Surgery | Admitting: Physical Therapy

## 2017-07-12 ENCOUNTER — Other Ambulatory Visit: Payer: BLUE CROSS/BLUE SHIELD

## 2017-07-12 DIAGNOSIS — Z170 Estrogen receptor positive status [ER+]: Secondary | ICD-10-CM | POA: Insufficient documentation

## 2017-07-12 DIAGNOSIS — Z483 Aftercare following surgery for neoplasm: Secondary | ICD-10-CM

## 2017-07-12 DIAGNOSIS — R293 Abnormal posture: Secondary | ICD-10-CM

## 2017-07-12 DIAGNOSIS — M25612 Stiffness of left shoulder, not elsewhere classified: Secondary | ICD-10-CM | POA: Diagnosis present

## 2017-07-12 DIAGNOSIS — C50412 Malignant neoplasm of upper-outer quadrant of left female breast: Secondary | ICD-10-CM | POA: Diagnosis present

## 2017-07-12 NOTE — Therapy (Signed)
[place], [place], [address] Phone: [phone]   Fax:  [phone]  Physical Therapy Evaluation  Patient Details  Name: Alexis Underwood MRN: [id number] Date of Birth: 28-Sep-1975 Referring Provider: Dr. Autumn Messing   Encounter Date: 07/12/2017  PT End of Session - 07/12/17 1150    Visit Number  2    Number of Visits  10    Date for PT Re-Evaluation  08/09/17    PT Start Time  1108    PT Stop Time  1148    PT Time Calculation (min)  40 min    Activity Tolerance  Patient tolerated treatment well    Behavior During Therapy  [facility] for tasks assessed/performed       Past Medical History:  Diagnosis Date  . Breast cancer ([place])   . Family history of breast cancer   . Family history of throat cancer   . History of kidney stones   . Hyperlipidemia   . SVD (spontaneous vaginal delivery)     Past Surgical History:  Procedure Laterality Date  . BREAST BIOPSY Left 04/18/2017   Malignant  . BREAST BIOPSY Right 06/11/2017  . BREAST RECONSTRUCTION WITH PLACEMENT OF TISSUE EXPANDER AND FLEX HD (ACELLULAR HYDRATED DERMIS) Left 06/20/2017   Procedure: LEFT BREAST RECONSTRUCTION WITH PLACEMENT OF TISSUE EXPANDER AND FLEX HD (ACELLULAR HYDRATED DERMIS);  Surgeon: Wallace Going, DO;  Location: [facility];  Service: Plastics;  Laterality: Left;  . COLONOSCOPY    . MASTECTOMY W/ SENTINEL NODE BIOPSY Left 06/20/2017  . MASTECTOMY W/ SENTINEL NODE BIOPSY Left 06/20/2017   Procedure: LEFT MASTECTOMY WITH LEFT SENTINEL LYMPH NODE BIOPSY;  Surgeon: Jovita Kussmaul, MD;  Location: [facility];  Service: General;  Laterality: Left;  . PORTACATH PLACEMENT Right 06/20/2017   Procedure: INSERTION PORT-A-CATH;  Surgeon: Jovita Kussmaul, MD;  Location: [facility];  Service: General;  Laterality: Right;  . WISDOM TOOTH EXTRACTION      There were no vitals filed for this visit.   Subjective Assessment - 07/12/17 1107    Subjective   Patient underwent a left mastectomy and sentinel node biopsy with tissue expander reconstruction on 06/20/17. Five negative lymph nodes were removed. She continues to have her expander filled and her next appointment is 07/17/17. Drains were removed 07/06/17 and reports she is doing well. She starts chemo 07/24/17. No radiation is needed. Chemo will be 6 cycles every 3 weeks with Herceptin for a year.    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 The area of calcs measured 9.3 cm and is triple positive with a Ki67 of 45%. Patient underwent a left mastectomy and sentinel node biopsy with tissue expander reconstruction on 06/21/17. She has no other medical problems.    Patient Stated Goals  Improve shoulder ROM and make sure she's doing ok    Currently in Pain?  Yes    Pain Score  3     Pain Location  Axilla    Pain Orientation  Left    Pain Descriptors / Indicators  Tightness    Pain Onset  1 to 4 weeks ago    Pain Frequency  Intermittent    Aggravating Factors   Reaching overhead    Pain Relieving Factors  rest    Multiple Pain Sites  No         [facility] PT Assessment - 07/12/17 0001  Assessment   Medical Diagnosis  s/p left mastectomy and reconstruction    Referring Provider  Dr. Autumn Messing    Onset Date/Surgical Date  06/20/17    Hand Dominance  Right    Prior Therapy  Baselines      Precautions   Precautions  Other (comment)    Precaution Comments  Recent cancer surgery      Restrictions   Weight Bearing Restrictions  No      Balance Screen   Has the patient fallen in the past 6 months  No    Has the patient had a decrease in activity level because of a fear of falling?   No    Is the patient reluctant to leave their home because of a fear of falling?   No      Home Environment   Living Environment  Private residence    Living Arrangements  Spouse/significant other;Children Husband and 37 y.o. 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    Leisure  Not exercising      Cognition   Overall Cognitive Status  Within Functional Limits for tasks assessed      Posture/Postural Control   Posture/Postural Control  Postural limitations    Postural Limitations  Rounded Shoulders;Forward head      ROM / Strength   AROM / PROM / Strength  AROM      AROM   AROM Assessment Site  Shoulder    Right/Left Shoulder  Left    Left Shoulder Extension  41 Degrees    Left Shoulder Flexion  112 Degrees    Left Shoulder ABduction  93 Degrees    Left Shoulder Internal Rotation  69 Degrees    Left Shoulder External Rotation  87 Degrees      Palpation   Palpation comment  Incision site appears to be well healing        LYMPHEDEMA/ONCOLOGY QUESTIONNAIRE - 07/12/17 1125      Type   Cancer Type  Left breast cancer      Surgeries   Mastectomy Date  06/20/17    Sentinel Lymph Node Biopsy Date  06/20/17    Number Lymph Nodes Removed  5      Treatment   Active Chemotherapy Treatment  No Will begin 07/24/17    Past Chemotherapy Treatment  No    Active Radiation Treatment  No    Past Radiation Treatment  No    Current Hormone Treatment  No    Past Hormone Therapy  No      What other symptoms do you have   Are you Having Heaviness or Tightness  Yes    Are you having Pain  No    Are you having pitting edema  No    Is it Hard or Difficult finding clothes that fit  No    Do you have infections  No    Is there Decreased scar mobility  Yes    [place] Sign  No      Lymphedema Assessments   Lymphedema Assessments  Upper extremities      Right Upper Extremity Lymphedema   10 cm Proximal to Olecranon Process  28 cm    Olecranon Process  24.5 cm    10  cm Proximal to Ulnar Styloid Process  21.8 cm    Just Proximal to Ulnar Styloid  Process  15.1 cm    Across Hand at [employer]  17.3 cm    At [place] of 2nd Digit  5.7 cm      Left Upper Extremity Lymphedema   10 cm Proximal to Olecranon Process  28.1 cm    Olecranon Process  23.9 cm    10 cm Proximal to Ulnar Styloid Process  21.2 cm    Just Proximal to Ulnar Styloid Process  15.1 cm    Across Hand at [employer]  16.5 cm    At [place] of 2nd Digit  5.9 cm        Quick Dash - 07/12/17 0001    Open a tight or new jar  Mild difficulty    Do heavy household chores (wash walls, wash floors)  Mild difficulty    Carry a shopping bag or briefcase  Mild difficulty    Wash your back  Mild difficulty    Use a knife to cut food  Mild difficulty    Recreational activities in which you take some force or impact through your arm, shoulder, or hand (golf, hammering, tennis)  Mild difficulty    During the past week, to what extent has your arm, shoulder or hand problem interfered with your normal social activities with family, friends, neighbors, or groups?  Slightly    During the past week, to what extent has your arm, shoulder or hand problem limited your work or other regular daily activities  Slightly    Arm, shoulder, or hand pain.  None    Tingling (pins and needles) in your arm, shoulder, or hand  Mild    Difficulty Sleeping  Mild difficulty    DASH Score  22.73 %       Objective measurements completed on examination: See above findings.       PT Education - 07/12/17 1148    Education provided  Yes    Education Details  Reviewed HEP given pre-operatively and encouraged her to begin this very gently working in painfree ROM    Person(s) Educated  Patient    Methods  Explanation;Demonstration    Comprehension  Verbalized understanding          PT Long Term Goals - 07/12/17 1153      PT LONG TERM GOAL #1   Title  Patient will be independent with her home exercise program to promote shoulder ROM.    Time  4    Period  Weeks    Status  New      PT  LONG TERM GOAL #2   Title  Increase left shoulder active flexion ROM to >/= 140 degrees for increased ability to reach overhead.    Time  4    Period  Weeks    Status  New      PT LONG TERM GOAL #3   Title  Increase left shoulder active abduction ROM to >/= 140 degrees for increased ability to reach overhead.    Time  4    Period  Weeks    Status  New      PT LONG TERM GOAL #4   Title  Patient will report she is able to do her laundry and >/= 50% of normal household tasks.    Time  4    Period  Weeks    Status  New  PT LONG TERM GOAL #5   Title  Patient will verbalize understanding of risk reduction practices related to lymphedema.    Time  4    Period  [facility] Goals - 04/25/17 1654      Patient will be able to verbalize understanding of pertinent lymphedema risk reduction practices relevant to her diagnosis specifically related to skin care.   Time  1    Period  Days    Status  Achieved      Patient will be able to return demonstrate and/or verbalize understanding of the post-op home exercise program related to regaining shoulder range of motion.   Time  1    Period  Days    Status  Achieved      Patient will be able to verbalize understanding of the importance of attending the postoperative After Breast Cancer Class for further lymphedema risk reduction education and therapeutic exercise.   Time  1    Period  Days    Status  Achieved            Plan - 07/12/17 1150    Clinical Impression Statement  Patient underwent a left mastectomy, sentinel node biopsy (5 negative nodes removed) and had an expander placed for reconstruction on 06/20/17. She has triple positive breast cancer and will begin chemotherapy on 07/24/17. She is doing well overall but has very limited shoulder ROM on the left side and would benefit from PT for that. Her plastic surgeon has been consulted on whether it is ok to begin PT and we are awaiting a response.     Rehab Potential  Excellent    PT Frequency  2x / week    PT Duration  4 weeks    PT Treatment/Interventions  ADLs/Self Care Home Management;Therapeutic exercise;Patient/family education;Passive range of motion;Scar mobilization;Manual techniques;Therapeutic activities    PT Next Visit Plan  Begin gentle ROM left shoulder - PROM and AAROM    Consulted and Agree with Plan of Care  Patient       Patient will benefit from skilled therapeutic intervention in order to improve the following deficits and impairments:  Decreased range of motion, Impaired UE functional use, Pain, Decreased knowledge of precautions, Postural dysfunction, Decreased strength, Decreased scar mobility  Visit Diagnosis: Malignant neoplasm of upper-outer quadrant of left breast in female, estrogen receptor positive ([place]) - Plan: PT plan of care cert/re-cert  Abnormal posture - Plan: PT plan of care cert/re-cert  Stiffness of left shoulder, not elsewhere classified - Plan: PT plan of care cert/re-cert  Aftercare following surgery for neoplasm - Plan: PT plan of care cert/re-cert     Problem List Patient Active Problem List   Diagnosis Date Noted  . Breast cancer ([place]) 06/20/2017  . Genetic testing 05/21/2017  . Family history of breast cancer   . Family history of throat cancer   . Malignant neoplasm of upper-outer quadrant of left breast in female, estrogen receptor positive ([place]) 04/19/2017    Annia Friendly, PT 07/12/17 11:57 AM  [place], [place], [address] Phone: [phone]   Fax:  [phone]  Name: CHENEE MUNNS MRN: [id number] Date of Birth: 01-06-1976

## 2017-07-13 ENCOUNTER — Encounter: Payer: Self-pay | Admitting: Hematology and Oncology

## 2017-07-13 NOTE — Progress Notes (Signed)
Called patient from chemo ed list to introduce myself as her Arboriculturist and to offer available options for her.  Asked patient if her insurance plan renewed this month. She states she thinks so but it is her husband's plan and she wasn't exactly sure. Advised patient of PAF copay relief that currently has open funds for her diagnosis. Asked patient for verbal income information and household size. Patient over the income for a household of 3 for this program.'  Discussed Advertising account executive. Patient also over the income for this grant as well.  Discussed the [employer] First Step copay program for Neulasta and no income is required. Patient interested in enrolling as well as [place] card for copay assistance with Herceptin and Perjeta. Advised patient I would enroll her online and then give her a call back to confirm enrollments.  Enrolled patient in [employer] program for Neulasta with $10,000 and the first one leaving her with no balance and each additional one would only leave her a $5 copay after insurance pays. Patient does not have to do anything on her end as I will monitor account,submit claims and process payments from [place].  Enrolled patient in [place] for copay assistance with Herceptin and Perjeta. These drugs will be covered up to $25,000 each for the calendar year and only leaving her with a $5 copay per drug after insurance.   Called patient to confirm enrollments and advised her she would receive paperwork in the mail and asked that she bring with her to her appointment on 07/24/17 and I will meet with her in person to be sure we have covered everything and that she has an understanding on how the programs work. Patient verbalized understanding and was very appreciative.

## 2017-07-18 ENCOUNTER — Ambulatory Visit: Payer: BLUE CROSS/BLUE SHIELD

## 2017-07-18 DIAGNOSIS — C50412 Malignant neoplasm of upper-outer quadrant of left female breast: Secondary | ICD-10-CM | POA: Diagnosis not present

## 2017-07-18 DIAGNOSIS — Z483 Aftercare following surgery for neoplasm: Secondary | ICD-10-CM

## 2017-07-18 DIAGNOSIS — Z170 Estrogen receptor positive status [ER+]: Secondary | ICD-10-CM

## 2017-07-18 DIAGNOSIS — M25612 Stiffness of left shoulder, not elsewhere classified: Secondary | ICD-10-CM

## 2017-07-18 DIAGNOSIS — R293 Abnormal posture: Secondary | ICD-10-CM

## 2017-07-18 NOTE — Therapy (Signed)
[place], [place], [address] Phone: [phone]   Fax:  [phone]  Physical Therapy Treatment  Patient Details  Name: Alexis Underwood MRN: [id number] Date of Birth: 1976-03-11 Referring Provider: Dr. Autumn Messing   Encounter Date: 07/18/2017  PT End of Session - 07/18/17 1158    Visit Number  3    Number of Visits  10    Date for PT Re-Evaluation  08/09/17    PT Start Time  1107    PT Stop Time  1151    PT Time Calculation (min)  44 min    Activity Tolerance  Patient tolerated treatment well    Behavior During Therapy  [facility] for tasks assessed/performed       Past Medical History:  Diagnosis Date  . Breast cancer ([place])   . Family history of breast cancer   . Family history of throat cancer   . History of kidney stones   . Hyperlipidemia   . SVD (spontaneous vaginal delivery)     Past Surgical History:  Procedure Laterality Date  . BREAST BIOPSY Left 04/18/2017   Malignant  . BREAST BIOPSY Right 06/11/2017  . BREAST RECONSTRUCTION WITH PLACEMENT OF TISSUE EXPANDER AND FLEX HD (ACELLULAR HYDRATED DERMIS) Left 06/20/2017   Procedure: LEFT BREAST RECONSTRUCTION WITH PLACEMENT OF TISSUE EXPANDER AND FLEX HD (ACELLULAR HYDRATED DERMIS);  Surgeon: Wallace Going, DO;  Location: [place];  Service: Plastics;  Laterality: Left;  . COLONOSCOPY    . MASTECTOMY W/ SENTINEL NODE BIOPSY Left 06/20/2017  . MASTECTOMY W/ SENTINEL NODE BIOPSY Left 06/20/2017   Procedure: LEFT MASTECTOMY WITH LEFT SENTINEL LYMPH NODE BIOPSY;  Surgeon: Jovita Kussmaul, MD;  Location: [place];  Service: General;  Laterality: Left;  . PORTACATH PLACEMENT Right 06/20/2017   Procedure: INSERTION PORT-A-CATH;  Surgeon: Jovita Kussmaul, MD;  Location: [place];  Service: General;  Laterality: Right;  . WISDOM TOOTH EXTRACTION      There were no vitals filed for this visit.  Subjective Assessment - 07/18/17 1110    Subjective  Pt just  had another injection into expander yesterday so feeling sore today from that. Woke up around 0130 feeling sore this morning and my Lt posterior upper arm is still really hypersensitive, like tingling feeling.     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 The area of calcs measured 9.3 cm and is triple positive with a Ki67 of 45%. Patient underwent a left mastectomy and sentinel node biopsy with tissue expander reconstruction on 06/21/17. She has no other medical problems.    Patient Stated Goals  Improve shoulder ROM and make sure she's doing ok    Currently in Pain?  No/denies                      [facility] Adult PT Treatment/Exercise - 07/18/17 0001      Manual Therapy   Manual Therapy  Myofascial release;Passive ROM    Myofascial Release  In Supine to Lt axilla during P/ROM, especially with arm in abduction    Passive ROM  In Supine to Lt shoulder into flexion , er and IR, abduction and D2 al to pts tolerance.                   PT Long Term Goals - 07/12/17 1153      PT LONG TERM GOAL #1  Title  Patient will be independent with her home exercise program to promote shoulder ROM.    Time  4    Period  Weeks    Status  New      PT LONG TERM GOAL #2   Title  Increase left shoulder active flexion ROM to >/= 140 degrees for increased ability to reach overhead.    Time  4    Period  Weeks    Status  New      PT LONG TERM GOAL #3   Title  Increase left shoulder active abduction ROM to >/= 140 degrees for increased ability to reach overhead.    Time  4    Period  Weeks    Status  New      PT LONG TERM GOAL #4   Title  Patient will report she is able to do her laundry and >/= 50% of normal household tasks.    Time  4    Period  Weeks    Status  New      PT LONG TERM GOAL #5   Title  Patient will verbalize understanding of risk reduction practices related to lymphedema.    Time  4     Period  [facility] Goals - 04/25/17 1654      Patient will be able to verbalize understanding of pertinent lymphedema risk reduction practices relevant to her diagnosis specifically related to skin care.   Time  1    Period  Days    Status  Achieved      Patient will be able to return demonstrate and/or verbalize understanding of the post-op home exercise program related to regaining shoulder range of motion.   Time  1    Period  Days    Status  Achieved      Patient will be able to verbalize understanding of the importance of attending the postoperative After Breast Cancer Class for further lymphedema risk reduction education and therapeutic exercise.   Time  1    Period  Days    Status  Achieved           Plan - 07/18/17 1159    Clinical Impression Statement  Pt tolerated first session of stretching very well today. Initially she had trouble relaxing with P/ROM but with continued cuing she was able to relax throughout session allowing for much increased end range stretching by end of session. She reported feeling much looser after. Encouraged her to continued HEP stretches from ABC class that she attended Monday and to find ways to work exercises in throughout day (ex. reaching into high shelf and holding onto to shelf to stretch, then turning to side and holding; performing A/ROM a few times in the mirror every time she uses the bathroom) and pt verbalized understanding all this.    Rehab Potential  Excellent    Clinical Impairments Affecting Rehab Potential  None    PT Frequency  2x / week    PT Duration  4 weeks    PT Treatment/Interventions  ADLs/Self Care Home Management;Therapeutic exercise;Patient/family education;Passive range of motion;Scar mobilization;Manual techniques;Therapeutic activities    PT Next Visit Plan  Cont ROM left shoulder - PROM and AAROM, start pulleys and ball up wall.    Consulted and Agree with Plan of Care  Patient        Patient will benefit from skilled therapeutic intervention in order to  improve the following deficits and impairments:  Decreased range of motion, Impaired UE functional use, Pain, Decreased knowledge of precautions, Postural dysfunction, Decreased strength, Decreased scar mobility  Visit Diagnosis: Abnormal posture  Stiffness of left shoulder, not elsewhere classified  Aftercare following surgery for neoplasm  Malignant neoplasm of upper-outer quadrant of left breast in female, estrogen receptor positive ([place])     Problem List Patient Active Problem List   Diagnosis Date Noted  . Breast cancer ([place]) 06/20/2017  . Genetic testing 05/21/2017  . Family history of breast cancer   . Family history of throat cancer   . Malignant neoplasm of upper-outer quadrant of left breast in female, estrogen receptor positive ([place]) 04/19/2017    Otelia Limes, PTA 07/18/2017, 12:06 PM  [place], [place], [address] Phone: [phone]   Fax:  [phone]  Name: Alexis Underwood MRN: [id number] Date of Birth: 08-29-1975

## 2017-07-19 ENCOUNTER — Ambulatory Visit: Payer: BLUE CROSS/BLUE SHIELD

## 2017-07-19 DIAGNOSIS — R293 Abnormal posture: Secondary | ICD-10-CM

## 2017-07-19 DIAGNOSIS — Z170 Estrogen receptor positive status [ER+]: Secondary | ICD-10-CM

## 2017-07-19 DIAGNOSIS — M25612 Stiffness of left shoulder, not elsewhere classified: Secondary | ICD-10-CM

## 2017-07-19 DIAGNOSIS — C50412 Malignant neoplasm of upper-outer quadrant of left female breast: Secondary | ICD-10-CM

## 2017-07-19 DIAGNOSIS — Z483 Aftercare following surgery for neoplasm: Secondary | ICD-10-CM

## 2017-07-19 NOTE — Therapy (Signed)
[place], [place], [address] Phone: [phone]   Fax:  [phone]  Physical Therapy Treatment  Patient Details  Name: Alexis Underwood MRN: [id number] Date of Birth: Dec 25, 1975 Referring Provider: Dr. Autumn Messing   Encounter Date: 07/19/2017  PT End of Session - 07/19/17 1107    Visit Number  4    Number of Visits  10    Date for PT Re-Evaluation  08/09/17    PT Start Time  1024    PT Stop Time  1107    PT Time Calculation (min)  43 min    Activity Tolerance  Patient tolerated treatment well    Behavior During Therapy  [facility] for tasks assessed/performed       Past Medical History:  Diagnosis Date  . Breast cancer ([place])   . Family history of breast cancer   . Family history of throat cancer   . History of kidney stones   . Hyperlipidemia   . SVD (spontaneous vaginal delivery)     Past Surgical History:  Procedure Laterality Date  . BREAST BIOPSY Left 04/18/2017   Malignant  . BREAST BIOPSY Right 06/11/2017  . BREAST RECONSTRUCTION WITH PLACEMENT OF TISSUE EXPANDER AND FLEX HD (ACELLULAR HYDRATED DERMIS) Left 06/20/2017   Procedure: LEFT BREAST RECONSTRUCTION WITH PLACEMENT OF TISSUE EXPANDER AND FLEX HD (ACELLULAR HYDRATED DERMIS);  Surgeon: Wallace Going, DO;  Location: [place];  Service: Plastics;  Laterality: Left;  . COLONOSCOPY    . MASTECTOMY W/ SENTINEL NODE BIOPSY Left 06/20/2017  . MASTECTOMY W/ SENTINEL NODE BIOPSY Left 06/20/2017   Procedure: LEFT MASTECTOMY WITH LEFT SENTINEL LYMPH NODE BIOPSY;  Surgeon: Jovita Kussmaul, MD;  Location: [place];  Service: General;  Laterality: Left;  . PORTACATH PLACEMENT Right 06/20/2017   Procedure: INSERTION PORT-A-CATH;  Surgeon: Jovita Kussmaul, MD;  Location: [place];  Service: General;  Laterality: Right;  . WISDOM TOOTH EXTRACTION      There were no vitals filed for this visit.  Subjective Assessment - 07/19/17 1029    Subjective  I feel  so good today. I was able to go home and take down all my Christmas decorations and I could wash the back of my head/hair, and I slept all night long in my bed which I haven't been able to do for awhile. I just feel really good this morning.     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 The area of calcs measured 9.3 cm and is triple positive with a Ki67 of 45%. Patient underwent a left mastectomy and sentinel node biopsy with tissue expander reconstruction on 06/21/17. She has no other medical problems.    Patient Stated Goals  Improve shoulder ROM and make sure she's doing ok    Currently in Pain?  No/denies                      [facility] Adult PT Treatment/Exercise - 07/19/17 0001      Shoulder Exercises: Pulleys   Flexion  2 minutes    Flexion Limitations  Demonstration, tactile and VCs to decrease Lt scapular compensation    ABduction  2 minutes      Shoulder Exercises: Therapy Ball   Flexion  10 reps With forward lean into end of stretch      Manual Therapy   Manual Therapy  Myofascial release;Passive ROM    Myofascial  Release  In Supine to Lt axilla during P/ROM, especially with arm in abduction and gently to Lt pectoralis insertion and lateral border of exander; also with downward stretch of trunk during abduction stetch for increased release which pt tolerated very well.     Passive ROM  In Supine to Lt shoulder into flexion , er and IR, abduction and D2 al to pts tolerance.                   PT Long Term Goals - 07/12/17 1153      PT LONG TERM GOAL #1   Title  Patient will be independent with her home exercise program to promote shoulder ROM.    Time  4    Period  Weeks    Status  New      PT LONG TERM GOAL #2   Title  Increase left shoulder active flexion ROM to >/= 140 degrees for increased ability to reach overhead.    Time  4    Period  Weeks    Status  New      PT LONG TERM  GOAL #3   Title  Increase left shoulder active abduction ROM to >/= 140 degrees for increased ability to reach overhead.    Time  4    Period  Weeks    Status  New      PT LONG TERM GOAL #4   Title  Patient will report she is able to do her laundry and >/= 50% of normal household tasks.    Time  4    Period  Weeks    Status  New      PT LONG TERM GOAL #5   Title  Patient will verbalize understanding of risk reduction practices related to lymphedema.    Time  4    Period  [facility] Goals - 04/25/17 1654      Patient will be able to verbalize understanding of pertinent lymphedema risk reduction practices relevant to her diagnosis specifically related to skin care.   Time  1    Period  Days    Status  Achieved      Patient will be able to return demonstrate and/or verbalize understanding of the post-op home exercise program related to regaining shoulder range of motion.   Time  1    Period  Days    Status  Achieved      Patient will be able to verbalize understanding of the importance of attending the postoperative After Breast Cancer Class for further lymphedema risk reduction education and therapeutic exercise.   Time  1    Period  Days    Status  Achieved           Plan - 07/19/17 1107    Clinical Impression Statement  Pt continued to tolerated stretching very well and was able to relax with stretching better today. She demonstrated better end P/ROM today than yesterday and pt reported feeling much better overall than she did at beginning of week. Progressed pt to include AA/ROM exercises in the gym today which she tolerated very well.   Overall pt is very pleased with progress she has attained thus far.     Rehab Potential  Excellent    Clinical Impairments Affecting Rehab Potential  None    PT Frequency  2x / week    PT Duration  4 weeks  PT Treatment/Interventions  ADLs/Self Care Home Management;Therapeutic exercise;Patient/family  education;Passive range of motion;Scar mobilization;Manual techniques;Therapeutic activities    PT Next Visit Plan  Cont ROM left shoulder - PROM and AAROM, start pulleys and ball up wall.. Add supine scapular series.    Consulted and Agree with Plan of Care  Patient       Patient will benefit from skilled therapeutic intervention in order to improve the following deficits and impairments:  Decreased range of motion, Impaired UE functional use, Pain, Decreased knowledge of precautions, Postural dysfunction, Decreased strength, Decreased scar mobility  Visit Diagnosis: Abnormal posture  Stiffness of left shoulder, not elsewhere classified  Aftercare following surgery for neoplasm  Malignant neoplasm of upper-outer quadrant of left breast in female, estrogen receptor positive ([place])     Problem List Patient Active Problem List   Diagnosis Date Noted  . Breast cancer ([place]) 06/20/2017  . Genetic testing 05/21/2017  . Family history of breast cancer   . Family history of throat cancer   . Malignant neoplasm of upper-outer quadrant of left breast in female, estrogen receptor positive ([place]) 04/19/2017    Otelia Limes, PTA 07/19/2017, 12:15 PM  [place], [place], [address] Phone: [phone]   Fax:  [phone]  Name: Alexis Underwood MRN: [id number] Date of Birth: 1975-12-01

## 2017-07-23 ENCOUNTER — Encounter: Payer: Self-pay | Admitting: Physical Therapy

## 2017-07-23 ENCOUNTER — Ambulatory Visit: Payer: BLUE CROSS/BLUE SHIELD | Admitting: Physical Therapy

## 2017-07-23 DIAGNOSIS — C50412 Malignant neoplasm of upper-outer quadrant of left female breast: Secondary | ICD-10-CM | POA: Diagnosis not present

## 2017-07-23 DIAGNOSIS — M25612 Stiffness of left shoulder, not elsewhere classified: Secondary | ICD-10-CM

## 2017-07-23 DIAGNOSIS — R293 Abnormal posture: Secondary | ICD-10-CM

## 2017-07-23 NOTE — Therapy (Signed)
[place], [place], [address] Phone: [phone]   Fax:  [phone]  Physical Therapy Treatment  Patient Details  Name: Alexis Underwood MRN: [id number] Date of Birth: April 08, 1976 Referring Provider: Dr. Autumn Messing   Encounter Date: 07/23/2017  PT End of Session - 07/23/17 1658    Visit Number  5    Number of Visits  10    Date for PT Re-Evaluation  08/09/17    PT Start Time  5053    PT Stop Time  1603    PT Time Calculation (min)  48 min    Activity Tolerance  Patient tolerated treatment well    Behavior During Therapy  [facility] [facility] for tasks assessed/performed       Past Medical History:  Diagnosis Date  . Breast cancer ([place])   . Family history of breast cancer   . Family history of throat cancer   . History of kidney stones   . Hyperlipidemia   . SVD (spontaneous vaginal delivery)     Past Surgical History:  Procedure Laterality Date  . BREAST BIOPSY Left 04/18/2017   Malignant  . BREAST BIOPSY Right 06/11/2017  . BREAST RECONSTRUCTION WITH PLACEMENT OF TISSUE EXPANDER AND FLEX HD (ACELLULAR HYDRATED DERMIS) Left 06/20/2017   Procedure: LEFT BREAST RECONSTRUCTION WITH PLACEMENT OF TISSUE EXPANDER AND FLEX HD (ACELLULAR HYDRATED DERMIS);  Surgeon: Wallace Going, DO;  Location: [place];  Service: Plastics;  Laterality: Left;  . COLONOSCOPY    . MASTECTOMY W/ SENTINEL NODE BIOPSY Left 06/20/2017  . MASTECTOMY W/ SENTINEL NODE BIOPSY Left 06/20/2017   Procedure: LEFT MASTECTOMY WITH LEFT SENTINEL LYMPH NODE BIOPSY;  Surgeon: Jovita Kussmaul, MD;  Location: [place];  Service: General;  Laterality: Left;  . PORTACATH PLACEMENT Right 06/20/2017   Procedure: INSERTION PORT-A-CATH;  Surgeon: Jovita Kussmaul, MD;  Location: [place];  Service: General;  Laterality: Right;  . WISDOM TOOTH EXTRACTION      There were no vitals filed for this visit.  Subjective Assessment - 07/23/17 1516    Subjective  My  shoulder is good today. I feel like I am getting more ROM.     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 The area of calcs measured 9.3 cm and is triple positive with a Ki67 of 45%. Patient underwent a left mastectomy and sentinel node biopsy with tissue expander reconstruction on 06/21/17. She has no other medical problems.    Patient Stated Goals  Improve shoulder ROM and make sure she's doing ok    Currently in Pain?  No/denies    Pain Score  0-No pain                      [facility] Adult PT Treatment/Exercise - 07/23/17 0001      Shoulder Exercises: Supine   Horizontal ABduction  Strengthening;Both;10 reps;Theraband    Theraband Level (Shoulder Horizontal ABduction)  Level 1 (Yellow)    External Rotation  Strengthening;Both;10 reps;Theraband    Theraband Level (Shoulder External Rotation)  Level 1 (Yellow)    Flexion  Strengthening;Both;10 reps;Theraband narrow and wide grip    Theraband Level (Shoulder Flexion)  Level 1 (Yellow)    Other Supine Exercises  D2 x 10 reps bilaterally using yellow theraband      Shoulder Exercises: Pulleys   Flexion  2 minutes    ABduction  2 minutes  Shoulder Exercises: Therapy Ball   Flexion  10 reps With forward lean into end of stretch    ABduction  10 reps on L       Manual Therapy   Manual Therapy  Myofascial release;Passive ROM    Myofascial Release  in supine to L axilla while UE was in abduction also with downward stretch of trunk during abduction    Passive ROM  In Supine to Lt shoulder into flexion , er and IR, abduction and D2 al to pts tolerance.                   PT Long Term Goals - 07/12/17 1153      PT LONG TERM GOAL #1   Title  Patient will be independent with her home exercise program to promote shoulder ROM.    Time  4    Period  Weeks    Status  New      PT LONG TERM GOAL #2   Title  Increase left shoulder active flexion ROM  to >/= 140 degrees for increased ability to reach overhead.    Time  4    Period  Weeks    Status  New      PT LONG TERM GOAL #3   Title  Increase left shoulder active abduction ROM to >/= 140 degrees for increased ability to reach overhead.    Time  4    Period  Weeks    Status  New      PT LONG TERM GOAL #4   Title  Patient will report she is able to do her laundry and >/= 50% of normal household tasks.    Time  4    Period  Weeks    Status  New      PT LONG TERM GOAL #5   Title  Patient will verbalize understanding of risk reduction practices related to lymphedema.    Time  4    Period  [facility] Goals - 04/25/17 1654      Patient will be able to verbalize understanding of pertinent lymphedema risk reduction practices relevant to her diagnosis specifically related to skin care.   Time  1    Period  Days    Status  Achieved      Patient will be able to return demonstrate and/or verbalize understanding of the post-op home exercise program related to regaining shoulder range of motion.   Time  1    Period  Days    Status  Achieved      Patient will be able to verbalize understanding of the importance of attending the postoperative After Breast Cancer Class for further lymphedema risk reduction education and therapeutic exercise.   Time  1    Period  Days    Status  Achieved           Plan - 07/23/17 1659    Clinical Impression Statement  Continued focusing on increasing left shoulder ROM with AAROM exercises and PROM today. Added supine scapular exercises to further stretch LUE and increase strength of L scapula.     Rehab Potential  Excellent    Clinical Impairments Affecting Rehab Potential  None    PT Frequency  2x / week    PT Duration  4 weeks    PT Treatment/Interventions  ADLs/Self Care Home Management;Therapeutic exercise;Patient/family education;Passive range of motion;Scar mobilization;Manual techniques;Therapeutic  activities  PT Next Visit Plan  Cont ROM left shoulder - PROM and AAROM, start pulleys and ball up wall, assess indep with supine scapular series.    PT Home Exercise Plan  supine scapular exercises    Consulted and Agree with Plan of Care  Patient       Patient will benefit from skilled therapeutic intervention in order to improve the following deficits and impairments:  Decreased range of motion, Impaired UE functional use, Pain, Decreased knowledge of precautions, Postural dysfunction, Decreased strength, Decreased scar mobility  Visit Diagnosis: Abnormal posture  Stiffness of left shoulder, not elsewhere classified     Problem List Patient Active Problem List   Diagnosis Date Noted  . Breast cancer ([place]) 06/20/2017  . Genetic testing 05/21/2017  . Family history of breast cancer   . Family history of throat cancer   . Malignant neoplasm of upper-outer quadrant of left breast in female, estrogen receptor positive ([place]) 04/19/2017    Allyson Sabal [facility] 07/23/2017, 5:01 PM  [place], [place], [address] Phone: [phone]   Fax:  [phone]  Name: CLOTIEL TROOP MRN: [id number] Date of Birth: 08-Apr-1976  Manus Gunning, PT 07/23/17 5:01 PM

## 2017-07-23 NOTE — Assessment & Plan Note (Addendum)
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 Adjuvant TCH Perjeta followed by Herceptin and Perjeta maintenance for 1 year 2. followed by adjuvant antiestrogen therapy -------------------------------------------------------------------- Current treatment: Cycle 1 day 1 TCH Perjeta Antiemetics were reviewed Chemotherapy consent obtained Chemotherapy education completed Echocardiogram : EF 55-60% Closely monitoring for chemotherapy toxicities. Return to clinic in one week for toxicity check

## 2017-07-23 NOTE — Patient Instructions (Signed)

## 2017-07-24 ENCOUNTER — Inpatient Hospital Stay: Payer: BLUE CROSS/BLUE SHIELD | Attending: Hematology and Oncology

## 2017-07-24 ENCOUNTER — Inpatient Hospital Stay: Payer: BLUE CROSS/BLUE SHIELD

## 2017-07-24 ENCOUNTER — Inpatient Hospital Stay (HOSPITAL_BASED_OUTPATIENT_CLINIC_OR_DEPARTMENT_OTHER): Payer: BLUE CROSS/BLUE SHIELD | Admitting: Hematology and Oncology

## 2017-07-24 ENCOUNTER — Ambulatory Visit (HOSPITAL_BASED_OUTPATIENT_CLINIC_OR_DEPARTMENT_OTHER): Payer: BLUE CROSS/BLUE SHIELD | Admitting: Medical

## 2017-07-24 ENCOUNTER — Encounter: Payer: Self-pay | Admitting: *Deleted

## 2017-07-24 VITALS — BP 135/88 | HR 100 | Temp 98.6°F | Resp 19

## 2017-07-24 DIAGNOSIS — R21 Rash and other nonspecific skin eruption: Secondary | ICD-10-CM | POA: Diagnosis not present

## 2017-07-24 DIAGNOSIS — Z9012 Acquired absence of left breast and nipple: Secondary | ICD-10-CM | POA: Diagnosis not present

## 2017-07-24 DIAGNOSIS — D6959 Other secondary thrombocytopenia: Secondary | ICD-10-CM | POA: Insufficient documentation

## 2017-07-24 DIAGNOSIS — Z5111 Encounter for antineoplastic chemotherapy: Secondary | ICD-10-CM | POA: Insufficient documentation

## 2017-07-24 DIAGNOSIS — C50412 Malignant neoplasm of upper-outer quadrant of left female breast: Secondary | ICD-10-CM

## 2017-07-24 DIAGNOSIS — Z170 Estrogen receptor positive status [ER+]: Secondary | ICD-10-CM | POA: Insufficient documentation

## 2017-07-24 DIAGNOSIS — M2550 Pain in unspecified joint: Secondary | ICD-10-CM | POA: Insufficient documentation

## 2017-07-24 DIAGNOSIS — M898X9 Other specified disorders of bone, unspecified site: Secondary | ICD-10-CM | POA: Diagnosis not present

## 2017-07-24 DIAGNOSIS — L739 Follicular disorder, unspecified: Secondary | ICD-10-CM | POA: Insufficient documentation

## 2017-07-24 DIAGNOSIS — Z5112 Encounter for antineoplastic immunotherapy: Secondary | ICD-10-CM | POA: Insufficient documentation

## 2017-07-24 LAB — COMPREHENSIVE METABOLIC PANEL
ALT: 15 U/L (ref 0–55)
AST: 15 U/L (ref 5–34)
Albumin: 3.9 g/dL (ref 3.5–5.0)
Alkaline Phosphatase: 99 U/L (ref 40–150)
Anion gap: 12 — ABNORMAL HIGH (ref 3–11)
BILIRUBIN TOTAL: 0.3 mg/dL (ref 0.2–1.2)
BUN: 10 mg/dL (ref 7–26)
CO2: 22 mmol/L (ref 22–29)
CREATININE: 0.78 mg/dL (ref 0.60–1.10)
Calcium: 9 mg/dL (ref 8.4–10.4)
Chloride: 107 mmol/L (ref 98–109)
GFR calc Af Amer: >60 mL/min (ref >60)
Glucose, Bld: 174 mg/dL — ABNORMAL HIGH (ref 70–140)
POTASSIUM: 3.5 mmol/L (ref 3.3–4.7)
Sodium: 141 mmol/L (ref 136–145)
TOTAL PROTEIN: 7 g/dL (ref 6.4–8.3)

## 2017-07-24 LAB — CBC WITH DIFFERENTIAL/PLATELET
BASOS ABS: 0 10*3/uL (ref 0.0–0.1)
BASOS PCT: 0 %
EOS ABS: 0 10*3/uL (ref 0.0–0.5)
EOS PCT: 0 %
HCT: 39 % (ref 34.8–46.6)
Hemoglobin: 13.5 g/dL (ref 11.6–15.9)
Lymphocytes Relative: 18 %
Lymphs Abs: 1.2 10*3/uL (ref 0.9–3.3)
MCH: 33.3 pg (ref 25.1–34.0)
MCHC: 34.6 g/dL (ref 31.5–36.0)
MCV: 96.1 fL (ref 79.5–101.0)
Monocytes Absolute: 0.1 10*3/uL (ref 0.1–0.9)
Monocytes Relative: 2 %
Neutro Abs: 5.3 10*3/uL (ref 1.5–6.5)
Neutrophils Relative %: 80 %
PLATELETS: 299 10*3/uL (ref 145–400)
RBC: 4.06 MIL/uL (ref 3.70–5.45)
RDW: 12 % (ref 11.2–16.1)
WBC: 6.6 10*3/uL (ref 3.9–10.3)

## 2017-07-24 MED ORDER — SODIUM CHLORIDE 0.9 % IV SOLN
Freq: Once | INTRAVENOUS | Status: AC
  Administered 2017-07-24: 11:00:00 via INTRAVENOUS

## 2017-07-24 MED ORDER — ACETAMINOPHEN 325 MG PO TABS
650.0000 mg | ORAL_TABLET | Freq: Once | ORAL | Status: AC
  Administered 2017-07-24: 650 mg via ORAL

## 2017-07-24 MED ORDER — DIPHENHYDRAMINE HCL 25 MG PO CAPS
50.0000 mg | ORAL_CAPSULE | Freq: Once | ORAL | Status: AC
  Administered 2017-07-24: 50 mg via ORAL

## 2017-07-24 MED ORDER — DIPHENHYDRAMINE HCL 50 MG/ML IJ SOLN
25.0000 mg | Freq: Once | INTRAMUSCULAR | Status: AC | PRN
  Administered 2017-07-24: 25 mg via INTRAVENOUS

## 2017-07-24 MED ORDER — HEPARIN SOD (PORK) LOCK FLUSH 100 UNIT/ML IV SOLN
500.0000 [IU] | Freq: Once | INTRAVENOUS | Status: AC | PRN
  Administered 2017-07-24: 500 [IU]
  Filled 2017-07-24: qty 5

## 2017-07-24 MED ORDER — PERTUZUMAB CHEMO INJECTION 420 MG/14ML
840.0000 mg | Freq: Once | INTRAVENOUS | Status: AC
  Administered 2017-07-24: 840 mg via INTRAVENOUS
  Filled 2017-07-24: qty 28

## 2017-07-24 MED ORDER — SODIUM CHLORIDE 0.9% FLUSH
10.0000 mL | INTRAVENOUS | Status: DC | PRN
  Administered 2017-07-24: 10 mL
  Filled 2017-07-24: qty 10

## 2017-07-24 MED ORDER — METHYLPREDNISOLONE SODIUM SUCC 125 MG IJ SOLR
125.0000 mg | Freq: Once | INTRAMUSCULAR | Status: AC | PRN
  Administered 2017-07-24: 125 mg via INTRAVENOUS

## 2017-07-24 MED ORDER — TRASTUZUMAB CHEMO 150 MG IV SOLR
8.0000 mg/kg | Freq: Once | INTRAVENOUS | Status: AC
  Administered 2017-07-24: 504 mg via INTRAVENOUS
  Filled 2017-07-24: qty 24

## 2017-07-24 NOTE — Patient Instructions (Signed)
Implanted Port Home Guide An implanted port is a type of central line that is placed under the skin. Central lines are used to provide IV access when treatment or nutrition needs to be given through a person's veins. Implanted ports are used for long-term IV access. An implanted port may be placed because:  You need IV medicine that would be irritating to the small veins in your hands or arms.  You need long-term IV medicines, such as antibiotics.  You need IV nutrition for a long period.  You need frequent blood draws for lab tests.  You need dialysis.  Implanted ports are usually placed in the chest area, but they can also be placed in the upper arm, the abdomen, or the leg. An implanted port has two main parts:  Reservoir. The reservoir is round and will appear as a small, raised area under your skin. The reservoir is the part where a needle is inserted to give medicines or draw blood.  Catheter. The catheter is a thin, flexible tube that extends from the reservoir. The catheter is placed into a large vein. Medicine that is inserted into the reservoir goes into the catheter and then into the vein.  How will I care for my incision site? Do not get the incision site wet. Bathe or shower as directed by your health care provider. How is my port accessed? Special steps must be taken to access the port:  Before the port is accessed, a numbing cream can be placed on the skin. This helps numb the skin over the port site.  Your health care provider uses a sterile technique to access the port. ? Your health care provider must put on a mask and sterile gloves. ? The skin over your port is cleaned carefully with an antiseptic and allowed to dry. ? The port is gently pinched between sterile gloves, and a needle is inserted into the port.  Only "non-coring" port needles should be used to access the port. Once the port is accessed, a blood return should be checked. This helps ensure that the port  is in the vein and is not clogged.  If your port needs to remain accessed for a constant infusion, a clear (transparent) bandage will be placed over the needle site. The bandage and needle will need to be changed every week, or as directed by your health care provider.  Keep the bandage covering the needle clean and dry. Do not get it wet. Follow your health care provider's instructions on how to take a shower or bath while the port is accessed.  If your port does not need to stay accessed, no bandage is needed over the port.  What is flushing? Flushing helps keep the port from getting clogged. Follow your health care provider's instructions on how and when to flush the port. Ports are usually flushed with saline solution or a medicine called heparin. The need for flushing will depend on how the port is used.  If the port is used for intermittent medicines or blood draws, the port will need to be flushed: ? After medicines have been given. ? After blood has been drawn. ? As part of routine maintenance.  If a constant infusion is running, the port may not need to be flushed.  How long will my port stay implanted? The port can stay in for as long as your health care provider thinks it is needed. When it is time for the port to come out, surgery will be   done to remove it. The procedure is similar to the one performed when the port was put in. When should I seek immediate medical care? When you have an implanted port, you should seek immediate medical care if:  You notice a bad smell coming from the incision site.  You have swelling, redness, or drainage at the incision site.  You have more swelling or pain at the port site or the surrounding area.  You have a fever that is not controlled with medicine.  This information is not intended to replace advice given to you by your health care provider. Make sure you discuss any questions you have with your health care provider. Document  Released: 06/26/2005 Document Revised: 12/02/2015 Document Reviewed: 03/03/2013 Elsevier Interactive Patient Education  2017 [employer] Inc[employer]  

## 2017-07-24 NOTE — Progress Notes (Signed)
Sympt[facility] Management Clinic Progress Note   DEBAR PLATE [id number] 03-08-1976  42 y.o.  Alexis Underwood is managed by Dr. Nicholas Lose  Actively treated with chemotherapy: yes  Current Therapy: Carboplatin, docetaxel, pertuzumab, and trastuzumab  Last Treated: 07/24/2017  Assessment: Plan:    Adverse effect of chemotherapy, initial encounter  Alexis Underwood was seen in the infusion room for a suspected chemotherapy reaction. She was receiving trastuzumab at the time of her reaction. She had received a total of 10 minutes of her infusion prior to onset of symptoms. Her symptoms included: Headache, dizziness, and itching of her nose. She was premedicated with Tylenol and Benadryl prior to starting chemotherapy. Trastuzumab was paused and Alexis Underwood was given Solu-Medrol 125 mg IV and Benadryl 25 mg IV after onset of her symptoms. Alexis Underwood did  respond to intervention.  She was able to complete her infusion of trastuzumab.  Please see After Visit Summary for patient specific instructions.  Future Appointments  Date Time Provider [place]  07/25/2017  7:30 AM [facility] E17 [facility] None  07/27/2017 11:00 AM [place], Blaire L, PT [facility] None  07/30/2017 10:15 AM Collie Siad A, PTA [facility] None  07/31/2017  9:30 AM [facility] LAB 5 [facility] None  07/31/2017  9:45 AM [facility] J32 DNS [facility] None  07/31/2017 10:30 AM Nicholas Lose, MD [facility] None  08/01/2017 11:00 AM Suanne Marker, PTA [facility] None  08/06/2017 11:00 AM Suanne Marker, PTA [facility] None  08/08/2017 11:00 AM Suanne Marker, PTA [facility] None  08/14/2017  9:15 AM [facility] LAB 4 [facility] None  08/14/2017  9:30 AM [facility] FLUSH NURSE 2 [facility] None  08/14/2017 10:00 AM Nicholas Lose, MD [facility] None  08/14/2017 11:00 AM [facility] C10 [facility] None  09/04/2017  9:30 AM [facility] LAB 5 [facility] None  09/04/2017  9:45 AM  [facility] PROCEDURE 1 [facility] None  09/04/2017 10:15 AM Nicholas Lose, MD [facility] None  09/04/2017 11:15 AM [facility] B7 [facility] None  09/13/2017  2:00 PM [facility] ECHO 1-BUZZ [facility]  09/13/2017  3:00 PM Bensimhon, Shaune Pascal, MD [facility] None    No orders of the defined types were placed in this encounter.      Subjective:   Patient ID:  Alexis Underwood is a 42 y.o. (DOB 1976-04-05) female.  Chief Complaint: No chief complaint on file.   HPI Alexis Underwood was seen in the infusion room for a suspected chemotherapy reaction. She was receiving trastuzumab at the time of her reaction. She had received a total of 10 minutes of her infusion prior to onset of symptoms. Her symptoms included: Headache, dizziness, and itching of her nose. She was premedicated with Tylenol and Benadryl prior to starting chemotherapy. Trastuzumab was paused and Alexis Underwood was given Solu-Medrol 125 mg IV and Benadryl 25 mg IV after onset of her symptoms. Alexis Underwood did  respond to intervention.  She was able to complete her infusion of trastuzumab.  Medications: I have reviewed the patient's current medications.  Allergies: No Known Allergies  Past Medical History:  Diagnosis Date  . Breast cancer ([place])   . Family history of breast cancer   . Family history of throat cancer   . History of kidney stones   . Hyperlipidemia   . SVD (spontaneous vaginal delivery)     Past Surgical History:  Procedure Laterality Date  . BREAST BIOPSY Left 04/18/2017   Malignant  . BREAST BIOPSY Right 06/11/2017  .  BREAST RECONSTRUCTION WITH PLACEMENT OF TISSUE EXPANDER AND FLEX HD (ACELLULAR HYDRATED DERMIS) Left 06/20/2017   Procedure: LEFT BREAST RECONSTRUCTION WITH PLACEMENT OF TISSUE EXPANDER AND FLEX HD (ACELLULAR HYDRATED DERMIS);  Surgeon: Wallace Going, DO;  Location: [place];  Service: Plastics;  Laterality: Left;  . COLONOSCOPY    . MASTECTOMY W/ SENTINEL NODE BIOPSY Left 06/20/2017  .  MASTECTOMY W/ SENTINEL NODE BIOPSY Left 06/20/2017   Procedure: LEFT MASTECTOMY WITH LEFT SENTINEL LYMPH NODE BIOPSY;  Surgeon: Jovita Kussmaul, MD;  Location: [place];  Service: General;  Laterality: Left;  . PORTACATH PLACEMENT Right 06/20/2017   Procedure: INSERTION PORT-A-CATH;  Surgeon: Jovita Kussmaul, MD;  Location: [place];  Service: General;  Laterality: Right;  . WISDOM TOOTH EXTRACTION      Family History  Problem Relation Age of Onset  . Heart attack Mother   . Hyperlipidemia Mother   . Hypertension Mother   . Heart failure Mother   . Breast cancer Paternal Grandmother 20  . Heart disease Maternal Grandfather        died in 7's  . Throat cancer Paternal Grandfather 62    Social History   Socioeconomic History  . Marital status: Married    Spouse name: Not on file  . Number of children: Not on file  . Years of education: Not on file  . Highest education level: Not on file  Social Needs  . Financial resource strain: Not on file  . Food insecurity - worry: Not on file  . Food insecurity - inability: Not on file  . Transportation needs - medical: Not on file  . Transportation needs - non-medical: Not on file  Occupational History  . Not on file  Tobacco Use  . Smoking status: Never Smoker  . Smokeless tobacco: Never Used  Substance and Sexual Activity  . Alcohol use: No  . Drug use: No  . Sexual activity: Not on file  Other Topics Concern  . Not on file  Social History Narrative  . Not on file    Past Medical History, Surgical history, Social history, and Family history were reviewed and updated as appropriate.   Please see review of systems for further details on the patient's review from today.   Review of Systems:  Review of Systems  Constitutional: Negative for chills, diaphoresis and fever.  HENT: Negative for congestion and trouble swallowing.   Eyes: Negative for visual disturbance.  Respiratory: Negative for cough, choking, chest tightness,  shortness of breath, wheezing and stridor.   Cardiovascular: Negative for chest pain and palpitations.  Gastrointestinal: Negative for nausea and vomiting.  Skin:       Itching of her nose  Neurological: Positive for dizziness and headaches. Negative for speech difficulty and numbness.    Objective:   Physical Exam:  There were no vitals taken for this visit.   Physical Exam  Constitutional: No distress.  HENT:  Head: Normocephalic and atraumatic.  Cardiovascular: Normal rate, regular rhythm and normal heart sounds. Exam reveals no gallop and no friction rub.  No murmur heard. Pulmonary/Chest: Effort normal and breath sounds normal. No respiratory distress. She has no wheezes. She has no rales.  Neurological: She is alert.  Skin: Skin is warm and dry. No rash noted. She is not diaphoretic. No erythema.  Psychiatric: She has a normal mood and affect. Her behavior is normal. Judgment and thought content normal.    Lab Review:     Component Value Date/Time  NA 141 07/24/2017 0820   NA 140 04/25/2017 1219   K 3.5 07/24/2017 0820   K 3.7 04/25/2017 1219   CL 107 07/24/2017 0820   CO2 22 07/24/2017 0820   CO2 27 04/25/2017 1219   GLUCOSE 174 (H) 07/24/2017 0820   GLUCOSE 102 04/25/2017 1219   BUN 10 07/24/2017 0820   BUN 12.1 04/25/2017 1219   CREATININE 0.78 07/24/2017 0820   CREATININE 0.9 04/25/2017 1219   CALCIUM 9.0 07/24/2017 0820   CALCIUM 9.1 04/25/2017 1219   PROT 7.0 07/24/2017 0820   PROT 7.2 04/25/2017 1219   ALBUMIN 3.9 07/24/2017 0820   ALBUMIN 4.2 04/25/2017 1219   AST 15 07/24/2017 0820   AST 15 04/25/2017 1219   ALT 15 07/24/2017 0820   ALT 16 04/25/2017 1219   ALKPHOS 99 07/24/2017 0820   ALKPHOS 81 04/25/2017 1219   BILITOT 0.3 07/24/2017 0820   BILITOT 0.42 04/25/2017 1219   GFRNONAA >60 07/24/2017 0820   GFRAA >60 07/24/2017 0820       Component Value Date/Time   WBC 6.6 07/24/2017 0820   RBC 4.06 07/24/2017 0820   HGB 13.5 07/24/2017  0820   HGB 14.0 04/25/2017 1219   HCT 39.0 07/24/2017 0820   HCT 40.2 04/25/2017 1219   PLT 299 07/24/2017 0820   PLT 293 04/25/2017 1219   MCV 96.1 07/24/2017 0820   MCV 97.5 04/25/2017 1219   MCH 33.3 07/24/2017 0820   MCHC 34.6 07/24/2017 0820   RDW 12.0 07/24/2017 0820   RDW 12.4 04/25/2017 1219   LYMPHSABS 1.2 07/24/2017 0820   LYMPHSABS 2.3 04/25/2017 1219   MONOABS 0.1 07/24/2017 0820   MONOABS 0.9 04/25/2017 1219   EOSABS 0.0 07/24/2017 0820   EOSABS 0.1 04/25/2017 1219   BASOSABS 0.0 07/24/2017 0820   BASOSABS 0.0 04/25/2017 1219   -------------------------------  Imaging from last 24 hours (if applicable):  Radiology interpretation: No results found.

## 2017-07-24 NOTE — Progress Notes (Signed)
Pt will have her first time treatment split into two days due to scheduling issues.  Herceptin/Perjeta and tylenol/benadryl will be given 1/15.  Taxotere/Carboplatin and Neulasta with aloxi/emend/decadron will be given 1/16.  MD Lindi Adie ok with this plan.  Pt aware and ok with this plan as well.  Pt has had chemo education class.  Consent and all education about chemo & neulasta will be given 1/15.  Follow up call will need to be placed for both treatments during her visit tomorrow (1/16).  1225- Pt reports HA, dizziness, & itchy nose.  Infusion stopped, PA [employer].  NS wide open.  Denies CP or SOB or tightness. 1230- VS: 133/93, 101 HR, 100% 1231- HA and dizziness gone, itching still present, 25 ben IV given 1233- VS: 132/84, 102 HR, 100%.  Per PA Lucianne Lei will restart at same rate for Herceptin in 15 minutes. 1250- VS: 107/67, 96 HR, 100%.  PA Lucianne Lei revisited pt.  Pt denies any current symptoms.  Restarting herceptin.

## 2017-07-24 NOTE — Progress Notes (Signed)
Patient Care Team: Carol Ada, MD as PCP - General (Family Medicine)  DIAGNOSIS:  Encounter Diagnosis  Name Primary?  . Malignant neoplasm of upper-outer quadrant of left breast in female, estrogen receptor positive ([place])     SUMMARY OF ONCOLOGIC HISTORY:   Malignant neoplasm of upper-outer quadrant of left breast in female, estrogen receptor positive ([place])   04/16/2017 Initial Diagnosis    Screening mammogram detected calcifications left breast UOQ 9.3 cm, at 2:00: 1.4 cm and at 1:00: 8 mm; biopsy of mass at 1:00: IDC grade 2 with DCIS ER 95%, PR 90%,Ki-67 45%, HER-2 positive ratio 5.18; iopsy of the 2:00 mass and calcifications were DCIS with suspicion for microinvasion, T1 cN0 stage IA clinical stage AJCC 8      05/09/2017 Genetic Testing    The patient had genetic testing due to a personal and family history of breast cancer.  The Common Hereditary Cancer Panel was ordered.  The Hereditary Gene Panel offered by [facility] includes sequencing and/or deletion duplication testing of the following 47 genes: APC, ATM, AXIN2, BARD1, BMPR1A, BRCA1, BRCA2, BRIP1, CDH1, CDKN2A (p14ARF), CDKN2A (p16INK4a), CKD4, CHEK2, CTNNA1, DICER1, EPCAM (Deletion/duplication testing only), GREM1 (promoter region deletion/duplication testing only), KIT, MEN1, MLH1, MSH2, MSH3, MSH6, MUTYH, NBN, NF1, NHTL1, PALB2, PDGFRA, PMS2, POLD1, POLE, PTEN, RAD50, RAD51C, RAD51D, SDHB, SDHC, SDHD, SMAD4, SMARCA4. STK11, TP53, TSC1, TSC2, and VHL.  The following genes were evaluated for sequence changes only: SDHA and HOXB13 c.251G>A variant only.  Results- No pathogenic variants identified.  A Variant of Uncertain Significance was identified in the gene APC c.-[id number]>G (Promoter 1B).  The date of this test report is 05/09/2017.       06/20/2017 Surgery    Left mastectomy: Multifocal IDC with DCIS tumor size is 1.1, 1.6 and 2.4 cm, margins negative, 0/5 lymph nodes negative, ER 95%, PR 90%, HER-2 positive ratio 5.18,  Ki-67 45% T2N0 stage Ib pathologic stage      07/24/2017 -  Chemotherapy    TCH P followed by Herceptin and Perjeta maintenance        CHIEF COMPLIANT: Cycle 1 TCHP  INTERVAL HISTORY: Alexis Underwood is a 42 year old with above-mentioned history of left breast cancer who underwent mastectomy and had multifocal disease.  She is here to initiate chemotherapy with [place].  She is anxious to begin her treatment.  She has healed very well from a mastectomy.  REVIEW OF SYSTEMS:   Constitutional: Denies fevers, chills or abnormal weight loss Eyes: Denies blurriness of vision Ears, nose, mouth, throat, and face: Denies mucositis or sore throat Respiratory: Denies cough, dyspnea or wheezes Cardiovascular: Denies palpitation, chest discomfort Gastrointestinal:  Denies nausea, heartburn or change in bowel habits Skin: Denies abnormal skin rashes Lymphatics: Denies new lymphadenopathy or easy bruising Neurological:Denies numbness, tingling or new weaknesses Behavioral/Psych: Mood is stable, no new changes  Extremities: No lower extremity edema Breast: Left mastectomy All other systems were reviewed with the patient and are negative.  I have reviewed the past medical history, past surgical history, social history and family history with the patient and they are unchanged from previous note.  ALLERGIES:  has No Known Allergies.  MEDICATIONS:  Current Outpatient Medications  Medication Sig Dispense Refill  . acetaminophen (TYLENOL) 325 MG tablet Take 650 mg by mouth daily as needed for headache.    . dexamethasone (DECADRON) 4 MG tablet Take 1 tablet (4 mg total) by mouth daily. Start the day before Taxotere. Take once the day after chemo 12 tablet  0  . dextromethorphan-guaiFENesin (MUCINEX DM) 30-600 MG 12hr tablet Take 1 tablet by mouth 2 (two) times daily as needed for cough.    . lidocaine-prilocaine (EMLA) cream Apply 1 application topically daily as needed (port access).     Marland Kitchen  lidocaine-prilocaine (EMLA) cream Apply to affected area once 30 g 3  . LORazepam (ATIVAN) 0.5 MG tablet Take 1 tablet (0.5 mg total) by mouth at bedtime as needed for sleep. 30 tablet 0  . lovastatin (MEVACOR) 40 MG tablet Take 40 mg by mouth every evening.    . ondansetron (ZOFRAN) 8 MG tablet Take 1 tablet (8 mg total) by mouth 2 (two) times daily as needed (Nausea or vomiting). Begin 4 days after chemotherapy. 30 tablet 1  . prochlorperazine (COMPAZINE) 10 MG tablet Take 1 tablet (10 mg total) by mouth every 6 (six) hours as needed (Nausea or vomiting). 30 tablet 1  . tamoxifen (NOLVADEX) 20 MG tablet Take 1 tablet (20 mg total) by mouth daily. (Patient not taking: Reported on 06/14/2017) 30 tablet 0   No current facility-administered medications for this visit.     PHYSICAL EXAMINATION: ECOG PERFORMANCE STATUS: 1 - Symptomatic but completely ambulatory  Vitals:   07/24/17 0959  BP: 139/90  Pulse: (!) 102  Resp: 20  Temp: 98 F (36.7 C)  SpO2: 100%   Filed Weights   07/24/17 0959  Weight: 138 lb 14.4 oz (63 kg)    GENERAL:alert, no distress and comfortable SKIN: skin color, texture, turgor are normal, no rashes or significant lesions EYES: normal, Conjunctiva are pink and non-injected, sclera clear OROPHARYNX:no exudate, no erythema and lips, buccal mucosa, and tongue normal  NECK: supple, thyroid normal size, non-tender, without nodularity LYMPH:  no palpable lymphadenopathy in the cervical, axillary or inguinal LUNGS: clear to auscultation and percussion with normal breathing effort HEART: regular rate & rhythm and no murmurs and no lower extremity edema ABDOMEN:abdomen soft, non-tender and normal bowel sounds MUSCULOSKELETAL:no cyanosis of digits and no clubbing  NEURO: alert & oriented x 3 with fluent speech, no focal motor/sensory deficits EXTREMITIES: No lower extremity edema  LABORATORY DATA:  I have reviewed the data as listed CMP Latest Ref Rng & Units 06/13/2017  04/25/2017  Glucose 65 - 99 mg/dL 96 102  BUN 6 - 20 mg/dL 12 12.1  Creatinine 0.44 - 1.00 mg/dL 0.72 0.9  Sodium 135 - 145 mmol/L 139 140  Potassium 3.5 - 5.1 mmol/L 3.2(L) 3.7  Chloride 101 - 111 mmol/L 102 -  CO2 22 - 32 mmol/L 27 27  Calcium 8.9 - 10.3 mg/dL 8.6(L) 9.1  Total Protein 6.4 - 8.3 g/dL - 7.2  Total Bilirubin 0.20 - 1.20 mg/dL - 0.42  Alkaline Phos 40 - 150 U/L - 81  AST 5 - 34 U/L - 15  ALT 0 - 55 U/L - 16    Lab Results  Component Value Date   WBC 6.6 07/24/2017   HGB 13.5 07/24/2017   HCT 39.0 07/24/2017   MCV 96.1 07/24/2017   PLT 299 07/24/2017   NEUTROABS 5.3 07/24/2017    ASSESSMENT & PLAN:  Malignant neoplasm of upper-outer quadrant of left breast in female, estrogen receptor positive ([place]) 06/20/2017 left mastectomy: Multifocal IDC with DCIS tumor size is 1.1, 1.6 and 2.4 cm, margins negative, 0/5 lymph nodes negative,  ER 95%, PR 90%, HER-2 positive ratio 5.18, Ki-67 45%T2N0 stage Ib pathologic stage  Treatment plan: 1. Adjuvant TCH Perjeta followed by Herceptin and Perjeta maintenance for 1 year  2. followed by adjuvant antiestrogen therapy -------------------------------------------------------------------- Current treatment: Cycle 1 day 1 TCH Perjeta Antiemetics were reviewed Chemotherapy consent obtained Chemotherapy education completed by myself. I reviewed her entire treatment plan as well as the premedications and the supportive care treatments as well as her diet and activity recommendations.  Echocardiogram 06/14/2017: EF 55-60% Closely monitoring for chemotherapy toxicities. Return to clinic in one week for toxicity check  I spent 25 minutes talking to the patient of which more than half was spent in counseling and coordination of care.  No orders of the defined types were placed in this encounter.  The patient has a good understanding of the overall plan. she agrees with it. she will call with any problems that may develop before the  next visit here.   Harriette Ohara, MD 07/24/17

## 2017-07-24 NOTE — Patient Instructions (Signed)
[facility] Discharge Instructions for Patients Receiving Chemotherapy  Today you received the following chemotherapy agents: Herceptin & Perjeta  To help prevent nausea and vomiting after your treatment, we encourage you to take your nausea medication as prescribed.   If you develop nausea and vomiting that is not controlled by your nausea medication, call the clinic.   BELOW ARE SYMPTOMS THAT SHOULD BE REPORTED IMMEDIATELY:  *FEVER GREATER THAN 100.5 F  *CHILLS WITH OR WITHOUT FEVER  NAUSEA AND VOMITING THAT IS NOT CONTROLLED WITH YOUR NAUSEA MEDICATION  *UNUSUAL SHORTNESS OF BREATH  *UNUSUAL BRUISING OR BLEEDING  TENDERNESS IN MOUTH AND THROAT WITH OR WITHOUT PRESENCE OF ULCERS  *URINARY PROBLEMS  *BOWEL PROBLEMS  UNUSUAL RASH Items with * indicate a potential emergency and should be followed up as soon as possible.  Feel free to call the clinic should you have any questions or concerns. The clinic phone number is [phone].  Please show the [facility] at check-in to the Emergency Department and triage nurse.    Trastuzumab injection for infusion What is this medicine? TRASTUZUMAB (tras TOO zoo mab) is a monoclonal antibody. It is used to treat breast cancer and stomach cancer. This medicine may be used for other purposes; ask your health care provider or pharmacist if you have questions. COMMON BRAND NAME(S): Herceptin What should I tell my health care provider before I take this medicine? They need to know if you have any of these conditions: -heart disease -heart failure -lung or breathing disease, like asthma -an unusual or allergic reaction to trastuzumab, benzyl alcohol, or other medications, foods, dyes, or preservatives -pregnant or trying to get pregnant -breast-feeding How should I use this medicine? This drug is given as an infusion into a vein. It is administered in a hospital or clinic by a specially trained health care  professional. Talk to your pediatrician regarding the use of this medicine in children. This medicine is not approved for use in children. Overdosage: If you think you have taken too much of this medicine contact a poison control center or emergency room at once. NOTE: This medicine is only for you. Do not share this medicine with others. What if I miss a dose? It is important not to miss a dose. Call your doctor or health care professional if you are unable to keep an appointment. What may interact with this medicine? This medicine may interact with the following medications: -certain types of chemotherapy, such as daunorubicin, doxorubicin, epirubicin, and idarubicin This list may not describe all possible interactions. Give your health care provider a list of all the medicines, herbs, non-prescription drugs, or dietary supplements you use. Also tell them if you smoke, drink alcohol, or use illegal drugs. Some items may interact with your medicine. What should I watch for while using this medicine? Visit your doctor for checks on your progress. Report any side effects. Continue your course of treatment even though you feel ill unless your doctor tells you to stop. Call your doctor or health care professional for advice if you get a fever, chills or sore throat, or other symptoms of a cold or flu. Do not treat yourself. Try to avoid being around people who are sick. You may experience fever, chills and shaking during your first infusion. These effects are usually mild and can be treated with other medicines. Report any side effects during the infusion to your health care professional. Fever and chills usually do not happen with later infusions. Do  not become pregnant while taking this medicine or for 7 months after stopping it. Women should inform their doctor if they wish to become pregnant or think they might be pregnant. Women of child-bearing potential will need to have a negative pregnancy test  before starting this medicine. There is a potential for serious side effects to an unborn child. Talk to your health care professional or pharmacist for more information. Do not breast-feed an infant while taking this medicine or for 7 months after stopping it. Women must use effective birth control with this medicine. What side effects may I notice from receiving this medicine? Side effects that you should report to your doctor or health care professional as soon as possible: -allergic reactions like skin rash, itching or hives, swelling of the face, lips, or tongue -chest pain or palpitations -cough -dizziness -feeling faint or lightheaded, falls -fever -general ill feeling or flu-like symptoms -signs of worsening heart failure like breathing problems; swelling in your legs and feet -unusually weak or tired Side effects that usually do not require medical attention (report to your doctor or health care professional if they continue or are bothersome): -bone pain -changes in taste -diarrhea -joint pain -nausea/vomiting -weight loss This list may not describe all possible side effects. Call your doctor for medical advice about side effects. You may report side effects to FDA at 1-800-FDA-1088. Where should I keep my medicine? This drug is given in a hospital or clinic and will not be stored at home. NOTE: This sheet is a summary. It may not cover all possible information. If you have questions about this medicine, talk to your doctor, pharmacist, or health care provider.  2018 Else[employer] Standard (2016-06-20 14:37:52)   Pertuzumab injection What is this medicine? PERTUZUMAB (per TOOZ ue mab) is a monoclonal antibody. It is used to treat breast cancer. This medicine may be used for other purposes; ask your health care provider or pharmacist if you have questions. COMMON BRAND NAME(S): PERJETA What should I tell my health care provider before I take this medicine? They need to know  if you have any of these conditions: -heart disease -heart failure -high blood pressure -history of irregular heart beat -recent or ongoing radiation therapy -an unusual or allergic reaction to pertuzumab, other medicines, foods, dyes, or preservatives -pregnant or trying to get pregnant -breast-feeding How should I use this medicine? This medicine is for infusion into a vein. It is given by a health care professional in a hospital or clinic setting. Talk to your pediatrician regarding the use of this medicine in children. Special care may be needed. Overdosage: If you think you have taken too much of this medicine contact a poison control center or emergency room at once. NOTE: This medicine is only for you. Do not share this medicine with others. What if I miss a dose? It is important not to miss your dose. Call your doctor or health care professional if you are unable to keep an appointment. What may interact with this medicine? Interactions are not expected. Give your health care provider a list of all the medicines, herbs, non-prescription drugs, or dietary supplements you use. Also tell them if you smoke, drink alcohol, or use illegal drugs. Some items may interact with your medicine. This list may not describe all possible interactions. Give your health care provider a list of all the medicines, herbs, non-prescription drugs, or dietary supplements you use. Also tell them if you smoke, drink alcohol, or use illegal drugs. Some items  may interact with your medicine. What should I watch for while using this medicine? Your condition will be monitored carefully while you are receiving this medicine. Report any side effects. Continue your course of treatment even though you feel ill unless your doctor tells you to stop. Do not become pregnant while taking this medicine or for 7 months after stopping it. Women should inform their doctor if they wish to become pregnant or think they might be  pregnant. Women of child-bearing potential will need to have a negative pregnancy test before starting this medicine. There is a potential for serious side effects to an unborn child. Talk to your health care professional or pharmacist for more information. Do not breast-feed an infant while taking this medicine or for 7 months after stopping it. Women must use effective birth control with this medicine. Call your doctor or health care professional for advice if you get a fever, chills or sore throat, or other symptoms of a cold or flu. Do not treat yourself. Try to avoid being around people who are sick. You may experience fever, chills, and headache during the infusion. Report any side effects during the infusion to your health care professional. What side effects may I notice from receiving this medicine? Side effects that you should report to your doctor or health care professional as soon as possible: -breathing problems -chest pain or palpitations -dizziness -feeling faint or lightheaded -fever or chills -skin rash, itching or hives -sore throat -swelling of the face, lips, or tongue -swelling of the legs or ankles -unusually weak or tired Side effects that usually do not require medical attention (report to your doctor or health care professional if they continue or are bothersome): -diarrhea -hair loss -nausea, vomiting -tiredness This list may not describe all possible side effects. Call your doctor for medical advice about side effects. You may report side effects to FDA at 1-800-FDA-1088. Where should I keep my medicine? This drug is given in a hospital or clinic and will not be stored at home. NOTE: This sheet is a summary. It may not cover all possible information. If you have questions about this medicine, talk to your doctor, pharmacist, or health care provider.  2018 Elsevier/Gold Standard (2015-07-29 12:08:50)   Carboplatin injection What is this medicine? CARBOPLATIN  (KAR boe pla tin) is a chemotherapy drug. It targets fast dividing cells, like cancer cells, and causes these cells to die. This medicine is used to treat ovarian cancer and many other cancers. This medicine may be used for other purposes; ask your health care provider or pharmacist if you have questions. COMMON BRAND NAME(S): Paraplatin What should I tell my health care provider before I take this medicine? They need to know if you have any of these conditions: -blood disorders -hearing problems -kidney disease -recent or ongoing radiation therapy -an unusual or allergic reaction to carboplatin, cisplatin, other chemotherapy, other medicines, foods, dyes, or preservatives -pregnant or trying to get pregnant -breast-feeding How should I use this medicine? This drug is usually given as an infusion into a vein. It is administered in a hospital or clinic by a specially trained health care professional. Talk to your pediatrician regarding the use of this medicine in children. Special care may be needed. Overdosage: If you think you have taken too much of this medicine contact a poison control center or emergency room at once. NOTE: This medicine is only for you. Do not share this medicine with others. What if I miss a dose? It  is important not to miss a dose. Call your doctor or health care professional if you are unable to keep an appointment. What may interact with this medicine? -medicines for seizures -medicines to increase blood counts like filgrastim, pegfilgrastim, sargramostim -some antibiotics like amikacin, gentamicin, neomycin, streptomycin, tobramycin -vaccines Talk to your doctor or health care professional before taking any of these medicines: -acetaminophen -aspirin -ibuprofen -ketoprofen -naproxen This list may not describe all possible interactions. Give your health care provider a list of all the medicines, herbs, non-prescription drugs, or dietary supplements you use.  Also tell them if you smoke, drink alcohol, or use illegal drugs. Some items may interact with your medicine. What should I watch for while using this medicine? Your condition will be monitored carefully while you are receiving this medicine. You will need important blood work done while you are taking this medicine. This drug may make you feel generally unwell. This is not uncommon, as chemotherapy can affect healthy cells as well as cancer cells. Report any side effects. Continue your course of treatment even though you feel ill unless your doctor tells you to stop. In some cases, you may be given additional medicines to help with side effects. Follow all directions for their use. Call your doctor or health care professional for advice if you get a fever, chills or sore throat, or other symptoms of a cold or flu. Do not treat yourself. This drug decreases your body's ability to fight infections. Try to avoid being around people who are sick. This medicine may increase your risk to bruise or bleed. Call your doctor or health care professional if you notice any unusual bleeding. Be careful brushing and flossing your teeth or using a toothpick because you may get an infection or bleed more easily. If you have any dental work done, tell your dentist you are receiving this medicine. Avoid taking products that contain aspirin, acetaminophen, ibuprofen, naproxen, or ketoprofen unless instructed by your doctor. These medicines may hide a fever. Do not become pregnant while taking this medicine. Women should inform their doctor if they wish to become pregnant or think they might be pregnant. There is a potential for serious side effects to an unborn child. Talk to your health care professional or pharmacist for more information. Do not breast-feed an infant while taking this medicine. What side effects may I notice from receiving this medicine? Side effects that you should report to your doctor or health care  professional as soon as possible: -allergic reactions like skin rash, itching or hives, swelling of the face, lips, or tongue -signs of infection - fever or chills, cough, sore throat, pain or difficulty passing urine -signs of decreased platelets or bleeding - bruising, pinpoint red spots on the skin, black, tarry stools, nosebleeds -signs of decreased red blood cells - unusually weak or tired, fainting spells, lightheadedness -breathing problems -changes in hearing -changes in vision -chest pain -high blood pressure -low blood counts - This drug may decrease the number of white blood cells, red blood cells and platelets. You may be at increased risk for infections and bleeding. -nausea and vomiting -pain, swelling, redness or irritation at the injection site -pain, tingling, numbness in the hands or feet -problems with balance, talking, walking -trouble passing urine or change in the amount of urine Side effects that usually do not require medical attention (report to your doctor or health care professional if they continue or are bothersome): -hair loss -loss of appetite -metallic taste in the mouth or  changes in taste This list may not describe all possible side effects. Call your doctor for medical advice about side effects. You may report side effects to FDA at 1-800-FDA-1088. Where should I keep my medicine? This drug is given in a hospital or clinic and will not be stored at home. NOTE: This sheet is a summary. It may not cover all possible information. If you have questions about this medicine, talk to your doctor, pharmacist, or health care provider.  2018 Elsevier/Gold Standard (2007-10-01 14:38:05)   Docetaxel injection What is this medicine? DOCETAXEL (doe se TAX el) is a chemotherapy drug. It targets fast dividing cells, like cancer cells, and causes these cells to die. This medicine is used to treat many types of cancers like breast cancer, certain stomach cancers, head  and neck cancer, lung cancer, and prostate cancer. This medicine may be used for other purposes; ask your health care provider or pharmacist if you have questions. COMMON BRAND NAME(S): Docefrez, Taxotere What should I tell my health care provider before I take this medicine? They need to know if you have any of these conditions: -infection (especially a virus infection such as chickenpox, cold sores, or herpes) -liver disease -low blood counts, like low white cell, platelet, or red cell counts -an unusual or allergic reaction to docetaxel, polysorbate 80, other chemotherapy agents, other medicines, foods, dyes, or preservatives -pregnant or trying to get pregnant -breast-feeding How should I use this medicine? This drug is given as an infusion into a vein. It is administered in a hospital or clinic by a specially trained health care professional. Talk to your pediatrician regarding the use of this medicine in children. Special care may be needed. Overdosage: If you think you have taken too much of this medicine contact a poison control center or emergency room at once. NOTE: This medicine is only for you. Do not share this medicine with others. What if I miss a dose? It is important not to miss your dose. Call your doctor or health care professional if you are unable to keep an appointment. What may interact with this medicine? -cyclosporine -erythromycin -ketoconazole -medicines to increase blood counts like filgrastim, pegfilgrastim, sargramostim -vaccines Talk to your doctor or health care professional before taking any of these medicines: -acetaminophen -aspirin -ibuprofen -ketoprofen -naproxen This list may not describe all possible interactions. Give your health care provider a list of all the medicines, herbs, non-prescription drugs, or dietary supplements you use. Also tell them if you smoke, drink alcohol, or use illegal drugs. Some items may interact with your medicine. What  should I watch for while using this medicine? Your condition will be monitored carefully while you are receiving this medicine. You will need important blood work done while you are taking this medicine. This drug may make you feel generally unwell. This is not uncommon, as chemotherapy can affect healthy cells as well as cancer cells. Report any side effects. Continue your course of treatment even though you feel ill unless your doctor tells you to stop. In some cases, you may be given additional medicines to help with side effects. Follow all directions for their use. Call your doctor or health care professional for advice if you get a fever, chills or sore throat, or other symptoms of a cold or flu. Do not treat yourself. This drug decreases your body's ability to fight infections. Try to avoid being around people who are sick. This medicine may increase your risk to bruise or bleed. Call your doctor  or health care professional if you notice any unusual bleeding. This medicine may contain alcohol in the product. You may get drowsy or dizzy. Do not drive, use machinery, or do anything that needs mental alertness until you know how this medicine affects you. Do not stand or sit up quickly, especially if you are an older patient. This reduces the risk of dizzy or fainting spells. Avoid alcoholic drinks. Do not become pregnant while taking this medicine. Women should inform their doctor if they wish to become pregnant or think they might be pregnant. There is a potential for serious side effects to an unborn child. Talk to your health care professional or pharmacist for more information. Do not breast-feed an infant while taking this medicine. What side effects may I notice from receiving this medicine? Side effects that you should report to your doctor or health care professional as soon as possible: -allergic reactions like skin rash, itching or hives, swelling of the face, lips, or tongue -low blood  counts - This drug may decrease the number of white blood cells, red blood cells and platelets. You may be at increased risk for infections and bleeding. -signs of infection - fever or chills, cough, sore throat, pain or difficulty passing urine -signs of decreased platelets or bleeding - bruising, pinpoint red spots on the skin, black, tarry stools, nosebleeds -signs of decreased red blood cells - unusually weak or tired, fainting spells, lightheadedness -breathing problems -fast or irregular heartbeat -low blood pressure -mouth sores -nausea and vomiting -pain, swelling, redness or irritation at the injection site -pain, tingling, numbness in the hands or feet -swelling of the ankle, feet, hands -weight gain Side effects that usually do not require medical attention (report to your doctor or health care professional if they continue or are bothersome): -bone pain -complete hair loss including hair on your head, underarms, pubic hair, eyebrows, and eyelashes -diarrhea -excessive tearing -changes in the color of fingernails -loosening of the fingernails -nausea -muscle pain -red flush to skin -sweating -weak or tired This list may not describe all possible side effects. Call your doctor for medical advice about side effects. You may report side effects to FDA at 1-800-FDA-1088. Where should I keep my medicine? This drug is given in a hospital or clinic and will not be stored at home. NOTE: This sheet is a summary. It may not cover all possible information. If you have questions about this medicine, talk to your doctor, pharmacist, or health care provider.  2018 [employer]/Gold Standard (2015-07-29 12:32:56)

## 2017-07-25 ENCOUNTER — Inpatient Hospital Stay: Payer: BLUE CROSS/BLUE SHIELD

## 2017-07-25 ENCOUNTER — Telehealth: Payer: Self-pay | Admitting: Hematology and Oncology

## 2017-07-25 VITALS — BP 140/92 | HR 77 | Temp 98.5°F | Resp 17

## 2017-07-25 DIAGNOSIS — Z170 Estrogen receptor positive status [ER+]: Principal | ICD-10-CM

## 2017-07-25 DIAGNOSIS — C50412 Malignant neoplasm of upper-outer quadrant of left female breast: Secondary | ICD-10-CM | POA: Diagnosis not present

## 2017-07-25 MED ORDER — HEPARIN SOD (PORK) LOCK FLUSH 100 UNIT/ML IV SOLN
500.0000 [IU] | Freq: Once | INTRAVENOUS | Status: AC | PRN
  Administered 2017-07-25: 500 [IU]
  Filled 2017-07-25: qty 5

## 2017-07-25 MED ORDER — FOSAPREPITANT DIMEGLUMINE INJECTION 150 MG
Freq: Once | INTRAVENOUS | Status: AC
  Administered 2017-07-25: 08:00:00 via INTRAVENOUS
  Filled 2017-07-25: qty 5

## 2017-07-25 MED ORDER — SODIUM CHLORIDE 0.9% FLUSH
10.0000 mL | INTRAVENOUS | Status: DC | PRN
  Administered 2017-07-25: 10 mL
  Filled 2017-07-25: qty 10

## 2017-07-25 MED ORDER — PEGFILGRASTIM 6 MG/0.6ML ~~LOC~~ PSKT
6.0000 mg | PREFILLED_SYRINGE | Freq: Once | SUBCUTANEOUS | Status: AC
  Administered 2017-07-25: 6 mg via SUBCUTANEOUS

## 2017-07-25 MED ORDER — CARBOPLATIN CHEMO INJECTION 600 MG/60ML
700.0000 mg | Freq: Once | INTRAVENOUS | Status: AC
  Administered 2017-07-25: 700 mg via INTRAVENOUS
  Filled 2017-07-25: qty 70

## 2017-07-25 MED ORDER — DOCETAXEL CHEMO INJECTION 160 MG/16ML
75.0000 mg/m2 | Freq: Once | INTRAVENOUS | Status: AC
  Administered 2017-07-25: 120 mg via INTRAVENOUS
  Filled 2017-07-25: qty 12

## 2017-07-25 MED ORDER — SODIUM CHLORIDE 0.9 % IV SOLN
Freq: Once | INTRAVENOUS | Status: AC
  Administered 2017-07-25: 08:00:00 via INTRAVENOUS

## 2017-07-25 MED ORDER — PALONOSETRON HCL INJECTION 0.25 MG/5ML
0.2500 mg | Freq: Once | INTRAVENOUS | Status: AC
Start: 2017-07-25 — End: 2017-07-25
  Administered 2017-07-25: 0.25 mg via INTRAVENOUS

## 2017-07-25 NOTE — Telephone Encounter (Signed)
No 11/5 los.  °

## 2017-07-25 NOTE — Patient Instructions (Signed)
[facility] Discharge Instructions for Patients Receiving Chemotherapy  Today you received the following chemotherapy agents: Docetaxel (Taxotere) and Carboplatin (Paraplatin).   To help prevent nausea and vomiting after your treatment, we encourage you to take your nausea medication as prescribed. Received Aloxi during treatment-->take Compazine (not Zofran) for the next 3 days.  If you develop nausea and vomiting that is not controlled by your nausea medication, call the clinic.   BELOW ARE SYMPTOMS THAT SHOULD BE REPORTED IMMEDIATELY:  *FEVER GREATER THAN 100.5 F  *CHILLS WITH OR WITHOUT FEVER  NAUSEA AND VOMITING THAT IS NOT CONTROLLED WITH YOUR NAUSEA MEDICATION  *UNUSUAL SHORTNESS OF BREATH  *UNUSUAL BRUISING OR BLEEDING  TENDERNESS IN MOUTH AND THROAT WITH OR WITHOUT PRESENCE OF ULCERS  *URINARY PROBLEMS  *BOWEL PROBLEMS  UNUSUAL RASH Items with * indicate a potential emergency and should be followed up as soon as possible.  Feel free to call the clinic should you have any questions or concerns. The clinic phone number is [phone].  Please show the [facility] at check-in to the Emergency Department and triage nurse.  Docetaxel injection What is this medicine? DOCETAXEL (doe se TAX el) is a chemotherapy drug. It targets fast dividing cells, like cancer cells, and causes these cells to die. This medicine is used to treat many types of cancers like breast cancer, certain stomach cancers, head and neck cancer, lung cancer, and prostate cancer. This medicine may be used for other purposes; ask your health care provider or pharmacist if you have questions. COMMON BRAND NAME(S): Docefrez, Taxotere What should I tell my health care provider before I take this medicine? They need to know if you have any of these conditions: -infection (especially a virus infection such as chickenpox, cold sores, or herpes) -liver disease -low blood counts, like low  white cell, platelet, or red cell counts -an unusual or allergic reaction to docetaxel, polysorbate 80, other chemotherapy agents, other medicines, foods, dyes, or preservatives -pregnant or trying to get pregnant -breast-feeding How should I use this medicine? This drug is given as an infusion into a vein. It is administered in a hospital or clinic by a specially trained health care professional. Talk to your pediatrician regarding the use of this medicine in children. Special care may be needed. Overdosage: If you think you have taken too much of this medicine contact a poison control center or emergency room at once. NOTE: This medicine is only for you. Do not share this medicine with others. What if I miss a dose? It is important not to miss your dose. Call your doctor or health care professional if you are unable to keep an appointment. What may interact with this medicine? -cyclosporine -erythromycin -ketoconazole -medicines to increase blood counts like filgrastim, pegfilgrastim, sargramostim -vaccines Talk to your doctor or health care professional before taking any of these medicines: -acetaminophen -aspirin -ibuprofen -ketoprofen -naproxen This list may not describe all possible interactions. Give your health care provider a list of all the medicines, herbs, non-prescription drugs, or dietary supplements you use. Also tell them if you smoke, drink alcohol, or use illegal drugs. Some items may interact with your medicine. What should I watch for while using this medicine? Your condition will be monitored carefully while you are receiving this medicine. You will need important blood work done while you are taking this medicine. This drug may make you feel generally unwell. This is not uncommon, as chemotherapy can affect healthy cells as well as cancer  cells. Report any side effects. Continue your course of treatment even though you feel ill unless your doctor tells you to stop. In  some cases, you may be given additional medicines to help with side effects. Follow all directions for their use. Call your doctor or health care professional for advice if you get a fever, chills or sore throat, or other symptoms of a cold or flu. Do not treat yourself. This drug decreases your body's ability to fight infections. Try to avoid being around people who are sick. This medicine may increase your risk to bruise or bleed. Call your doctor or health care professional if you notice any unusual bleeding. This medicine may contain alcohol in the product. You may get drowsy or dizzy. Do not drive, use machinery, or do anything that needs mental alertness until you know how this medicine affects you. Do not stand or sit up quickly, especially if you are an older patient. This reduces the risk of dizzy or fainting spells. Avoid alcoholic drinks. Do not become pregnant while taking this medicine. Women should inform their doctor if they wish to become pregnant or think they might be pregnant. There is a potential for serious side effects to an unborn child. Talk to your health care professional or pharmacist for more information. Do not breast-feed an infant while taking this medicine. What side effects may I notice from receiving this medicine? Side effects that you should report to your doctor or health care professional as soon as possible: -allergic reactions like skin rash, itching or hives, swelling of the face, lips, or tongue -low blood counts - This drug may decrease the number of white blood cells, red blood cells and platelets. You may be at increased risk for infections and bleeding. -signs of infection - fever or chills, cough, sore throat, pain or difficulty passing urine -signs of decreased platelets or bleeding - bruising, pinpoint red spots on the skin, black, tarry stools, nosebleeds -signs of decreased red blood cells - unusually weak or tired, fainting spells,  lightheadedness -breathing problems -fast or irregular heartbeat -low blood pressure -mouth sores -nausea and vomiting -pain, swelling, redness or irritation at the injection site -pain, tingling, numbness in the hands or feet -swelling of the ankle, feet, hands -weight gain Side effects that usually do not require medical attention (report to your doctor or health care professional if they continue or are bothersome): -bone pain -complete hair loss including hair on your head, underarms, pubic hair, eyebrows, and eyelashes -diarrhea -excessive tearing -changes in the color of fingernails -loosening of the fingernails -nausea -muscle pain -red flush to skin -sweating -weak or tired This list may not describe all possible side effects. Call your doctor for medical advice about side effects. You may report side effects to FDA at 1-800-FDA-1088. Where should I keep my medicine? This drug is given in a hospital or clinic and will not be stored at home. NOTE: This sheet is a summary. It may not cover all possible information. If you have questions about this medicine, talk to your doctor, pharmacist, or health care provider.  2018 Elsevier/Gold Standard (2015-07-29 12:32:56)  Carboplatin injection What is this medicine? CARBOPLATIN (KAR boe pla tin) is a chemotherapy drug. It targets fast dividing cells, like cancer cells, and causes these cells to die. This medicine is used to treat ovarian cancer and many other cancers. This medicine may be used for other purposes; ask your health care provider or pharmacist if you have questions. COMMON BRAND NAME(S):  Paraplatin What should I tell my health care provider before I take this medicine? They need to know if you have any of these conditions: -blood disorders -hearing problems -kidney disease -recent or ongoing radiation therapy -an unusual or allergic reaction to carboplatin, cisplatin, other chemotherapy, other medicines, foods,  dyes, or preservatives -pregnant or trying to get pregnant -breast-feeding How should I use this medicine? This drug is usually given as an infusion into a vein. It is administered in a hospital or clinic by a specially trained health care professional. Talk to your pediatrician regarding the use of this medicine in children. Special care may be needed. Overdosage: If you think you have taken too much of this medicine contact a poison control center or emergency room at once. NOTE: This medicine is only for you. Do not share this medicine with others. What if I miss a dose? It is important not to miss a dose. Call your doctor or health care professional if you are unable to keep an appointment. What may interact with this medicine? -medicines for seizures -medicines to increase blood counts like filgrastim, pegfilgrastim, sargramostim -some antibiotics like amikacin, gentamicin, neomycin, streptomycin, tobramycin -vaccines Talk to your doctor or health care professional before taking any of these medicines: -acetaminophen -aspirin -ibuprofen -ketoprofen -naproxen This list may not describe all possible interactions. Give your health care provider a list of all the medicines, herbs, non-prescription drugs, or dietary supplements you use. Also tell them if you smoke, drink alcohol, or use illegal drugs. Some items may interact with your medicine. What should I watch for while using this medicine? Your condition will be monitored carefully while you are receiving this medicine. You will need important blood work done while you are taking this medicine. This drug may make you feel generally unwell. This is not uncommon, as chemotherapy can affect healthy cells as well as cancer cells. Report any side effects. Continue your course of treatment even though you feel ill unless your doctor tells you to stop. In some cases, you may be given additional medicines to help with side effects. Follow all  directions for their use. Call your doctor or health care professional for advice if you get a fever, chills or sore throat, or other symptoms of a cold or flu. Do not treat yourself. This drug decreases your body's ability to fight infections. Try to avoid being around people who are sick. This medicine may increase your risk to bruise or bleed. Call your doctor or health care professional if you notice any unusual bleeding. Be careful brushing and flossing your teeth or using a toothpick because you may get an infection or bleed more easily. If you have any dental work done, tell your dentist you are receiving this medicine. Avoid taking products that contain aspirin, acetaminophen, ibuprofen, naproxen, or ketoprofen unless instructed by your doctor. These medicines may hide a fever. Do not become pregnant while taking this medicine. Women should inform their doctor if they wish to become pregnant or think they might be pregnant. There is a potential for serious side effects to an unborn child. Talk to your health care professional or pharmacist for more information. Do not breast-feed an infant while taking this medicine. What side effects may I notice from receiving this medicine? Side effects that you should report to your doctor or health care professional as soon as possible: -allergic reactions like skin rash, itching or hives, swelling of the face, lips, or tongue -signs of infection - fever or chills,  cough, sore throat, pain or difficulty passing urine -signs of decreased platelets or bleeding - bruising, pinpoint red spots on the skin, black, tarry stools, nosebleeds -signs of decreased red blood cells - unusually weak or tired, fainting spells, lightheadedness -breathing problems -changes in hearing -changes in vision -chest pain -high blood pressure -low blood counts - This drug may decrease the number of white blood cells, red blood cells and platelets. You may be at increased risk  for infections and bleeding. -nausea and vomiting -pain, swelling, redness or irritation at the injection site -pain, tingling, numbness in the hands or feet -problems with balance, talking, walking -trouble passing urine or change in the amount of urine Side effects that usually do not require medical attention (report to your doctor or health care professional if they continue or are bothersome): -hair loss -loss of appetite -metallic taste in the mouth or changes in taste This list may not describe all possible side effects. Call your doctor for medical advice about side effects. You may report side effects to FDA at 1-800-FDA-1088. Where should I keep my medicine? This drug is given in a hospital or clinic and will not be stored at home. NOTE: This sheet is a summary. It may not cover all possible information. If you have questions about this medicine, talk to your doctor, pharmacist, or health care provider.  2018 Elsevier/Gold Standard (2007-10-01 14:38:05)

## 2017-07-26 ENCOUNTER — Telehealth: Payer: Self-pay

## 2017-07-26 NOTE — Telephone Encounter (Signed)
Left VM for chemo follow up call for pt. Expressed to call with any questions or concerns.  Cyndia Bent RN

## 2017-07-26 NOTE — Telephone Encounter (Signed)
-----   Message from Zola Button, RN sent at 07/25/2017 12:18 PM EST ----- Regarding: Dr. Lindi Adie; First time F/U call Pt received first time Taxotere and Carboplatin. Tolerated well. Thank you!!

## 2017-07-27 ENCOUNTER — Ambulatory Visit: Payer: BLUE CROSS/BLUE SHIELD | Admitting: Physical Therapy

## 2017-07-27 ENCOUNTER — Encounter: Payer: Self-pay | Admitting: Physical Therapy

## 2017-07-27 ENCOUNTER — Telehealth: Payer: Self-pay

## 2017-07-27 DIAGNOSIS — M25612 Stiffness of left shoulder, not elsewhere classified: Secondary | ICD-10-CM

## 2017-07-27 DIAGNOSIS — C50412 Malignant neoplasm of upper-outer quadrant of left female breast: Secondary | ICD-10-CM | POA: Diagnosis not present

## 2017-07-27 DIAGNOSIS — R293 Abnormal posture: Secondary | ICD-10-CM

## 2017-07-27 NOTE — Therapy (Signed)
[place], [place], [address] Phone: [phone]   Fax:  [phone]  Physical Therapy Treatment  Patient Details  Name: Alexis Underwood MRN: [id number] Date of Birth: 1976-05-09 Referring Provider: Dr. Autumn Messing   Encounter Date: 07/27/2017  PT End of Session - 07/27/17 1152    Visit Number  6    Number of Visits  10    Date for PT Re-Evaluation  08/09/17    PT Start Time  1107    PT Stop Time  1149    PT Time Calculation (min)  42 min    Activity Tolerance  Patient tolerated treatment well    Behavior During Therapy  [facility] for tasks assessed/performed       Past Medical History:  Diagnosis Date  . Breast cancer ([place])   . Family history of breast cancer   . Family history of throat cancer   . History of kidney stones   . Hyperlipidemia   . SVD (spontaneous vaginal delivery)     Past Surgical History:  Procedure Laterality Date  . BREAST BIOPSY Left 04/18/2017   Malignant  . BREAST BIOPSY Right 06/11/2017  . BREAST RECONSTRUCTION WITH PLACEMENT OF TISSUE EXPANDER AND FLEX HD (ACELLULAR HYDRATED DERMIS) Left 06/20/2017   Procedure: LEFT BREAST RECONSTRUCTION WITH PLACEMENT OF TISSUE EXPANDER AND FLEX HD (ACELLULAR HYDRATED DERMIS);  Surgeon: Wallace Going, DO;  Location: [facility];  Service: Plastics;  Laterality: Left;  . COLONOSCOPY    . MASTECTOMY W/ SENTINEL NODE BIOPSY Left 06/20/2017  . MASTECTOMY W/ SENTINEL NODE BIOPSY Left 06/20/2017   Procedure: LEFT MASTECTOMY WITH LEFT SENTINEL LYMPH NODE BIOPSY;  Surgeon: Jovita Kussmaul, MD;  Location: [facility];  Service: General;  Laterality: Left;  . PORTACATH PLACEMENT Right 06/20/2017   Procedure: INSERTION PORT-A-CATH;  Surgeon: Jovita Kussmaul, MD;  Location: [facility];  Service: General;  Laterality: Right;  . WISDOM TOOTH EXTRACTION      There were no vitals filed for this visit.  Subjective Assessment - 07/27/17 1111    Subjective  My  shoulder was a little sore but nothing unbareable. I had chemo on Monday and Tuesday and my equilibrium is a little off today.    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 The area of calcs measured 9.3 cm and is triple positive with a Ki67 of 45%. Patient underwent a left mastectomy and sentinel node biopsy with tissue expander reconstruction on 06/21/17. She has no other medical problems.    Patient Stated Goals  Improve shoulder ROM and make sure she's doing ok    Currently in Pain?  No/denies    Pain Score  0-No pain         [facility] PT Assessment - 07/27/17 0001      AROM   Left Shoulder Flexion  155 Degrees    Left Shoulder ABduction  176 Degrees                  [facility] Adult PT Treatment/Exercise - 07/27/17 0001      Shoulder Exercises: Supine   Horizontal ABduction  Strengthening;Both;10 reps;Theraband    Theraband Level (Shoulder Horizontal ABduction)  Level 1 (Yellow)    External Rotation  Strengthening;Both;10 reps;Theraband    Theraband Level (Shoulder External Rotation)  Level 1 (Yellow)    Flexion  Strengthening;Both;10 reps;Theraband narrow and wide grip    Theraband Level (Shoulder Flexion)  Level 1 (Yellow)    Other Supine Exercises  D2 x 10 reps bilaterally using yellow theraband      Shoulder Exercises: Pulleys   Flexion  2 minutes    ABduction  2 minutes      Shoulder Exercises: Therapy Ball   Flexion  10 reps With forward lean into end of stretch    ABduction  10 reps on L       Manual Therapy   Manual Therapy  Myofascial release;Passive ROM    Myofascial Release  in supine to L axilla while UE was in abduction also with downward stretch of trunk during abduction    Passive ROM  In Supine to Lt shoulder into flexion , er and IR, abduction and D2 all to pts tolerance.                   PT Long Term Goals - 07/27/17 1144      PT LONG TERM GOAL #1   Title  Patient  will be independent with her home exercise program to promote shoulder ROM.    Time  4    Period  Weeks    Status  On-going      PT LONG TERM GOAL #2   Title  Increase left shoulder active flexion ROM to >/= 140 degrees for increased ability to reach overhead.    Baseline  07/27/17- 155 degrees    Time  4    Period  Weeks    Status  Achieved      PT LONG TERM GOAL #3   Title  Increase left shoulder active abduction ROM to >/= 140 degrees for increased ability to reach overhead.    Baseline  07/27/17- 176    Time  4    Period  Weeks    Status  Achieved      PT LONG TERM GOAL #4   Title  Patient will report she is able to do her laundry and >/= 50% of normal household tasks.    Baseline  07/27/17- pt has not tried laundry today but is able to perform 75% of normal household tasks    Time  4    Period  Weeks    Status  On-going      PT LONG TERM GOAL #5   Title  Patient will verbalize understanding of risk reduction practices related to lymphedema.    Baseline  07/27/17- pt was able to verbalize some risk reduction practices    Time  4    Period  Weeks      Additional Long Term Goals   Additional Long Term Goals  Yes      PT LONG TERM GOAL #6   Title  Pt will demonstrate 165 degrees of left shoulder flexion to allow pt to reach overhead    Baseline  155    Time  2    Period  Weeks    Status  New    Target Date  08/09/17      Breast Clinic Goals - 04/25/17 1654      Patient will be able to verbalize understanding of pertinent lymphedema risk reduction practices relevant to her diagnosis specifically related to skin care.   Time  1    Period  Days    Status  Achieved      Patient will be able to return demonstrate and/or verbalize understanding of the post-op home exercise program related to regaining shoulder range of motion.  Time  1    Period  Days    Status  Achieved      Patient will be able to verbalize understanding of the importance of attending the  postoperative After Breast Cancer Class for further lymphedema risk reduction education and therapeutic exercise.   Time  1    Period  Days    Status  Achieved           Plan - 07/27/17 1153    Clinical Impression Statement  Assessed pt's progress towards goals in therapy. She has met her ROM goals and an additional shoulder flexion goal was added today. Continued with supine scapular exercises. Will focus on strenthening over the next few weeks and obtaining more left shoulder flexion.     Rehab Potential  Excellent    Clinical Impairments Affecting Rehab Potential  None    PT Frequency  2x / week    PT Duration  4 weeks    PT Treatment/Interventions  ADLs/Self Care Home Management;Therapeutic exercise;Patient/family education;Passive range of motion;Scar mobilization;Manual techniques;Therapeutic activities    PT Next Visit Plan  Cont ROM left shoulder - PROM and AAROM, add 3 way shoulder, give strength ABC    PT Home Exercise Plan  supine scapular exercises    Consulted and Agree with Plan of Care  Patient       Patient will benefit from skilled therapeutic intervention in order to improve the following deficits and impairments:  Decreased range of motion, Impaired UE functional use, Pain, Decreased knowledge of precautions, Postural dysfunction, Decreased strength, Decreased scar mobility  Visit Diagnosis: Abnormal posture  Stiffness of left shoulder, not elsewhere classified     Problem List Patient Active Problem List   Diagnosis Date Noted  . Breast cancer ([facility]) 06/20/2017  . Genetic testing 05/21/2017  . Family history of breast cancer   . Family history of throat cancer   . Malignant neoplasm of upper-outer quadrant of left breast in female, estrogen receptor positive ([place]) 04/19/2017    Allyson Sabal [facility] 07/27/2017, 11:56 AM  [facility], [place], [address] Phone: [phone]    Fax:  [phone]  Name: Alexis Underwood MRN: [id number] Date of Birth: February 03, 1976  Manus Gunning, PT 07/27/17 11:56 AM

## 2017-07-27 NOTE — Telephone Encounter (Signed)
Triage RN called and informed me pt called with questions. Attempted to call pt. Left VM.  Cyndia Bent RN

## 2017-07-30 ENCOUNTER — Telehealth: Payer: Self-pay

## 2017-07-30 NOTE — Assessment & Plan Note (Signed)
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 Adjuvant TCH Perjeta followed by Herceptin and Perjeta maintenance for 1 year 2. followed by adjuvant antiestrogen therapy -------------------------------------------------------------------- Current treatment: Cycle 1 day 8 TCH Perjeta Chemo Toxicities:  RTC in 2 weeks for cycle 2

## 2017-07-30 NOTE — Telephone Encounter (Signed)
TC to patient to return her call. Pt reported she has reddened area on her face and head and tender area on back of throat, hard to swallow when eating.  Per pt, no broken skin areas or fever and no chest pain or cough. She has tried Tums.  Advised to apply OTC Hydrocortisone as directed and can try Benadryl as directed at home, but not if she has to drive cause it can maker her sleepy. Pt reports some constipation but used Miralax and was able to have normal stool.  Also suggested Colace OTC daily as directed. Encouraged pt to call back if symptoms worsen ie fever, not able to take in fluids or rash changes or hydrocortisone/benadryl not helping. Pt voiced understanding and remarks she has appt with Dr Lindi Adie tomorrow and she will further address with him also.

## 2017-07-30 NOTE — Telephone Encounter (Signed)
TC to patient to return her VM regarding " think I'm having complications."  No answer, left her a VM with our contact information.

## 2017-07-31 ENCOUNTER — Inpatient Hospital Stay: Payer: BLUE CROSS/BLUE SHIELD | Admitting: Hematology and Oncology

## 2017-07-31 ENCOUNTER — Encounter: Payer: Self-pay | Admitting: *Deleted

## 2017-07-31 ENCOUNTER — Inpatient Hospital Stay: Payer: BLUE CROSS/BLUE SHIELD

## 2017-07-31 ENCOUNTER — Other Ambulatory Visit: Payer: BLUE CROSS/BLUE SHIELD

## 2017-07-31 DIAGNOSIS — M2550 Pain in unspecified joint: Secondary | ICD-10-CM

## 2017-07-31 DIAGNOSIS — R21 Rash and other nonspecific skin eruption: Secondary | ICD-10-CM

## 2017-07-31 DIAGNOSIS — Z170 Estrogen receptor positive status [ER+]: Secondary | ICD-10-CM | POA: Diagnosis not present

## 2017-07-31 DIAGNOSIS — Z95828 Presence of other vascular implants and grafts: Secondary | ICD-10-CM

## 2017-07-31 DIAGNOSIS — D6959 Other secondary thrombocytopenia: Secondary | ICD-10-CM | POA: Diagnosis not present

## 2017-07-31 DIAGNOSIS — M898X9 Other specified disorders of bone, unspecified site: Secondary | ICD-10-CM | POA: Diagnosis not present

## 2017-07-31 DIAGNOSIS — C50412 Malignant neoplasm of upper-outer quadrant of left female breast: Secondary | ICD-10-CM

## 2017-07-31 LAB — CBC WITH DIFFERENTIAL/PLATELET
Basophils Absolute: 0 10*3/uL (ref 0.0–0.1)
Basophils Relative: 0 %
EOS ABS: 0 10*3/uL (ref 0.0–0.5)
EOS PCT: 0 %
HCT: 39.9 % (ref 34.8–46.6)
Hemoglobin: 13.5 g/dL (ref 11.6–15.9)
LYMPHS ABS: 1.2 10*3/uL (ref 0.9–3.3)
Lymphocytes Relative: 36 %
MCH: 32.4 pg (ref 25.1–34.0)
MCHC: 33.9 g/dL (ref 31.5–36.0)
MCV: 95.6 fL (ref 79.5–101.0)
MONO ABS: 1 10*3/uL — AB (ref 0.1–0.9)
Monocytes Relative: 30 %
Neutro Abs: 1.1 10*3/uL — ABNORMAL LOW (ref 1.5–6.5)
Neutrophils Relative %: 34 %
PLATELETS: 63 10*3/uL — AB (ref 145–400)
RBC: 4.17 MIL/uL (ref 3.70–5.45)
RDW: 12 % (ref 11.2–16.1)
WBC: 3.3 10*3/uL — AB (ref 3.9–10.3)

## 2017-07-31 LAB — COMPREHENSIVE METABOLIC PANEL
ALBUMIN: 3.3 g/dL — AB (ref 3.5–5.0)
ALT: 18 U/L (ref 0–55)
AST: 14 U/L (ref 5–34)
Alkaline Phosphatase: 95 U/L (ref 40–150)
Anion gap: 12 — ABNORMAL HIGH (ref 3–11)
BILIRUBIN TOTAL: 0.5 mg/dL (ref 0.2–1.2)
BUN: 12 mg/dL (ref 7–26)
CHLORIDE: 98 mmol/L (ref 98–109)
CO2: 25 mmol/L (ref 22–29)
CREATININE: 0.84 mg/dL (ref 0.60–1.10)
Calcium: 8.9 mg/dL (ref 8.4–10.4)
GFR calc Af Amer: >60 mL/min (ref >60)
GLUCOSE: 144 mg/dL — AB (ref 70–140)
Potassium: 3.7 mmol/L (ref 3.3–4.7)
Sodium: 135 mmol/L — ABNORMAL LOW (ref 136–145)
Total Protein: 6.6 g/dL (ref 6.4–8.3)

## 2017-07-31 MED ORDER — SODIUM CHLORIDE 0.9% FLUSH
10.0000 mL | INTRAVENOUS | Status: DC | PRN
  Administered 2017-07-31: 10 mL via INTRAVENOUS
  Filled 2017-07-31: qty 10

## 2017-07-31 MED ORDER — CLINDAMYCIN PHOSPHATE 1 % EX GEL: Freq: Two times a day (BID) | CUTANEOUS | 0 refills | Status: DC

## 2017-07-31 MED ORDER — HEPARIN SOD (PORK) LOCK FLUSH 100 UNIT/ML IV SOLN
500.0000 [IU] | Freq: Once | INTRAVENOUS | Status: AC | PRN
  Administered 2017-07-31: 500 [IU] via INTRAVENOUS
  Filled 2017-07-31: qty 5

## 2017-07-31 MED ORDER — TRIAMCINOLONE ACETONIDE 0.5 % EX OINT: 1.0000 "application " | TOPICAL_OINTMENT | Freq: Two times a day (BID) | CUTANEOUS | 0 refills | Status: DC

## 2017-07-31 MED ORDER — PANTOPRAZOLE SODIUM 40 MG PO TBEC: 40.0000 mg | DELAYED_RELEASE_TABLET | Freq: Every day | ORAL | 3 refills | Status: DC

## 2017-07-31 NOTE — Progress Notes (Signed)
Patient Care Team: Carol Ada, MD as PCP - General (Family Medicine)  DIAGNOSIS:  Encounter Diagnosis  Name Primary?  . Malignant neoplasm of upper-outer quadrant of left breast in female, estrogen receptor positive ([place])     SUMMARY OF ONCOLOGIC HISTORY:   Malignant neoplasm of upper-outer quadrant of left breast in female, estrogen receptor positive ([place])   04/16/2017 Initial Diagnosis    Screening mammogram detected calcifications left breast UOQ 9.3 cm, at 2:00: 1.4 cm and at 1:00: 8 mm; biopsy of mass at 1:00: IDC grade 2 with DCIS ER 95%, PR 90%,Ki-67 45%, HER-2 positive ratio 5.18; iopsy of the 2:00 mass and calcifications were DCIS with suspicion for microinvasion, T1 cN0 stage IA clinical stage AJCC 8      05/09/2017 Genetic Testing    The patient had genetic testing due to a personal and family history of breast cancer.  The Common Hereditary Cancer Panel was ordered.  The Hereditary Gene Panel offered by [facility] includes sequencing and/or deletion duplication testing of the following 47 genes: APC, ATM, AXIN2, BARD1, BMPR1A, BRCA1, BRCA2, BRIP1, CDH1, CDKN2A (p14ARF), CDKN2A (p16INK4a), CKD4, CHEK2, CTNNA1, DICER1, EPCAM (Deletion/duplication testing only), GREM1 (promoter region deletion/duplication testing only), KIT, MEN1, MLH1, MSH2, MSH3, MSH6, MUTYH, NBN, NF1, NHTL1, PALB2, PDGFRA, PMS2, POLD1, POLE, PTEN, RAD50, RAD51C, RAD51D, SDHB, SDHC, SDHD, SMAD4, SMARCA4. STK11, TP53, TSC1, TSC2, and VHL.  The following genes were evaluated for sequence changes only: SDHA and HOXB13 c.251G>A variant only.  Results- No pathogenic variants identified.  A Variant of Uncertain Significance was identified in the gene APC c.-30626C>G (Promoter 1B).  The date of this test report is 05/09/2017.       06/20/2017 Surgery    Left mastectomy: Multifocal IDC with DCIS tumor size is 1.1, 1.6 and 2.4 cm, margins negative, 0/5 lymph nodes negative, ER 95%, PR 90%, HER-2 positive ratio 5.18,  Ki-67 45% T2N0 stage Ib pathologic stage      07/24/2017 -  Chemotherapy    TCH P followed by Herceptin and Perjeta maintenance        CHIEF COMPLIANT: Cycle 1 day 8 TCH Perjeta, profound rash on the face and chest  INTERVAL HISTORY: Alexis Underwood is a 42 year old with above-mentioned history left breast cancer treated with mastectomy and is currently on adjuvant chemotherapy with [place].  Today cycle 1 day 8..  She did have fatigue but denies any nausea vomiting.  She has profound maculopapular rash on the face and the chest.  This started 3-4 days after chemo and continued on.  She also complained of bone and joint pain which lasted a couple of days.  REVIEW OF SYSTEMS:   Constitutional: Denies fevers, chills or abnormal weight loss Eyes: Denies blurriness of vision Ears, nose, mouth, throat, and face: Denies mucositis or sore throat Respiratory: Denies cough, dyspnea or wheezes Cardiovascular: Denies palpitation, chest discomfort Gastrointestinal: Patient had constipation for a couple of days now the bowels are normal. Skin: Profound maculopapular rash on the face and chest Lymphatics: Denies new lymphadenopathy or easy bruising Neurological:Denies numbness, tingling or new weaknesses Behavioral/Psych: Mood is stable, no new changes  Extremities: No lower extremity edema  All other systems were reviewed with the patient and are negative.  I have reviewed the past medical history, past surgical history, social history and family history with the patient and they are unchanged from previous note.  ALLERGIES:  has No Known Allergies.  MEDICATIONS:  Current Outpatient Medications  Medication Sig Dispense Refill  .  acetaminophen (TYLENOL) 325 MG tablet Take 650 mg by mouth daily as needed for headache.    . clindamycin (CLINDAGEL) 1 % gel Apply topically 2 (two) times daily. 30 g 0  . dextromethorphan-guaiFENesin (MUCINEX DM) 30-600 MG 12hr tablet Take 1 tablet by mouth 2  (two) times daily as needed for cough.    . lidocaine-prilocaine (EMLA) cream Apply 1 application topically daily as needed (port access).     Marland Kitchen lidocaine-prilocaine (EMLA) cream Apply to affected area once 30 g 3  . LORazepam (ATIVAN) 0.5 MG tablet Take 1 tablet (0.5 mg total) by mouth at bedtime as needed for sleep. 30 tablet 0  . lovastatin (MEVACOR) 40 MG tablet Take 40 mg by mouth every evening.    . ondansetron (ZOFRAN) 8 MG tablet Take 1 tablet (8 mg total) by mouth 2 (two) times daily as needed (Nausea or vomiting). Begin 4 days after chemotherapy. 30 tablet 1  . pantoprazole (PROTONIX) 40 MG tablet Take 1 tablet (40 mg total) by mouth daily. 30 tablet 3  . prochlorperazine (COMPAZINE) 10 MG tablet Take 1 tablet (10 mg total) by mouth every 6 (six) hours as needed (Nausea or vomiting). 30 tablet 1  . tamoxifen (NOLVADEX) 20 MG tablet Take 1 tablet (20 mg total) by mouth daily. (Patient not taking: Reported on 06/14/2017) 30 tablet 0  . triamcinolone ointment (KENALOG) 0.5 % Apply 1 application topically 2 (two) times daily. 30 g 0   No current facility-administered medications for this visit.     PHYSICAL EXAMINATION: ECOG PERFORMANCE STATUS: 1 - Symptomatic but completely ambulatory  Vitals:   07/31/17 1058  BP: 118/80  Pulse: (!) 122  Resp: 18  Temp: 99.1 F (37.3 C)  SpO2: 99%   Filed Weights   07/31/17 1058  Weight: 135 lb 6.4 oz (61.4 kg)    GENERAL:alert, no distress and comfortable SKIN: Severe maculopapular rash on the face and chest EYES: normal, Conjunctiva are pink and non-injected, sclera clear OROPHARYNX:no exudate, no erythema and lips, buccal mucosa, and tongue normal  NECK: supple, thyroid normal size, non-tender, without nodularity LYMPH:  no palpable lymphadenopathy in the cervical, axillary or inguinal LUNGS: clear to auscultation and percussion with normal breathing effort HEART: regular rate & rhythm and no murmurs and no lower extremity  edema ABDOMEN:abdomen soft, non-tender and normal bowel sounds MUSCULOSKELETAL:no cyanosis of digits and no clubbing  NEURO: alert & oriented x 3 with fluent speech, no focal motor/sensory deficits EXTREMITIES: No lower extremity edema  LABORATORY DATA:  I have reviewed the data as listed CMP Latest Ref Rng & Units 07/31/2017 07/24/2017 06/13/2017  Glucose 70 - 140 mg/dL 144(H) 174(H) 96  BUN 7 - 26 mg/dL '12 10 12  '$ Creatinine 0.60 - 1.10 mg/dL 0.84 0.78 0.72  Sodium 136 - 145 mmol/L 135(L) 141 139  Potassium 3.3 - 4.7 mmol/L 3.7 3.5 3.2(L)  Chloride 98 - 109 mmol/L 98 107 102  CO2 22 - 29 mmol/L '25 22 27  '$ Calcium 8.4 - 10.4 mg/dL 8.9 9.0 8.6(L)  Total Protein 6.4 - 8.3 g/dL 6.6 7.0 -  Total Bilirubin 0.2 - 1.2 mg/dL 0.5 0.3 -  Alkaline Phos 40 - 150 U/L 95 99 -  AST 5 - 34 U/L 14 15 -  ALT 0 - 55 U/L 18 15 -    Lab Results  Component Value Date   WBC 3.3 (L) 07/31/2017   HGB 13.5 07/31/2017   HCT 39.9 07/31/2017   MCV 95.6 07/31/2017  PLT 63 (L) 07/31/2017   NEUTROABS 1.1 (L) 07/31/2017    ASSESSMENT & PLAN:  Malignant neoplasm of upper-outer quadrant of left breast in female, estrogen receptor positive ([place]) 06/20/2017 left mastectomy: Multifocal IDC with DCIS tumor size is 1.1, 1.6 and 2.4 cm, margins negative, 0/5 lymph nodes negative,  ER 95%, PR 90%, HER-2 positive ratio 5.18, Ki-67 45%T2N0 stage Ib pathologic stage  Treatment plan: 1. Adjuvant TCH Perjeta followed by Herceptin and Perjeta maintenance for 1 year 2. followed by adjuvant antiestrogen therapy -------------------------------------------------------------------- Current treatment: Cycle 1 day 8 TCH Perjeta Chemo Toxicities: 1. Severe maculopapular rash on the face and chest: I discussed with her about different1.  Medications that could have been responsible including Perjeta.  We will discontinue Perjeta with the next cycle.  I prescribed her topical clindamycin as well as topical triamcinolone. 2.  Neulasta related bone and joint pain 3.  Thrombocytopenia due to chemotherapy   RTC in 2 weeks for cycle 2  I spent 25 minutes talking to the patient of which more than half was spent in counseling and coordination of care.  No orders of the defined types were placed in this encounter.  The patient has a good understanding of the overall plan. she agrees with it. she will call with any problems that may develop before the next visit here.   Harriette Ohara, MD 07/31/17

## 2017-08-01 ENCOUNTER — Ambulatory Visit: Payer: BLUE CROSS/BLUE SHIELD

## 2017-08-01 DIAGNOSIS — C50412 Malignant neoplasm of upper-outer quadrant of left female breast: Secondary | ICD-10-CM | POA: Diagnosis not present

## 2017-08-01 DIAGNOSIS — Z483 Aftercare following surgery for neoplasm: Secondary | ICD-10-CM

## 2017-08-01 DIAGNOSIS — R293 Abnormal posture: Secondary | ICD-10-CM

## 2017-08-01 DIAGNOSIS — M25612 Stiffness of left shoulder, not elsewhere classified: Secondary | ICD-10-CM

## 2017-08-01 NOTE — Therapy (Signed)
[place], [place], [address] Phone: [phone]   Fax:  [phone]  Physical Therapy Treatment  Patient Details  Name: Alexis Underwood MRN: [id number] Date of Birth: 1976/01/03 Referring Provider: Dr. Autumn Messing   Encounter Date: 08/01/2017  PT End of Session - 08/01/17 1154    Visit Number  7    Number of Visits  10    Date for PT Re-Evaluation  08/09/17    PT Start Time  1102    PT Stop Time  1145    PT Time Calculation (min)  43 min    Activity Tolerance  Patient tolerated treatment well    Behavior During Therapy  [facility] for tasks assessed/performed       Past Medical History:  Diagnosis Date  . Breast cancer ([place])   . Family history of breast cancer   . Family history of throat cancer   . History of kidney stones   . Hyperlipidemia   . SVD (spontaneous vaginal delivery)     Past Surgical History:  Procedure Laterality Date  . BREAST BIOPSY Left 04/18/2017   Malignant  . BREAST BIOPSY Right 06/11/2017  . BREAST RECONSTRUCTION WITH PLACEMENT OF TISSUE EXPANDER AND FLEX HD (ACELLULAR HYDRATED DERMIS) Left 06/20/2017   Procedure: LEFT BREAST RECONSTRUCTION WITH PLACEMENT OF TISSUE EXPANDER AND FLEX HD (ACELLULAR HYDRATED DERMIS);  Surgeon: Wallace Going, DO;  Location: [place];  Service: Plastics;  Laterality: Left;  . COLONOSCOPY    . MASTECTOMY W/ SENTINEL NODE BIOPSY Left 06/20/2017  . MASTECTOMY W/ SENTINEL NODE BIOPSY Left 06/20/2017   Procedure: LEFT MASTECTOMY WITH LEFT SENTINEL LYMPH NODE BIOPSY;  Surgeon: Jovita Kussmaul, MD;  Location: [place];  Service: General;  Laterality: Left;  . PORTACATH PLACEMENT Right 06/20/2017   Procedure: INSERTION PORT-A-CATH;  Surgeon: Jovita Kussmaul, MD;  Location: [place];  Service: General;  Laterality: Right;  . WISDOM TOOTH EXTRACTION      There were no vitals filed for this visit.  Subjective Assessment - 08/01/17 1106    Subjective  I had  a really bad reaction to my first time chemo treatment. It started on Sunday, my face became beet red and I broke out in a rash all over my face.  Dr. Lindi Adie gave me 2 creams and it's already a lot better but he did decide to stop that chemo. My labs all look good though so I'm at least happy about that. I felt fine after last visit but didn't feel up to doing my HEP over the weekend.     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 The area of calcs measured 9.3 cm and is triple positive with a Ki67 of 45%. Patient underwent a left mastectomy and sentinel node biopsy with tissue expander reconstruction on 06/21/17. She has no other medical problems.    Patient Stated Goals  Improve shoulder ROM and make sure she's doing ok    Currently in Pain?  No/denies                      [facility] Adult PT Treatment/Exercise - 08/01/17 0001      Shoulder Exercises: Standing   Other Standing Exercises  Bil UE 3 way raises with 1 lb with back against wall/core engaged into flexion, scaption and abduction to shoulder height, 10 times each       Manual Therapy  Manual Therapy  Myofascial release;Passive ROM    Myofascial Release  in supine to Lt axilla while UE was in abduction also with downward stretch of trunk during abduction    Passive ROM  In Supine to Lt shoulder into flexion , er and IR, abduction and D2 all to pts tolerance.              PT Education - 08/01/17 1143    Education provided  Yes    Education Details  Standing bil UE 3 way raises with 1 lb    Person(s) Educated  Patient    Methods  Explanation;Demonstration;Handout    Comprehension  Verbalized understanding;Returned demonstration          PT Long Term Goals - 07/27/17 1144      PT LONG TERM GOAL #1   Title  Patient will be independent with her home exercise program to promote shoulder ROM.    Time  4    Period  Weeks    Status  On-going       PT LONG TERM GOAL #2   Title  Increase left shoulder active flexion ROM to >/= 140 degrees for increased ability to reach overhead.    Baseline  07/27/17- 155 degrees    Time  4    Period  Weeks    Status  Achieved      PT LONG TERM GOAL #3   Title  Increase left shoulder active abduction ROM to >/= 140 degrees for increased ability to reach overhead.    Baseline  07/27/17- 176    Time  4    Period  Weeks    Status  Achieved      PT LONG TERM GOAL #4   Title  Patient will report she is able to do her laundry and >/= 50% of normal household tasks.    Baseline  07/27/17- pt has not tried laundry today but is able to perform 75% of normal household tasks    Time  4    Period  Weeks    Status  On-going      PT LONG TERM GOAL #5   Title  Patient will verbalize understanding of risk reduction practices related to lymphedema.    Baseline  07/27/17- pt was able to verbalize some risk reduction practices    Time  4    Period  Weeks      Additional Long Term Goals   Additional Long Term Goals  Yes      PT LONG TERM GOAL #6   Title  Pt will demonstrate 165 degrees of left shoulder flexion to allow pt to reach overhead    Baseline  155    Time  2    Period  Weeks    Status  New    Target Date  08/09/17           Plan - 08/01/17 1155    Clinical Impression Statement  Pt had had a bad reaction to her chemo over weekend causing an outbreak/rash all over her face. She saw Dr. Lindi Adie Monday and he gave her 2 creams and decided not to cont with that chemo. Pt had not stretched since last session so focused on that today as shereported feeling tight in axilla. Did progress pts HEP to include 3 way raises which she tolerated very well. Pt reported feeling much better at end of session today.     Rehab Potential  Excellent    Clinical  Impairments Affecting Rehab Potential  None    PT Frequency  2x / week    PT Duration  4 weeks    PT Treatment/Interventions  ADLs/Self Care Home  Management;Therapeutic exercise;Patient/family education;Passive range of motion;Scar mobilization;Manual techniques;Therapeutic activities    PT Next Visit Plan  Cont ROM left shoulder - PROM and AAROM, review prn 3 way shoulder, give strength ABC    Consulted and Agree with Plan of Care  Patient       Patient will benefit from skilled therapeutic intervention in order to improve the following deficits and impairments:  Decreased range of motion, Impaired UE functional use, Pain, Decreased knowledge of precautions, Postural dysfunction, Decreased strength, Decreased scar mobility  Visit Diagnosis: Abnormal posture  Stiffness of left shoulder, not elsewhere classified  Aftercare following surgery for neoplasm     Problem List Patient Active Problem List   Diagnosis Date Noted  . Port-A-Cath in place 07/31/2017  . Breast cancer ([place]) 06/20/2017  . Genetic testing 05/21/2017  . Family history of breast cancer   . Family history of throat cancer   . Malignant neoplasm of upper-outer quadrant of left breast in female, estrogen receptor positive ([place]) 04/19/2017    Otelia Limes, PTA 08/01/2017, 12:08 PM  [place], [place], [address] Phone: [phone]   Fax:  [phone]  Name: Alexis Underwood MRN: [id number] Date of Birth: Aug 06, 1975

## 2017-08-01 NOTE — Patient Instructions (Signed)

## 2017-08-02 ENCOUNTER — Encounter: Payer: Self-pay | Admitting: Physical Therapy

## 2017-08-02 ENCOUNTER — Ambulatory Visit: Payer: BLUE CROSS/BLUE SHIELD | Admitting: Physical Therapy

## 2017-08-02 DIAGNOSIS — Z483 Aftercare following surgery for neoplasm: Secondary | ICD-10-CM

## 2017-08-02 DIAGNOSIS — C50412 Malignant neoplasm of upper-outer quadrant of left female breast: Secondary | ICD-10-CM | POA: Diagnosis not present

## 2017-08-02 DIAGNOSIS — R293 Abnormal posture: Secondary | ICD-10-CM

## 2017-08-02 DIAGNOSIS — M25612 Stiffness of left shoulder, not elsewhere classified: Secondary | ICD-10-CM

## 2017-08-02 NOTE — Therapy (Signed)
[place], [place], [address] Phone: [phone]   Fax:  [phone]  Physical Therapy Treatment  Patient Details  Name: Alexis Underwood MRN: [id number] Date of Birth: 1976-01-22 Referring Provider: Dr. Autumn Messing   Encounter Date: 08/02/2017  PT End of Session - 08/02/17 1700    Visit Number  8    Number of Visits  10    Date for PT Re-Evaluation  08/09/17    PT Start Time  1522    PT Stop Time  1603    PT Time Calculation (min)  41 min    Activity Tolerance  Patient tolerated treatment well    Behavior During Therapy  [facility] for tasks assessed/performed       Past Medical History:  Diagnosis Date  . Breast cancer ([place])   . Family history of breast cancer   . Family history of throat cancer   . History of kidney stones   . Hyperlipidemia   . SVD (spontaneous vaginal delivery)     Past Surgical History:  Procedure Laterality Date  . BREAST BIOPSY Left 04/18/2017   Malignant  . BREAST BIOPSY Right 06/11/2017  . BREAST RECONSTRUCTION WITH PLACEMENT OF TISSUE EXPANDER AND FLEX HD (ACELLULAR HYDRATED DERMIS) Left 06/20/2017   Procedure: LEFT BREAST RECONSTRUCTION WITH PLACEMENT OF TISSUE EXPANDER AND FLEX HD (ACELLULAR HYDRATED DERMIS);  Surgeon: Wallace Going, DO;  Location: [place];  Service: Plastics;  Laterality: Left;  . COLONOSCOPY    . MASTECTOMY W/ SENTINEL NODE BIOPSY Left 06/20/2017  . MASTECTOMY W/ SENTINEL NODE BIOPSY Left 06/20/2017   Procedure: LEFT MASTECTOMY WITH LEFT SENTINEL LYMPH NODE BIOPSY;  Surgeon: Jovita Kussmaul, MD;  Location: [place];  Service: General;  Laterality: Left;  . PORTACATH PLACEMENT Right 06/20/2017   Procedure: INSERTION PORT-A-CATH;  Surgeon: Jovita Kussmaul, MD;  Location: [place];  Service: General;  Laterality: Right;  . WISDOM TOOTH EXTRACTION      There were no vitals filed for this visit.  Subjective Assessment - 08/02/17 1525    Subjective  I am  fighting fatigue today.     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 The area of calcs measured 9.3 cm and is triple positive with a Ki67 of 45%. Patient underwent a left mastectomy and sentinel node biopsy with tissue expander reconstruction on 06/21/17. She has no other medical problems.    Patient Stated Goals  Improve shoulder ROM and make sure she's doing ok    Currently in Pain?  No/denies    Pain Score  0-No pain                      [facility] Adult PT Treatment/Exercise - 08/02/17 0001      Shoulder Exercises: Standing   Other Standing Exercises  Bil UE 3 way raises with 1 lb with back against wall/core engaged into flexion, scaption and abduction to shoulder height, 10 times each       Shoulder Exercises: Pulleys   Flexion  2 minutes    ABduction  2 minutes      Manual Therapy   Manual Therapy  Myofascial release;Passive ROM    Myofascial Release  in supine to Lt axilla while UE was in abduction also with downward stretch of trunk during abduction    Passive ROM  In Supine to Lt shoulder into flexion , er and IR,  abduction and D2 all to pts tolerance.              PT Education - 08/01/17 1143    Education provided  Yes    Education Details  Standing bil UE 3 way raises with 1 lb    Person(s) Educated  Patient    Methods  Explanation;Demonstration;Handout    Comprehension  Verbalized understanding;Returned demonstration          PT Long Term Goals - 07/27/17 1144      PT LONG TERM GOAL #1   Title  Patient will be independent with her home exercise program to promote shoulder ROM.    Time  4    Period  Weeks    Status  On-going      PT LONG TERM GOAL #2   Title  Increase left shoulder active flexion ROM to >/= 140 degrees for increased ability to reach overhead.    Baseline  07/27/17- 155 degrees    Time  4    Period  Weeks    Status  Achieved      PT LONG TERM GOAL #3    Title  Increase left shoulder active abduction ROM to >/= 140 degrees for increased ability to reach overhead.    Baseline  07/27/17- 176    Time  4    Period  Weeks    Status  Achieved      PT LONG TERM GOAL #4   Title  Patient will report she is able to do her laundry and >/= 50% of normal household tasks.    Baseline  07/27/17- pt has not tried laundry today but is able to perform 75% of normal household tasks    Time  4    Period  Weeks    Status  On-going      PT LONG TERM GOAL #5   Title  Patient will verbalize understanding of risk reduction practices related to lymphedema.    Baseline  07/27/17- pt was able to verbalize some risk reduction practices    Time  4    Period  Weeks      Additional Long Term Goals   Additional Long Term Goals  Yes      PT LONG TERM GOAL #6   Title  Pt will demonstrate 165 degrees of left shoulder flexion to allow pt to reach overhead    Baseline  155    Time  2    Period  Weeks    Status  New    Target Date  08/09/17      Breast Clinic Goals - 04/25/17 1654      Patient will be able to verbalize understanding of pertinent lymphedema risk reduction practices relevant to her diagnosis specifically related to skin care.   Time  1    Period  Days    Status  Achieved      Patient will be able to return demonstrate and/or verbalize understanding of the post-op home exercise program related to regaining shoulder range of motion.   Time  1    Period  Days    Status  Achieved      Patient will be able to verbalize understanding of the importance of attending the postoperative After Breast Cancer Class for further lymphedema risk reduction education and therapeutic exercise.   Time  1    Period  Days    Status  Achieved  Plan - 08/02/17 1701    Clinical Impression Statement  Pt was feeling more fatigued today. Re educated pt in 3 way shoulder raises. Continued with myofascial release and PROM this session due to increased  tightness. Pt responded well to this and achieved nearly full PROM by end of session.     Rehab Potential  Excellent    Clinical Impairments Affecting Rehab Potential  None    PT Frequency  2x / week    PT Duration  4 weeks    PT Treatment/Interventions  ADLs/Self Care Home Management;Therapeutic exercise;Patient/family education;Passive range of motion;Scar mobilization;Manual techniques;Therapeutic activities    PT Next Visit Plan  Cont ROM left shoulder - PROM and AAROM, review prn 3 way shoulder, give strength ABC    PT Home Exercise Plan  supine scapular exercises, 3 way shoulder    Consulted and Agree with Plan of Care  Patient       Patient will benefit from skilled therapeutic intervention in order to improve the following deficits and impairments:  Decreased range of motion, Impaired UE functional use, Pain, Decreased knowledge of precautions, Postural dysfunction, Decreased strength, Decreased scar mobility  Visit Diagnosis: Abnormal posture  Stiffness of left shoulder, not elsewhere classified  Aftercare following surgery for neoplasm     Problem List Patient Active Problem List   Diagnosis Date Noted  . Port-A-Cath in place 07/31/2017  . Breast cancer ([name]) 06/20/2017  . Genetic testing 05/21/2017  . Family history of breast cancer   . Family history of throat cancer   . Malignant neoplasm of upper-outer quadrant of left breast in female, estrogen receptor positive ([place]) 04/19/2017    Allyson Sabal [facility] 08/02/2017, [place], [place], [address] Phone: [phone]   Fax:  [phone]  Name: KASSEY LAFOREST MRN: [id number] Date of Birth: [birth year]  Manus Gunning, PT 08/02/17 5:02 PM

## 2017-08-06 ENCOUNTER — Telehealth: Payer: Self-pay

## 2017-08-06 ENCOUNTER — Ambulatory Visit: Payer: BLUE CROSS/BLUE SHIELD

## 2017-08-06 ENCOUNTER — Other Ambulatory Visit: Payer: Self-pay

## 2017-08-06 DIAGNOSIS — C50412 Malignant neoplasm of upper-outer quadrant of left female breast: Secondary | ICD-10-CM

## 2017-08-06 DIAGNOSIS — Z170 Estrogen receptor positive status [ER+]: Principal | ICD-10-CM

## 2017-08-06 NOTE — Telephone Encounter (Signed)
Left VM for pt regarding her call about a rash. Advised to return call.  Cyndia Bent RN

## 2017-08-06 NOTE — Progress Notes (Signed)
Lab orders in

## 2017-08-07 ENCOUNTER — Ambulatory Visit: Payer: BLUE CROSS/BLUE SHIELD

## 2017-08-07 ENCOUNTER — Inpatient Hospital Stay: Payer: BLUE CROSS/BLUE SHIELD

## 2017-08-07 ENCOUNTER — Inpatient Hospital Stay (HOSPITAL_BASED_OUTPATIENT_CLINIC_OR_DEPARTMENT_OTHER): Payer: BLUE CROSS/BLUE SHIELD | Admitting: Adult Health

## 2017-08-07 ENCOUNTER — Telehealth: Payer: Self-pay

## 2017-08-07 VITALS — BP 124/89 | HR 95 | Temp 98.5°F | Resp 18 | Ht 61.0 in | Wt 138.9 lb

## 2017-08-07 DIAGNOSIS — Z170 Estrogen receptor positive status [ER+]: Secondary | ICD-10-CM | POA: Diagnosis not present

## 2017-08-07 DIAGNOSIS — L739 Follicular disorder, unspecified: Secondary | ICD-10-CM | POA: Diagnosis not present

## 2017-08-07 DIAGNOSIS — R293 Abnormal posture: Secondary | ICD-10-CM

## 2017-08-07 DIAGNOSIS — M25612 Stiffness of left shoulder, not elsewhere classified: Secondary | ICD-10-CM

## 2017-08-07 DIAGNOSIS — C50412 Malignant neoplasm of upper-outer quadrant of left female breast: Secondary | ICD-10-CM

## 2017-08-07 DIAGNOSIS — Z483 Aftercare following surgery for neoplasm: Secondary | ICD-10-CM

## 2017-08-07 LAB — CMP (CANCER CENTER ONLY)
ALBUMIN: 3 g/dL — AB (ref 3.5–5.0)
ALK PHOS: 144 U/L (ref 40–150)
ALT: 29 U/L (ref 0–55)
AST: 15 U/L (ref 5–34)
Anion gap: 9 (ref 3–11)
BILIRUBIN TOTAL: 0.2 mg/dL (ref 0.2–1.2)
BUN: 14 mg/dL (ref 7–26)
CALCIUM: 8.5 mg/dL (ref 8.4–10.4)
CO2: 29 mmol/L (ref 22–29)
Chloride: 104 mmol/L (ref 98–109)
Creatinine: 0.82 mg/dL (ref 0.60–1.10)
GFR, Est AFR Am: >60 mL/min (ref >60)
GLUCOSE: 168 mg/dL — AB (ref 70–140)
POTASSIUM: 3.2 mmol/L — AB (ref 3.3–4.7)
Sodium: 142 mmol/L (ref 136–145)
Total Protein: 6.2 g/dL — ABNORMAL LOW (ref 6.4–8.3)

## 2017-08-07 LAB — CBC WITH DIFFERENTIAL (CANCER CENTER ONLY)
Basophils Absolute: 0.1 10*3/uL (ref 0.0–0.1)
Basophils Relative: 1 %
Eosinophils Absolute: 0 10*3/uL (ref 0.0–0.5)
Eosinophils Relative: 0 %
HCT: 32.1 % — ABNORMAL LOW (ref 34.8–46.6)
HEMOGLOBIN: 11.1 g/dL — AB (ref 11.6–15.9)
LYMPHS PCT: 16 %
Lymphs Abs: 1.6 10*3/uL (ref 0.9–3.3)
MCH: 33.1 pg (ref 25.1–34.0)
MCHC: 34.5 g/dL (ref 31.5–36.0)
MCV: 95.9 fL (ref 79.5–101.0)
MONO ABS: 0.5 10*3/uL (ref 0.1–0.9)
MONOS PCT: 6 %
NEUTROS PCT: 77 %
Neutro Abs: 7.5 10*3/uL — ABNORMAL HIGH (ref 1.5–6.5)
Platelet Count: 150 10*3/uL (ref 145–400)
RBC: 3.35 MIL/uL — ABNORMAL LOW (ref 3.70–5.45)
RDW: 11.6 % (ref 11.2–16.1)
WBC Count: 9.7 10*3/uL (ref 3.9–10.3)

## 2017-08-07 MED ORDER — DOXYCYCLINE HYCLATE 100 MG PO TABS: 100.0000 mg | ORAL_TABLET | Freq: Two times a day (BID) | ORAL | 0 refills | Status: DC

## 2017-08-07 NOTE — Patient Instructions (Signed)

## 2017-08-07 NOTE — Telephone Encounter (Signed)
-----   Message from Gardenia Phlegm, NP sent at 08/07/2017 10:17 AM EST -----  Patient potassium is mildly decreased.  I recommend she eat a potassium rich diet, and we will recheck next week.    Thanks,  LC ----- Message ----- From: Interface, Lab In [place] Sent: 08/07/2017   8:35 AM To: Nicholas Lose, MD

## 2017-08-07 NOTE — Therapy (Signed)
[place], [place], [address] Phone: [phone]   Fax:  [phone]  Physical Therapy Treatment  Patient Details  Name: Alexis Underwood MRN: [id number] Date of Birth: Aug 08, 1975 Referring Provider: Dr. Autumn Messing   Encounter Date: 08/07/2017  PT End of Session - 08/07/17 1157    Visit Number  9    Number of Visits  10    Date for PT Re-Evaluation  08/09/17    PT Start Time  1109    PT Stop Time  1149    PT Time Calculation (min)  40 min    Activity Tolerance  Patient tolerated treatment well    Behavior During Therapy  [facility] for tasks assessed/performed       Past Medical History:  Diagnosis Date  . Breast cancer ([place])   . Family history of breast cancer   . Family history of throat cancer   . History of kidney stones   . Hyperlipidemia   . SVD (spontaneous vaginal delivery)     Past Surgical History:  Procedure Laterality Date  . BREAST BIOPSY Left 04/18/2017   Malignant  . BREAST BIOPSY Right 06/11/2017  . BREAST RECONSTRUCTION WITH PLACEMENT OF TISSUE EXPANDER AND FLEX HD (ACELLULAR HYDRATED DERMIS) Left 06/20/2017   Procedure: LEFT BREAST RECONSTRUCTION WITH PLACEMENT OF TISSUE EXPANDER AND FLEX HD (ACELLULAR HYDRATED DERMIS);  Surgeon: Wallace Going, DO;  Location: [facility];  Service: Plastics;  Laterality: Left;  . COLONOSCOPY    . MASTECTOMY W/ SENTINEL NODE BIOPSY Left 06/20/2017  . MASTECTOMY W/ SENTINEL NODE BIOPSY Left 06/20/2017   Procedure: LEFT MASTECTOMY WITH LEFT SENTINEL LYMPH NODE BIOPSY;  Surgeon: Jovita Kussmaul, MD;  Location: [facility];  Service: General;  Laterality: Left;  . PORTACATH PLACEMENT Right 06/20/2017   Procedure: INSERTION PORT-A-CATH;  Surgeon: Jovita Kussmaul, MD;  Location: [facility];  Service: General;  Laterality: Right;  . WISDOM TOOTH EXTRACTION      There were no vitals filed for this visit.  Subjective Assessment - 08/07/17 1118    Subjective  I am  able to do everyting I need to around the house. My Lt shoulder is really doing alot better. I got a fill just a little while ago. The acne is my biggest side effect so far from chemo, my doctor said it's bc my hair is trying to fall out. The acne has worked it's way into my scalp.     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 The area of calcs measured 9.3 cm and is triple positive with a Ki67 of 45%. Patient underwent a left mastectomy and sentinel node biopsy with tissue expander reconstruction on 06/21/17. She has no other medical problems.    Patient Stated Goals  Improve shoulder ROM and make sure she's doing ok    Currently in Pain?  No/denies         [facility] PT Assessment - 08/07/17 0001      AROM   Left Shoulder Flexion  157 Degrees    Left Shoulder ABduction  180 Degrees    Left Shoulder Internal Rotation  90 Degrees                  [facility] Adult PT Treatment/Exercise - 08/07/17 0001      Shoulder Exercises: Pulleys   Flexion  2 minutes    ABduction  2 minutes  Shoulder Exercises: Therapy Ball   Flexion  10 reps With forward lean into end of stretch    ABduction  10 reps Lt UE with same side lean into end of stretch      Manual Therapy   Manual Therapy  Myofascial release;Passive ROM    Myofascial Release  in supine to Lt axilla while UE was in abduction also with downward stretch of trunk during abduction    Passive ROM  In Supine to Lt shoulder into flexion , er and IR, abduction and D2 all to pts tolerance.                   PT Long Term Goals - 07/27/17 1144      PT LONG TERM GOAL #1   Title  Patient will be independent with her home exercise program to promote shoulder ROM.    Time  4    Period  Weeks    Status  On-going      PT LONG TERM GOAL #2   Title  Increase left shoulder active flexion ROM to >/= 140 degrees for increased ability to reach overhead.    Baseline   07/27/17- 155 degrees    Time  4    Period  Weeks    Status  Achieved      PT LONG TERM GOAL #3   Title  Increase left shoulder active abduction ROM to >/= 140 degrees for increased ability to reach overhead.    Baseline  07/27/17- 176    Time  4    Period  Weeks    Status  Achieved      PT LONG TERM GOAL #4   Title  Patient will report she is able to do her laundry and >/= 50% of normal household tasks.    Baseline  07/27/17- pt has not tried laundry today but is able to perform 75% of normal household tasks    Time  4    Period  Weeks    Status  On-going      PT LONG TERM GOAL #5   Title  Patient will verbalize understanding of risk reduction practices related to lymphedema.    Baseline  07/27/17- pt was able to verbalize some risk reduction practices    Time  4    Period  Weeks      Additional Long Term Goals   Additional Long Term Goals  Yes      PT LONG TERM GOAL #6   Title  Pt will demonstrate 165 degrees of left shoulder flexion to allow pt to reach overhead    Baseline  155    Time  2    Period  Weeks    Status  New    Target Date  08/09/17      Breast Clinic Goals - 04/25/17 1654      Patient will be able to verbalize understanding of pertinent lymphedema risk reduction practices relevant to her diagnosis specifically related to skin care.   Time  1    Period  Days    Status  Achieved      Patient will be able to return demonstrate and/or verbalize understanding of the post-op home exercise program related to regaining shoulder range of motion.   Time  1    Period  Days    Status  Achieved      Patient will be able to verbalize understanding of the importance of attending the postoperative After Breast  Cancer Class for further lymphedema risk reduction education and therapeutic exercise.   Time  1    Period  Days    Status  Achieved           Plan - 08/07/17 1200    Clinical Impression Statement  Pt was feeling some better today though had reported  max fatigue yesterday and was unable to come for PT. Her A/ROM is progressing very well and she has met her goals, also reporting she is back to doing everything at home, laundry included. Continued with focus on end ROM stretching today. Pt should be ready for D/C at next visit tomorrow after instruction of Strength ABC Program.     Rehab Potential  Excellent    Clinical Impairments Affecting Rehab Potential  None    PT Frequency  2x / week    PT Duration  4 weeks    PT Treatment/Interventions  ADLs/Self Care Home Management;Therapeutic exercise;Patient/family education;Passive range of motion;Scar mobilization;Manual techniques;Therapeutic activities    PT Next Visit Plan  Instruct in Strength ABC Program and plan to D/C next visit if pt agrealble.     PT Home Exercise Plan  supine scapular exercises, 3 way shoulder    Consulted and Agree with Plan of Care  Patient       Patient will benefit from skilled therapeutic intervention in order to improve the following deficits and impairments:  Decreased range of motion, Impaired UE functional use, Pain, Decreased knowledge of precautions, Postural dysfunction, Decreased strength, Decreased scar mobility  Visit Diagnosis: Abnormal posture  Stiffness of left shoulder, not elsewhere classified  Aftercare following surgery for neoplasm     Problem List Patient Active Problem List   Diagnosis Date Noted  . Port-A-Cath in place 07/31/2017  . Breast cancer ([place]) 06/20/2017  . Genetic testing 05/21/2017  . Family history of breast cancer   . Family history of throat cancer   . Malignant neoplasm of upper-outer quadrant of left breast in female, estrogen receptor positive ([place]) 04/19/2017    Otelia Limes, PTA 08/07/2017, 12:04 PM  [place], [place], [address] Phone: [phone]   Fax:  [phone]  Name: Alexis Underwood MRN: [id number] Date of  Birth: [birth year]

## 2017-08-07 NOTE — Assessment & Plan Note (Signed)
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 Adjuvant TCH Perjeta followed by Herceptin and Perjeta maintenance for 1 year 2. followed by adjuvant antiestrogen therapy -------------------------------------------------------------------- Current treatment: Cycle 1 day 15 TCH Perjeta Chemo Toxicities: 1. Facial rash/folliculitis: Already on Clindagel, Kenalog ointment, will add doxycycline.  I also gave the patient a handout on folliculitis and treatment of such.  Reviewed care instructions including warm compresses BID.    Patient reviewed the above with Dr. Lindi Adie as well today.  He reviewed he may change her treatment next week.  She will return next week for labs, eval and treatment.

## 2017-08-07 NOTE — Telephone Encounter (Signed)
Patient vm set up with her name.  Left message with labs results and recommendations.  Asked patient to center with questions or concerns.

## 2017-08-07 NOTE — Progress Notes (Addendum)
[facility] Follow up:    Alexis Underwood, [place] [address]   DIAGNOSIS: Cancer Staging Malignant neoplasm of upper-outer quadrant of left breast in female, estrogen receptor positive ([place]) Staging form: Breast, AJCC 8th Edition - Clinical stage from 04/25/2017: Stage IA (cT1c, cN0, cM0, G2, ER: Positive, PR: Positive, HER2: Positive) - Unsigned   SUMMARY OF ONCOLOGIC HISTORY:   Malignant neoplasm of upper-outer quadrant of left breast in female, estrogen receptor positive ([place])   04/16/2017 Initial Diagnosis    Screening mammogram detected calcifications left breast UOQ 9.3 cm, at 2:00: 1.4 cm and at 1:00: 8 mm; biopsy of mass at 1:00: IDC grade 2 with DCIS ER 95%, PR 90%,Ki-67 45%, HER-2 positive ratio 5.18; iopsy of the 2:00 mass and calcifications were DCIS with suspicion for microinvasion, T1 cN0 stage IA clinical stage AJCC 8      05/09/2017 Genetic Testing    The patient had genetic testing due to a personal and family history of breast cancer.  The Common Hereditary Cancer Panel was ordered.  The Hereditary Gene Panel offered by [facility] includes sequencing and/or deletion duplication testing of the following 47 genes: APC, ATM, AXIN2, BARD1, BMPR1A, BRCA1, BRCA2, BRIP1, CDH1, CDKN2A (p14ARF), CDKN2A (p16INK4a), CKD4, CHEK2, CTNNA1, DICER1, EPCAM (Deletion/duplication testing only), GREM1 (promoter region deletion/duplication testing only), KIT, MEN1, MLH1, MSH2, MSH3, MSH6, MUTYH, NBN, NF1, NHTL1, PALB2, PDGFRA, PMS2, POLD1, POLE, PTEN, RAD50, RAD51C, RAD51D, SDHB, SDHC, SDHD, SMAD4, SMARCA4. STK11, TP53, TSC1, TSC2, and VHL.  The following genes were evaluated for sequence changes only: SDHA and HOXB13 c.251G>A variant only.  Results- No pathogenic variants identified.  A Variant of Uncertain Significance was identified in the gene APC c.-30626C>G (Promoter 1B).  The date of this test report is 05/09/2017.       06/20/2017  Surgery    Left mastectomy: Multifocal IDC with DCIS tumor size is 1.1, 1.6 and 2.4 cm, margins negative, 0/5 lymph nodes negative, ER 95%, PR 90%, HER-2 positive ratio 5.18, Ki-67 45% T2N0 stage Ib pathologic stage      07/24/2017 -  Chemotherapy    TCH P followed by Herceptin and Perjeta maintenance        CURRENT THERAPY: TCHP cycle 1 day 15  INTERVAL HISTORY: Alexis Underwood 42 y.o. female returns for evaluation of a worsening facial rash that started in her face that has now spread to her hair line.  She came in for this last week and she saw Dr. Lindi Adie who prescribed Clindagel and Kenalog ointment.  This helped her face and she says it has improved, however her scalp is worse, and it is now tender as well.  Her heart rate is elevated today, and she says that it is due to having her blood drawn in her arm when she thought it was going to be drawn from her port.  She did have some fatigue yesterday along with a  Low grade fever of 99.3 at its highest.     Patient Active Problem List   Diagnosis Date Noted  . Port-A-Cath in place 07/31/2017  . Breast cancer ([place]) 06/20/2017  . Genetic testing 05/21/2017  . Family history of breast cancer   . Family history of throat cancer   . Malignant neoplasm of upper-outer quadrant of left breast in female, estrogen receptor positive ([place]) 04/19/2017    has No Known Allergies.  MEDICAL HISTORY: Past Medical History:  Diagnosis Date  . Breast cancer ([place])   .  Family history of breast cancer   . Family history of throat cancer   . History of kidney stones   . Hyperlipidemia   . SVD (spontaneous vaginal delivery)     SURGICAL HISTORY: Past Surgical History:  Procedure Laterality Date  . BREAST BIOPSY Left 04/18/2017   Malignant  . BREAST BIOPSY Right 06/11/2017  . BREAST RECONSTRUCTION WITH PLACEMENT OF TISSUE EXPANDER AND FLEX HD (ACELLULAR HYDRATED DERMIS) Left 06/20/2017   Procedure: LEFT BREAST RECONSTRUCTION WITH PLACEMENT OF  TISSUE EXPANDER AND FLEX HD (ACELLULAR HYDRATED DERMIS);  Surgeon: Wallace Going, DO;  Location: [facility];  Service: Plastics;  Laterality: Left;  . COLONOSCOPY    . MASTECTOMY W/ SENTINEL NODE BIOPSY Left 06/20/2017  . MASTECTOMY W/ SENTINEL NODE BIOPSY Left 06/20/2017   Procedure: LEFT MASTECTOMY WITH LEFT SENTINEL LYMPH NODE BIOPSY;  Surgeon: Jovita Kussmaul, MD;  Location: [facility];  Service: General;  Laterality: Left;  . PORTACATH PLACEMENT Right 06/20/2017   Procedure: INSERTION PORT-A-CATH;  Surgeon: Jovita Kussmaul, MD;  Location: [facility];  Service: General;  Laterality: Right;  . WISDOM TOOTH EXTRACTION      SOCIAL HISTORY: Social History   Socioeconomic History  . Marital status: Married    Spouse name: Not on file  . Number of children: Not on file  . Years of education: Not on file  . Highest education level: Not on file  Social Needs  . Financial resource strain: Not on file  . Food insecurity - worry: Not on file  . Food insecurity - inability: Not on file  . Transportation needs - medical: Not on file  . Transportation needs - non-medical: Not on file  Occupational History  . Not on file  Tobacco Use  . Smoking status: Never Smoker  . Smokeless tobacco: Never Used  Substance and Sexual Activity  . Alcohol use: No  . Drug use: No  . Sexual activity: Not on file  Other Topics Concern  . Not on file  Social History Narrative  . Not on file    FAMILY HISTORY: Family History  Problem Relation Age of Onset  . Heart attack Mother   . Hyperlipidemia Mother   . Hypertension Mother   . Heart failure Mother   . Breast cancer Paternal Grandmother 69  . Heart disease Maternal Grandfather        died in 60's  . Throat cancer Paternal Grandfather 20    Review of Systems  Constitutional: Positive for fatigue and fever (low grade per interval history). Negative for appetite change, chills and diaphoresis.  HENT:   Negative for hearing loss, lump/mass, sore throat  and trouble swallowing.   Eyes: Negative for eye problems and icterus.  Respiratory: Negative for chest tightness, cough and shortness of breath.   Cardiovascular: Negative for chest pain, leg swelling and palpitations.  Gastrointestinal: Negative for abdominal distention, abdominal pain, constipation, diarrhea, nausea and vomiting.  Endocrine: Negative for hot flashes.  Genitourinary: Negative for difficulty urinating.   Musculoskeletal: Negative for arthralgias.  Skin: Positive for itching (see interval history) and rash (see interval history).  Neurological: Negative for dizziness, extremity weakness and headaches.  Hematological: Negative for adenopathy. Does not bruise/bleed easily.  Psychiatric/Behavioral: Negative for depression. The patient is not nervous/anxious.       PHYSICAL EXAMINATION  ECOG PERFORMANCE STATUS: 1 - Symptomatic but completely ambulatory  Vitals:   08/07/17 0907  BP: 124/89  Pulse: 95  Resp: 18  Temp: 98.5 F (36.9 C)  SpO2: 100%    Physical Exam  Constitutional: She is oriented to person, place, and time and well-developed, well-nourished, and in no distress.  HENT:  Head: Normocephalic and atraumatic.  Mouth/Throat: Oropharynx is clear and moist. No oropharyngeal exudate.  Eyes: Pupils are equal, round, and reactive to light. No scleral icterus.  Neck: Neck supple.  Cardiovascular: Normal rate, regular rhythm and normal heart sounds.  Pulmonary/Chest: Effort normal and breath sounds normal.  Abdominal: Soft. Bowel sounds are normal. She exhibits no distension and no mass. There is no tenderness. There is no rebound and no guarding.  Musculoskeletal: She exhibits no edema.  Lymphadenopathy:    She has no cervical adenopathy.  Neurological: She is alert and oriented to person, place, and time.  Skin: Skin is warm and dry. Rash (pustulrar lesions with erythema on face, chin, into scalp, and on posterior neck and lower hair line) noted.   Psychiatric: Mood and affect normal.    LABORATORY DATA:  CBC    Component Value Date/Time   WBC 9.7 08/07/2017 0814   WBC 3.3 (L) 07/31/2017 0941   RBC 3.35 (L) 08/07/2017 0814   HGB 13.5 07/31/2017 0941   HGB 14.0 04/25/2017 1219   HCT 32.1 (L) 08/07/2017 0814   HCT 40.2 04/25/2017 1219   PLT 150 08/07/2017 0814   PLT 293 04/25/2017 1219   MCV 95.9 08/07/2017 0814   MCV 97.5 04/25/2017 1219   MCH 33.1 08/07/2017 0814   MCHC 34.5 08/07/2017 0814   RDW 11.6 08/07/2017 0814   RDW 12.4 04/25/2017 1219   LYMPHSABS 1.6 08/07/2017 0814   LYMPHSABS 2.3 04/25/2017 1219   MONOABS 0.5 08/07/2017 0814   MONOABS 0.9 04/25/2017 1219   EOSABS 0.0 08/07/2017 0814   EOSABS 0.1 04/25/2017 1219   BASOSABS 0.1 08/07/2017 0814   BASOSABS 0.0 04/25/2017 1219    CMP     Component Value Date/Time   NA 142 08/07/2017 0814   NA 140 04/25/2017 1219   K 3.2 (L) 08/07/2017 0814   K 3.7 04/25/2017 1219   CL 104 08/07/2017 0814   CO2 29 08/07/2017 0814   CO2 27 04/25/2017 1219   GLUCOSE 168 (H) 08/07/2017 0814   GLUCOSE 102 04/25/2017 1219   BUN 14 08/07/2017 0814   BUN 12.1 04/25/2017 1219   CREATININE 0.84 07/31/2017 0941   CREATININE 0.9 04/25/2017 1219   CALCIUM 8.5 08/07/2017 0814   CALCIUM 9.1 04/25/2017 1219   PROT 6.2 (L) 08/07/2017 0814   PROT 7.2 04/25/2017 1219   ALBUMIN 3.0 (L) 08/07/2017 0814   ALBUMIN 4.2 04/25/2017 1219   AST 15 08/07/2017 0814   AST 15 04/25/2017 1219   ALT 29 08/07/2017 0814   ALT 16 04/25/2017 1219   ALKPHOS 144 08/07/2017 0814   ALKPHOS 81 04/25/2017 1219   BILITOT 0.2 08/07/2017 0814   BILITOT 0.42 04/25/2017 1219   GFRNONAA >60 08/07/2017 0814   GFRAA >60 08/07/2017 0814         ASSESSMENT and PLAN:   Malignant neoplasm of upper-outer quadrant of left breast in female, estrogen receptor positive ([place]) 06/20/2017 left mastectomy: Multifocal IDC with DCIS tumor size is 1.1, 1.6 and 2.4 cm, margins negative, 0/5 lymph nodes negative,   ER 95%, PR 90%, HER-2 positive ratio 5.18, Ki-67 45%T2N0 stage Ib pathologic stage  Treatment plan: 1. Adjuvant TCH Perjeta followed by Herceptin and Perjeta maintenance for 1 year 2. followed by adjuvant antiestrogen therapy -------------------------------------------------------------------- Current treatment: Cycle 1 day 15 TCH Perjeta  Chemo Toxicities: 1. Facial rash/folliculitis: Already on Clindagel, Kenalog ointment, will add doxycycline.  I also gave the patient a handout on folliculitis and treatment of such.  Reviewed care instructions including warm compresses BID.    Patient reviewed the above with Dr. Lindi Adie as well today.  He reviewed he may change her treatment next week.  She will return next week for labs, eval and treatment.       All questions were answered. The patient knows to call the clinic with any problems, questions or concerns. We can certainly see the patient much sooner if necessary.  A total of (30) minutes of face-to-face time was spent with this patient with greater than 50% of that time in counseling and care-coordination.  This note was electronically signed. Scot Dock, NP 08/07/2017   Attending Note  I personally saw and examined Alexis Underwood. The plan of care was discussed with her. I agree with the assessment and plan as documented above. Maculopapular rash on the face neck and back slightly better compared to last week with topical clindamycin ointment.  I agree that we would now need to escalate her treatment to oral doxycycline to help improve this rash. I discussed with the patient that rash could most likely related to be related to Taxotere.  We will discontinue Taxotere with her next chemotherapy next week.  I will see her on her day of treatment and finalize the plan.  Signed Harriette Ohara, MD

## 2017-08-08 ENCOUNTER — Ambulatory Visit: Payer: BLUE CROSS/BLUE SHIELD

## 2017-08-13 ENCOUNTER — Ambulatory Visit: Payer: BLUE CROSS/BLUE SHIELD | Attending: Plastic Surgery

## 2017-08-13 DIAGNOSIS — R293 Abnormal posture: Secondary | ICD-10-CM | POA: Diagnosis present

## 2017-08-13 DIAGNOSIS — M25612 Stiffness of left shoulder, not elsewhere classified: Secondary | ICD-10-CM | POA: Diagnosis present

## 2017-08-13 DIAGNOSIS — Z483 Aftercare following surgery for neoplasm: Secondary | ICD-10-CM | POA: Diagnosis present

## 2017-08-13 NOTE — Therapy (Signed)
[place], [place], [address] Phone: [phone]   Fax:  [phone]  Physical Therapy Treatment  Patient Details  Name: Alexis Underwood MRN: [id number] Date of Birth: 20-Sep-1975 Referring Provider: Dr. Autumn Messing   Encounter Date: 08/13/2017  PT End of Session - 08/13/17 0915    Visit Number  10    Number of Visits  10    Date for PT Re-Evaluation  08/09/17    PT Start Time  0850    PT Stop Time  0934    PT Time Calculation (min)  44 min    Activity Tolerance  Patient tolerated treatment well    Behavior During Therapy  [facility] for tasks assessed/performed       Past Medical History:  Diagnosis Date  . Breast cancer ([place])   . Family history of breast cancer   . Family history of throat cancer   . History of kidney stones   . Hyperlipidemia   . SVD (spontaneous vaginal delivery)     Past Surgical History:  Procedure Laterality Date  . BREAST BIOPSY Left 04/18/2017   Malignant  . BREAST BIOPSY Right 06/11/2017  . BREAST RECONSTRUCTION WITH PLACEMENT OF TISSUE EXPANDER AND FLEX HD (ACELLULAR HYDRATED DERMIS) Left 06/20/2017   Procedure: LEFT BREAST RECONSTRUCTION WITH PLACEMENT OF TISSUE EXPANDER AND FLEX HD (ACELLULAR HYDRATED DERMIS);  Surgeon: Wallace Going, DO;  Location: [facility];  Service: Plastics;  Laterality: Left;  . COLONOSCOPY    . MASTECTOMY W/ SENTINEL NODE BIOPSY Left 06/20/2017  . MASTECTOMY W/ SENTINEL NODE BIOPSY Left 06/20/2017   Procedure: LEFT MASTECTOMY WITH LEFT SENTINEL LYMPH NODE BIOPSY;  Surgeon: Jovita Kussmaul, MD;  Location: [facility] [place];  Service: General;  Laterality: Left;  . PORTACATH PLACEMENT Right 06/20/2017   Procedure: INSERTION PORT-A-CATH;  Surgeon: Jovita Kussmaul, MD;  Location: [facility];  Service: General;  Laterality: Right;  . WISDOM TOOTH EXTRACTION      There were no vitals filed for this visit.  Subjective Assessment - 08/13/17 0852    Subjective  I am  doing good overall and I'm think I'm ready to D/C.     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 The area of calcs measured 9.3 cm and is triple positive with a Ki67 of 45%. Patient underwent a left mastectomy and sentinel node biopsy with tissue expander reconstruction on 06/21/17. She has no other medical problems.    Patient Stated Goals  Improve shoulder ROM and make sure she's doing ok    Currently in Pain?  No/denies                      [facility] Adult PT Treatment/Exercise - 08/13/17 0001      Shoulder Exercises: Standing   Other Standing Exercises  Instructed pt in Strength ABC Program performing all stretches1 time each and with 15 sec holds; then 10 times of all strenghtening except 3 each side of Superwoman as this was very challenging for pt.              PT Education - 08/13/17 0915    Education provided  Yes    Education Details  Strength ABC Program    Person(s) Educated  Patient    Methods  Explanation;Demonstration;Handout    Comprehension  Verbalized understanding;Returned demonstration          PT Long Term Goals -  08/13/17 0924      PT LONG TERM GOAL #1   Title  Patient will be independent with her home exercise program to promote shoulder ROM.    Status  Achieved      PT LONG TERM GOAL #2   Title  Increase left shoulder active flexion ROM to >/= 140 degrees for increased ability to reach overhead.    Baseline  07/27/17- 155 degrees; 158 degrees-08/13/17    Status  Achieved      PT LONG TERM GOAL #3   Title  Increase left shoulder active abduction ROM to >/= 140 degrees for increased ability to reach overhead.    Baseline  07/27/17- 176; 184 degrees-08/13/17    Status  Achieved      PT LONG TERM GOAL #4   Baseline  07/27/17- pt has not tried laundry today but is able to perform 75% of normal household tasks; 100% back to all ADLs-08/13/17    Status  Achieved      PT LONG  TERM GOAL #5   Title  Patient will verbalize understanding of risk reduction practices related to lymphedema.    Baseline  07/27/17- pt was able to verbalize some risk reduction practices    Status  Achieved      Breast Clinic Goals - 04/25/17 1654      Patient will be able to verbalize understanding of pertinent lymphedema risk reduction practices relevant to her diagnosis specifically related to skin care.   Time  1    Period  Days    Status  Achieved      Patient will be able to return demonstrate and/or verbalize understanding of the post-op home exercise program related to regaining shoulder range of motion.   Time  1    Period  Days    Status  Achieved      Patient will be able to verbalize understanding of the importance of attending the postoperative After Breast Cancer Class for further lymphedema risk reduction education and therapeutic exercise.   Time  1    Period  Days    Status  Achieved           Plan - 08/13/17 9702    Clinical Impression Statement  Pt has made excellent progress overall and has met all goals. She is ready and feels ready for D/C at this time.     Rehab Potential  Excellent    Clinical Impairments Affecting Rehab Potential  None    PT Frequency  2x / week    PT Duration  4 weeks    PT Treatment/Interventions  ADLs/Self Care Home Management;Therapeutic exercise;Patient/family education;Passive range of motion;Scar mobilization;Manual techniques;Therapeutic activities    PT Next Visit Plan  D/C this visit.     PT Home Exercise Plan  supine scapular exercises, 3 way shoulder; and Strength ABC Program as was instructed    Consulted and Agree with Plan of Care  Patient       Patient will benefit from skilled therapeutic intervention in order to improve the following deficits and impairments:  Decreased range of motion, Impaired UE functional use, Pain, Decreased knowledge of precautions, Postural dysfunction, Decreased strength, Decreased scar  mobility  Visit Diagnosis: Abnormal posture  Stiffness of left shoulder, not elsewhere classified  Aftercare following surgery for neoplasm     Problem List Patient Active Problem List   Diagnosis Date Noted  . Port-A-Cath in place 07/31/2017  . Breast cancer ([place]) 06/20/2017  . Genetic testing 05/21/2017  .  Family history of breast cancer   . Family history of throat cancer   . Malignant neoplasm of upper-outer quadrant of left breast in female, estrogen receptor positive ([place]) 04/19/2017    Otelia Limes, PTA 08/13/2017, 12:33 PM  [place], [place], [address] Phone: [phone]   Fax:  [phone]  Name: BRINSLEY WENCE MRN: [id number] Date of Birth: Aug 09, 1975  PHYSICAL THERAPY DISCHARGE SUMMARY  Visits from Start of Care: 10  Current functional level related to goals / functional outcomes: See above   Remaining deficits: None   Education / Equipment: See above Plan: Patient agrees to discharge.  Patient goals were met. Patient is being discharged due to meeting the stated rehab goals.  ?????    Allyson Sabal [place], [place] 08/13/17 12:50 PM

## 2017-08-14 ENCOUNTER — Inpatient Hospital Stay: Payer: BLUE CROSS/BLUE SHIELD | Attending: Hematology and Oncology

## 2017-08-14 ENCOUNTER — Ambulatory Visit: Payer: BLUE CROSS/BLUE SHIELD

## 2017-08-14 ENCOUNTER — Telehealth: Payer: Self-pay | Admitting: Hematology and Oncology

## 2017-08-14 ENCOUNTER — Other Ambulatory Visit: Payer: BLUE CROSS/BLUE SHIELD

## 2017-08-14 ENCOUNTER — Encounter: Payer: Self-pay | Admitting: *Deleted

## 2017-08-14 ENCOUNTER — Inpatient Hospital Stay (HOSPITAL_BASED_OUTPATIENT_CLINIC_OR_DEPARTMENT_OTHER): Payer: BLUE CROSS/BLUE SHIELD | Admitting: Hematology and Oncology

## 2017-08-14 ENCOUNTER — Other Ambulatory Visit: Payer: Self-pay | Admitting: Hematology and Oncology

## 2017-08-14 ENCOUNTER — Inpatient Hospital Stay: Payer: BLUE CROSS/BLUE SHIELD

## 2017-08-14 DIAGNOSIS — Z170 Estrogen receptor positive status [ER+]: Secondary | ICD-10-CM

## 2017-08-14 DIAGNOSIS — C50412 Malignant neoplasm of upper-outer quadrant of left female breast: Secondary | ICD-10-CM

## 2017-08-14 DIAGNOSIS — Z5111 Encounter for antineoplastic chemotherapy: Secondary | ICD-10-CM | POA: Diagnosis not present

## 2017-08-14 DIAGNOSIS — Z5112 Encounter for antineoplastic immunotherapy: Secondary | ICD-10-CM | POA: Diagnosis present

## 2017-08-14 DIAGNOSIS — R5383 Other fatigue: Secondary | ICD-10-CM | POA: Insufficient documentation

## 2017-08-14 DIAGNOSIS — Z95828 Presence of other vascular implants and grafts: Secondary | ICD-10-CM

## 2017-08-14 LAB — CBC WITH DIFFERENTIAL/PLATELET
BASOS PCT: 1 %
Basophils Absolute: 0.1 10*3/uL (ref 0.0–0.1)
EOS ABS: 0 10*3/uL (ref 0.0–0.5)
EOS PCT: 0 %
HCT: 30.8 % — ABNORMAL LOW (ref 34.8–46.6)
HEMOGLOBIN: 10.7 g/dL — AB (ref 11.6–15.9)
Lymphocytes Relative: 26 %
Lymphs Abs: 1.7 10*3/uL (ref 0.9–3.3)
MCH: 33.4 pg (ref 25.1–34.0)
MCHC: 34.9 g/dL (ref 31.5–36.0)
MCV: 95.9 fL (ref 79.5–101.0)
MONO ABS: 0.6 10*3/uL (ref 0.1–0.9)
MONOS PCT: 10 %
NEUTROS PCT: 63 %
Neutro Abs: 4.1 10*3/uL (ref 1.5–6.5)
PLATELETS: 402 10*3/uL — AB (ref 145–400)
RBC: 3.21 MIL/uL — ABNORMAL LOW (ref 3.70–5.45)
RDW: 12 % (ref 11.2–14.5)
WBC: 6.4 10*3/uL (ref 3.9–10.3)

## 2017-08-14 LAB — COMPREHENSIVE METABOLIC PANEL
ALK PHOS: 104 U/L (ref 40–150)
ALT: 16 U/L (ref 0–55)
AST: 13 U/L (ref 5–34)
Albumin: 3.2 g/dL — ABNORMAL LOW (ref 3.5–5.0)
Anion gap: 10 (ref 3–11)
BUN: 11 mg/dL (ref 7–26)
CALCIUM: 8.4 mg/dL (ref 8.4–10.4)
CO2: 26 mmol/L (ref 22–29)
CREATININE: 0.68 mg/dL (ref 0.60–1.10)
Chloride: 106 mmol/L (ref 98–109)
GFR calc non Af Amer: >60 mL/min (ref >60)
Glucose, Bld: 118 mg/dL (ref 70–140)
Potassium: 3.6 mmol/L (ref 3.5–5.1)
SODIUM: 142 mmol/L (ref 136–145)
Total Bilirubin: 0.2 mg/dL (ref 0.2–1.2)
Total Protein: 6.1 g/dL — ABNORMAL LOW (ref 6.4–8.3)

## 2017-08-14 MED ORDER — SODIUM CHLORIDE 0.9 % IV SOLN
Freq: Once | INTRAVENOUS | Status: AC
  Administered 2017-08-14: 10:00:00 via INTRAVENOUS
  Filled 2017-08-14: qty 5

## 2017-08-14 MED ORDER — DIPHENHYDRAMINE HCL 25 MG PO CAPS
ORAL_CAPSULE | ORAL | Status: AC
Start: 2017-08-14 — End: 2017-08-14
  Filled 2017-08-14: qty 2

## 2017-08-14 MED ORDER — HEPARIN SOD (PORK) LOCK FLUSH 100 UNIT/ML IV SOLN
500.0000 [IU] | Freq: Once | INTRAVENOUS | Status: AC | PRN
  Administered 2017-08-14: 500 [IU]
  Filled 2017-08-14: qty 5

## 2017-08-14 MED ORDER — PALONOSETRON HCL INJECTION 0.25 MG/5ML
0.2500 mg | Freq: Once | INTRAVENOUS | Status: AC
  Administered 2017-08-14: 0.25 mg via INTRAVENOUS

## 2017-08-14 MED ORDER — PALONOSETRON HCL INJECTION 0.25 MG/5ML
INTRAVENOUS | Status: AC
  Filled 2017-08-14: qty 5

## 2017-08-14 MED ORDER — ACETAMINOPHEN 325 MG PO TABS
650.0000 mg | ORAL_TABLET | Freq: Once | ORAL | Status: AC
  Administered 2017-08-14: 650 mg via ORAL

## 2017-08-14 MED ORDER — ACETAMINOPHEN 325 MG PO TABS
ORAL_TABLET | ORAL | Status: AC
  Filled 2017-08-14: qty 2

## 2017-08-14 MED ORDER — SODIUM CHLORIDE 0.9% FLUSH
10.0000 mL | INTRAVENOUS | Status: DC | PRN
  Administered 2017-08-14: 10 mL
  Filled 2017-08-14: qty 10

## 2017-08-14 MED ORDER — DIPHENHYDRAMINE HCL 25 MG PO CAPS
50.0000 mg | ORAL_CAPSULE | Freq: Once | ORAL | Status: AC
  Administered 2017-08-14: 50 mg via ORAL

## 2017-08-14 MED ORDER — SODIUM CHLORIDE 0.9 % IV SOLN
420.0000 mg | Freq: Once | INTRAVENOUS | Status: AC
  Administered 2017-08-14: 420 mg via INTRAVENOUS
  Filled 2017-08-14: qty 14

## 2017-08-14 MED ORDER — SODIUM CHLORIDE 0.9% FLUSH
10.0000 mL | INTRAVENOUS | Status: DC | PRN
  Filled 2017-08-14: qty 10

## 2017-08-14 MED ORDER — SODIUM CHLORIDE 0.9 % IV SOLN
700.0000 mg | Freq: Once | INTRAVENOUS | Status: AC
  Administered 2017-08-14: 700 mg via INTRAVENOUS
  Filled 2017-08-14: qty 70

## 2017-08-14 MED ORDER — SODIUM CHLORIDE 0.9 % IV SOLN
Freq: Once | INTRAVENOUS | Status: AC
  Administered 2017-08-14: 10:00:00 via INTRAVENOUS

## 2017-08-14 MED ORDER — TRASTUZUMAB CHEMO 150 MG IV SOLR
6.0000 mg/kg | Freq: Once | INTRAVENOUS | Status: AC
  Administered 2017-08-14: 378 mg via INTRAVENOUS
  Filled 2017-08-14: qty 18

## 2017-08-14 NOTE — Progress Notes (Signed)
E  Patient Care Team: Carol Ada, MD as PCP - General (Family Medicine)  DIAGNOSIS:  Encounter Diagnosis  Name Primary?  . Malignant neoplasm of upper-outer quadrant of left breast in female, estrogen receptor positive ([place])     SUMMARY OF ONCOLOGIC HISTORY:   Malignant neoplasm of upper-outer quadrant of left breast in female, estrogen receptor positive ([place])   04/16/2017 Initial Diagnosis    Screening mammogram detected calcifications left breast UOQ 9.3 cm, at 2:00: 1.4 cm and at 1:00: 8 mm; biopsy of mass at 1:00: IDC grade 2 with DCIS ER 95%, PR 90%,Ki-67 45%, HER-2 positive ratio 5.18; iopsy of the 2:00 mass and calcifications were DCIS with suspicion for microinvasion, T1 cN0 stage IA clinical stage AJCC 8      05/09/2017 Genetic Testing    The patient had genetic testing due to a personal and family history of breast cancer.  The Common Hereditary Cancer Panel was ordered.  The Hereditary Gene Panel offered by [facility] includes sequencing and/or deletion duplication testing of the following 47 genes: APC, ATM, AXIN2, BARD1, BMPR1A, BRCA1, BRCA2, BRIP1, CDH1, CDKN2A (p14ARF), CDKN2A (p16INK4a), CKD4, CHEK2, CTNNA1, DICER1, EPCAM (Deletion/duplication testing only), GREM1 (promoter region deletion/duplication testing only), KIT, MEN1, MLH1, MSH2, MSH3, MSH6, MUTYH, NBN, NF1, NHTL1, PALB2, PDGFRA, PMS2, POLD1, POLE, PTEN, RAD50, RAD51C, RAD51D, SDHB, SDHC, SDHD, SMAD4, SMARCA4. STK11, TP53, TSC1, TSC2, and VHL.  The following genes were evaluated for sequence changes only: SDHA and HOXB13 c.251G>A variant only.  Results- No pathogenic variants identified.  A Variant of Uncertain Significance was identified in the gene APC c.-[id number]>G (Promoter 1B).  The date of this test report is 05/09/2017.       06/20/2017 Surgery    Left mastectomy: Multifocal IDC with DCIS tumor size is 1.1, 1.6 and 2.4 cm, margins negative, 0/5 lymph nodes negative, ER 95%, PR 90%, HER-2 positive ratio 5.18,  Ki-67 45% T2N0 stage Ib pathologic stage      07/24/2017 -  Chemotherapy    TCH P followed by Herceptin and Perjeta maintenance        CHIEF COMPLIANT: Cycle 2 TCH Perjeta (Taxotere discontinued after cycle 1 for severe maculopapular rash)  INTERVAL HISTORY: TAMSYN OWUSU is a 42 year old with above-mentioned history of left breast cancer treated with mastectomy and is currently on adjuvant chemotherapy with [place].  After her first cycle of chemotherapy she had profound maculopapular rash all over her face and chest and arms.  This finally got better with oral doxycycline.  She is here today for cycle 2 of her treatment.  She is very anxious to get a new treatment.  It appears that the rash could be a result of Taxotere fixed drug eruption.  This rash is finally subsided with doxycycline oral antibiotic  REVIEW OF SYSTEMS:   Constitutional: Denies fevers, chills or abnormal weight loss Eyes: Denies blurriness of vision Ears, nose, mouth, throat, and face: Denies mucositis or sore throat Respiratory: Denies cough, dyspnea or wheezes Cardiovascular: Denies palpitation, chest discomfort Gastrointestinal:  Denies nausea, heartburn or change in bowel habits Skin: Maculopapular rash on the face nearly resolved Lymphatics: Denies new lymphadenopathy or easy bruising Neurological:Denies numbness, tingling or new weaknesses Behavioral/Psych: Mood is stable, no new changes  Extremities: No lower extremity edema  All other systems were reviewed with the patient and are negative.  I have reviewed the past medical history, past surgical history, social history and family history with the patient and they are unchanged from previous note.  ALLERGIES:  has No Known Allergies.  MEDICATIONS:  Current Outpatient Medications  Medication Sig Dispense Refill  . acetaminophen (TYLENOL) 325 MG tablet Take 650 mg by mouth daily as needed for headache.    . clindamycin (CLINDAGEL) 1 % gel Apply  topically 2 (two) times daily. 30 g 0  . dextromethorphan-guaiFENesin (MUCINEX DM) 30-600 MG 12hr tablet Take 1 tablet by mouth 2 (two) times daily as needed for cough.    . doxycycline (VIBRA-TABS) 100 MG tablet Take 1 tablet (100 mg total) by mouth 2 (two) times daily. 20 tablet 0  . lidocaine-prilocaine (EMLA) cream Apply 1 application topically daily as needed (port access).     Marland Kitchen lidocaine-prilocaine (EMLA) cream Apply to affected area once 30 g 3  . LORazepam (ATIVAN) 0.5 MG tablet Take 1 tablet (0.5 mg total) by mouth at bedtime as needed for sleep. 30 tablet 0  . lovastatin (MEVACOR) 40 MG tablet Take 40 mg by mouth every evening.    . ondansetron (ZOFRAN) 8 MG tablet Take 1 tablet (8 mg total) by mouth 2 (two) times daily as needed (Nausea or vomiting). Begin 4 days after chemotherapy. 30 tablet 1  . pantoprazole (PROTONIX) 40 MG tablet Take 1 tablet (40 mg total) by mouth daily. 30 tablet 3  . prochlorperazine (COMPAZINE) 10 MG tablet Take 1 tablet (10 mg total) by mouth every 6 (six) hours as needed (Nausea or vomiting). 30 tablet 1  . tamoxifen (NOLVADEX) 20 MG tablet Take 1 tablet (20 mg total) by mouth daily. (Patient not taking: Reported on 08/07/2017) 30 tablet 0  . triamcinolone ointment (KENALOG) 0.5 % Apply 1 application topically 2 (two) times daily. 30 g 0   No current facility-administered medications for this visit.    Facility-Administered Medications Ordered in Other Visits  Medication Dose Route Frequency Provider Last Rate Last Dose  . sodium chloride flush (NS) 0.9 % injection 10 mL  10 mL Intravenous PRN Nicholas Lose, MD        PHYSICAL EXAMINATION: ECOG PERFORMANCE STATUS: 1 - Symptomatic but completely ambulatory  Vitals:   08/14/17 0859  BP: 138/75  Pulse: 85  Resp: 18  Temp: 98.3 F (36.8 C)  SpO2: 100%   Filed Weights   08/14/17 0859  Weight: 140 lb 3.2 oz (63.6 kg)    GENERAL:alert, no distress and comfortable SKIN: Maculopapular skin rash on  the face nearly resolved EYES: normal, Conjunctiva are pink and non-injected, sclera clear OROPHARYNX:no exudate, no erythema and lips, buccal mucosa, and tongue normal  NECK: supple, thyroid normal size, non-tender, without nodularity LYMPH:  no palpable lymphadenopathy in the cervical, axillary or inguinal LUNGS: clear to auscultation and percussion with normal breathing effort HEART: regular rate & rhythm and no murmurs and no lower extremity edema ABDOMEN:abdomen soft, non-tender and normal bowel sounds MUSCULOSKELETAL:no cyanosis of digits and no clubbing  NEURO: alert & oriented x 3 with fluent speech, no focal motor/sensory deficits EXTREMITIES: No lower extremity edema  LABORATORY DATA:  I have reviewed the data as listed CMP Latest Ref Rng & Units 08/07/2017 07/31/2017 07/24/2017  Glucose 70 - 140 mg/dL 168(H) 144(H) 174(H)  BUN 7 - 26 mg/dL '14 12 10  '$ Creatinine 0.60 - 1.10 mg/dL - 0.84 0.78  Sodium 136 - 145 mmol/L 142 135(L) 141  Potassium 3.3 - 4.7 mmol/L 3.2(L) 3.7 3.5  Chloride 98 - 109 mmol/L 104 98 107  CO2 22 - 29 mmol/L '29 25 22  '$ Calcium 8.4 - 10.4 mg/dL 8.5 8.9 9.0  Total Protein 6.4 - 8.3 g/dL 6.2(L) 6.6 7.0  Total Bilirubin 0.2 - 1.2 mg/dL 0.2 0.5 0.3  Alkaline Phos 40 - 150 U/L 144 95 99  AST 5 - 34 U/L '15 14 15  '$ ALT 0 - 55 U/L '29 18 15    '$ Lab Results  Component Value Date   WBC 6.4 08/14/2017   HGB 10.7 (L) 08/14/2017   HCT 30.8 (L) 08/14/2017   MCV 95.9 08/14/2017   PLT 402 (H) 08/14/2017   NEUTROABS 4.1 08/14/2017    ASSESSMENT & PLAN:  Malignant neoplasm of upper-outer quadrant of left breast in female, estrogen receptor positive ([place]) 06/20/2017 left mastectomy: 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 Adjuvant TCH Perjeta followed by Herceptin and Perjeta maintenance for 1 year 2.  followed by adjuvant antiestrogen therapy -------------------------------------------------------------------- Current treatment: Cycle 2 day 1 TCH Perjeta (Taxotere discontinued from cycle 2 due to rash) Chemo Toxicities: 1. Severe maculopapular rash on the face and chest: I suspect this is related to Taxotere.  We will discontinue Taxotere further.  She will get carboplatin Herceptin and Perjeta. 2. Neulasta related bone and joint pain: We will discontinue Neulasta because she will not be getting Taxotere any further 3.  Thrombocytopenia due to chemotherapy: Will be monitored  RTC in 1 weeks for toxicity check.  I spent 25 minutes talking to the patient of which more than half was spent in counseling and coordination of care.  No orders of the defined types were placed in this encounter.  The patient has a good understanding of the overall plan. she agrees with it. she will call with any problems that may develop before the next visit here.   Harriette Ohara, MD 08/14/17

## 2017-08-14 NOTE — Assessment & Plan Note (Signed)
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 Adjuvant TCH Perjeta followed by Herceptin and Perjeta maintenance for 1 year 2. followed by adjuvant antiestrogen therapy -------------------------------------------------------------------- Current treatment: Cycle 2 day 1 TCH Perjeta (Taxotere discontinued from cycle 2 due to rash) Chemo Toxicities: 1. Severe maculopapular rash on the face and chest: I suspect this is related to Taxotere.  We will discontinue Taxotere further.  She will get carboplatin Herceptin and Perjeta. 2. Neulasta related bone and joint pain: We will discontinue Neulasta because she will not be getting Taxotere any further 3.  Thrombocytopenia due to chemotherapy: Will be monitored  RTC in 1 weeks for toxicity check.

## 2017-08-14 NOTE — Patient Instructions (Signed)
[facility] Discharge Instructions for Patients Receiving Chemotherapy  Today you received the following chemotherapy agents Herceptin, Perjeta, Carboplatin  To help prevent nausea and vomiting after your treatment, we encourage you to take your nausea medication as directed   If you develop nausea and vomiting that is not controlled by your nausea medication, call the clinic.   BELOW ARE SYMPTOMS THAT SHOULD BE REPORTED IMMEDIATELY:  *FEVER GREATER THAN 100.5 F  *CHILLS WITH OR WITHOUT FEVER  NAUSEA AND VOMITING THAT IS NOT CONTROLLED WITH YOUR NAUSEA MEDICATION  *UNUSUAL SHORTNESS OF BREATH  *UNUSUAL BRUISING OR BLEEDING  TENDERNESS IN MOUTH AND THROAT WITH OR WITHOUT PRESENCE OF ULCERS  *URINARY PROBLEMS  *BOWEL PROBLEMS  UNUSUAL RASH Items with * indicate a potential emergency and should be followed up as soon as possible.  Feel free to call the clinic should you have any questions or concerns. The clinic phone number is [phone].  Please show the [facility] at check-in to the Emergency Department and triage nurse.

## 2017-08-14 NOTE — Telephone Encounter (Signed)
Scheduled appts per 2/5 los - patient did not want avs or calendar.

## 2017-08-21 ENCOUNTER — Inpatient Hospital Stay (HOSPITAL_BASED_OUTPATIENT_CLINIC_OR_DEPARTMENT_OTHER): Payer: BLUE CROSS/BLUE SHIELD | Admitting: Hematology and Oncology

## 2017-08-21 ENCOUNTER — Inpatient Hospital Stay: Payer: BLUE CROSS/BLUE SHIELD

## 2017-08-21 DIAGNOSIS — Z5111 Encounter for antineoplastic chemotherapy: Secondary | ICD-10-CM | POA: Diagnosis not present

## 2017-08-21 DIAGNOSIS — Z170 Estrogen receptor positive status [ER+]: Secondary | ICD-10-CM

## 2017-08-21 DIAGNOSIS — C50412 Malignant neoplasm of upper-outer quadrant of left female breast: Secondary | ICD-10-CM

## 2017-08-21 DIAGNOSIS — R5383 Other fatigue: Secondary | ICD-10-CM | POA: Diagnosis not present

## 2017-08-21 DIAGNOSIS — Z95828 Presence of other vascular implants and grafts: Secondary | ICD-10-CM

## 2017-08-21 LAB — COMPREHENSIVE METABOLIC PANEL
ALT: 22 U/L (ref 0–55)
AST: 21 U/L (ref 5–34)
Albumin: 3.5 g/dL (ref 3.5–5.0)
Alkaline Phosphatase: 101 U/L (ref 40–150)
Anion gap: 11 (ref 3–11)
BUN: 12 mg/dL (ref 7–26)
CHLORIDE: 103 mmol/L (ref 98–109)
CO2: 25 mmol/L (ref 22–29)
CREATININE: 0.73 mg/dL (ref 0.60–1.10)
Calcium: 8.6 mg/dL (ref 8.4–10.4)
GFR calc Af Amer: >60 mL/min (ref >60)
GFR calc non Af Amer: >60 mL/min (ref >60)
Glucose, Bld: 123 mg/dL (ref 70–140)
POTASSIUM: 3.6 mmol/L (ref 3.5–5.1)
SODIUM: 139 mmol/L (ref 136–145)
Total Bilirubin: 0.3 mg/dL (ref 0.2–1.2)
Total Protein: 6.5 g/dL (ref 6.4–8.3)

## 2017-08-21 LAB — CBC WITH DIFFERENTIAL/PLATELET
Basophils Absolute: 0 10*3/uL (ref 0.0–0.1)
Basophils Relative: 0 %
EOS ABS: 0.1 10*3/uL (ref 0.0–0.5)
Eosinophils Relative: 1 %
HEMATOCRIT: 31.6 % — AB (ref 34.8–46.6)
HEMOGLOBIN: 10.9 g/dL — AB (ref 11.6–15.9)
LYMPHS ABS: 1.3 10*3/uL (ref 0.9–3.3)
LYMPHS PCT: 29 %
MCH: 33.2 pg (ref 25.1–34.0)
MCHC: 34.7 g/dL (ref 31.5–36.0)
MCV: 95.6 fL (ref 79.5–101.0)
MONOS PCT: 12 %
Monocytes Absolute: 0.5 10*3/uL (ref 0.1–0.9)
NEUTROS ABS: 2.6 10*3/uL (ref 1.5–6.5)
Neutrophils Relative %: 58 %
Platelets: 220 10*3/uL (ref 145–400)
RBC: 3.3 MIL/uL — ABNORMAL LOW (ref 3.70–5.45)
RDW: 12.4 % (ref 11.2–14.5)
WBC: 4.4 10*3/uL (ref 3.9–10.3)

## 2017-08-21 MED ORDER — SODIUM CHLORIDE 0.9% FLUSH
10.0000 mL | INTRAVENOUS | Status: DC | PRN
  Administered 2017-08-21: 10 mL via INTRAVENOUS
  Filled 2017-08-21: qty 10

## 2017-08-21 MED ORDER — HEPARIN SOD (PORK) LOCK FLUSH 100 UNIT/ML IV SOLN
500.0000 [IU] | Freq: Once | INTRAVENOUS | Status: AC | PRN
  Administered 2017-08-21: 500 [IU] via INTRAVENOUS
  Filled 2017-08-21: qty 5

## 2017-08-21 NOTE — Assessment & Plan Note (Signed)
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 Adjuvant TCH Perjeta followed by Herceptin and Perjeta maintenance for 1 year 2. followed by adjuvant antiestrogen therapy -------------------------------------------------------------------- Current treatment: Cycle 3 day1 TCH Perjeta (Taxotere discontinued from cycle 2 due to rash) Chemo Toxicities: 1.Severe maculopapular rash on the face and chest: I suspect this is related to Taxotere.  We will discontinue Taxotere further.  She will get carboplatin Herceptin and Perjeta. 2.Neulasta related bone and joint pain: We will discontinue Neulasta because she will not be getting Taxotere any further 3.Thrombocytopenia due to chemotherapy: Will be monitored  RTC in 2 weeks for cycle 3.

## 2017-08-21 NOTE — Progress Notes (Signed)
Patient Care Team: Carol Ada, MD as PCP - General (Family Medicine)  DIAGNOSIS:  Encounter Diagnosis  Name Primary?  . Malignant neoplasm of upper-outer quadrant of left breast in female, estrogen receptor positive ([place])     SUMMARY OF ONCOLOGIC HISTORY:   Malignant neoplasm of upper-outer quadrant of left breast in female, estrogen receptor positive ([place])   04/16/2017 Initial Diagnosis    Screening mammogram detected calcifications left breast UOQ 9.3 cm, at 2:00: 1.4 cm and at 1:00: 8 mm; biopsy of mass at 1:00: IDC grade 2 with DCIS ER 95%, PR 90%,Ki-67 45%, HER-2 positive ratio 5.18; iopsy of the 2:00 mass and calcifications were DCIS with suspicion for microinvasion, T1 cN0 stage IA clinical stage AJCC 8      05/09/2017 Genetic Testing    The patient had genetic testing due to a personal and family history of breast cancer.  The Common Hereditary Cancer Panel was ordered.  The Hereditary Gene Panel offered by [facility] includes sequencing and/or deletion duplication testing of the following 47 genes: APC, ATM, AXIN2, BARD1, BMPR1A, BRCA1, BRCA2, BRIP1, CDH1, CDKN2A (p14ARF), CDKN2A (p16INK4a), CKD4, CHEK2, CTNNA1, DICER1, EPCAM (Deletion/duplication testing only), GREM1 (promoter region deletion/duplication testing only), KIT, MEN1, MLH1, MSH2, MSH3, MSH6, MUTYH, NBN, NF1, NHTL1, PALB2, PDGFRA, PMS2, POLD1, POLE, PTEN, RAD50, RAD51C, RAD51D, SDHB, SDHC, SDHD, SMAD4, SMARCA4. STK11, TP53, TSC1, TSC2, and VHL.  The following genes were evaluated for sequence changes only: SDHA and HOXB13 c.251G>A variant only.  Results- No pathogenic variants identified.  A Variant of Uncertain Significance was identified in the gene APC c.-30626C>G (Promoter 1B).  The date of this test report is 05/09/2017.       06/20/2017 Surgery    Left mastectomy: Multifocal IDC with DCIS tumor size is 1.1, 1.6 and 2.4 cm, margins negative, 0/5 lymph nodes negative, ER 95%, PR 90%, HER-2 positive ratio 5.18,  Ki-67 45% T2N0 stage Ib pathologic stage      07/24/2017 -  Chemotherapy    TCH P followed by Herceptin and Perjeta maintenance        CHIEF COMPLIANT: Patient tolerated cycle 2 extremely well.  INTERVAL HISTORY: Alexis Underwood is a 42 year old with above-mentioned history of left breast cancer treated with mastectomy and is currently on adjuvant chemotherapy.  After cycle 1 she had profound rash which has resolved.  With cycle 2 of removed Taxotere.  She did not develop any rash since that time.  We also remove Neulasta.  Since then she has not had any joint and bone pain.  She does have fatigue and tiredness but she is able to manage that fairly well.  REVIEW OF SYSTEMS:   Constitutional: Denies fevers, chills or abnormal weight loss Eyes: Denies blurriness of vision Ears, nose, mouth, throat, and face: Denies mucositis or sore throat Respiratory: Denies cough, dyspnea or wheezes Cardiovascular: Denies palpitation, chest discomfort Gastrointestinal:  Denies nausea, heartburn or change in bowel habits Skin: Denies abnormal skin rashes Lymphatics: Denies new lymphadenopathy or easy bruising Neurological:Denies numbness, tingling or new weaknesses Behavioral/Psych: Mood is stable, no new changes  Extremities: No lower extremity edema  All other systems were reviewed with the patient and are negative.  I have reviewed the past medical history, past surgical history, social history and family history with the patient and they are unchanged from previous note.  ALLERGIES:  has No Known Allergies.  MEDICATIONS:  Current Outpatient Medications  Medication Sig Dispense Refill  . acetaminophen (TYLENOL) 325 MG tablet Take 650 mg by  mouth daily as needed for headache.    . clindamycin (CLINDAGEL) 1 % gel Apply topically 2 (two) times daily. 30 g 0  . dextromethorphan-guaiFENesin (MUCINEX DM) 30-600 MG 12hr tablet Take 1 tablet by mouth 2 (two) times daily as needed for cough.    .  doxycycline (VIBRA-TABS) 100 MG tablet Take 1 tablet (100 mg total) by mouth 2 (two) times daily. 20 tablet 0  . lidocaine-prilocaine (EMLA) cream Apply 1 application topically daily as needed (port access).     Marland Kitchen lidocaine-prilocaine (EMLA) cream Apply to affected area once 30 g 3  . LORazepam (ATIVAN) 0.5 MG tablet Take 1 tablet (0.5 mg total) by mouth at bedtime as needed for sleep. 30 tablet 0  . lovastatin (MEVACOR) 40 MG tablet Take 40 mg by mouth every evening.    . ondansetron (ZOFRAN) 8 MG tablet Take 1 tablet (8 mg total) by mouth 2 (two) times daily as needed (Nausea or vomiting). Begin 4 days after chemotherapy. 30 tablet 1  . pantoprazole (PROTONIX) 40 MG tablet Take 1 tablet (40 mg total) by mouth daily. 30 tablet 3  . prochlorperazine (COMPAZINE) 10 MG tablet Take 1 tablet (10 mg total) by mouth every 6 (six) hours as needed (Nausea or vomiting). 30 tablet 1  . tamoxifen (NOLVADEX) 20 MG tablet Take 1 tablet (20 mg total) by mouth daily. (Patient not taking: Reported on 08/07/2017) 30 tablet 0  . triamcinolone ointment (KENALOG) 0.5 % Apply 1 application topically 2 (two) times daily. 30 g 0   No current facility-administered medications for this visit.     PHYSICAL EXAMINATION: ECOG PERFORMANCE STATUS: 1 - Symptomatic but completely ambulatory  Vitals:   08/21/17 1119  BP: (!) 150/90  Pulse: 92  Resp: 18  Temp: 98.5 F (36.9 C)  SpO2: 100%   Filed Weights   08/21/17 1119  Weight: 139 lb 14.4 oz (63.5 kg)    GENERAL:alert, no distress and comfortable SKIN: skin color, texture, turgor are normal, no rashes or significant lesions EYES: normal, Conjunctiva are pink and non-injected, sclera clear OROPHARYNX:no exudate, no erythema and lips, buccal mucosa, and tongue normal  NECK: supple, thyroid normal size, non-tender, without nodularity LYMPH:  no palpable lymphadenopathy in the cervical, axillary or inguinal LUNGS: clear to auscultation and percussion with normal  breathing effort HEART: regular rate & rhythm and no murmurs and no lower extremity edema ABDOMEN:abdomen soft, non-tender and normal bowel sounds MUSCULOSKELETAL:no cyanosis of digits and no clubbing  NEURO: alert & oriented x 3 with fluent speech, no focal motor/sensory deficits EXTREMITIES: No lower extremity edema  LABORATORY DATA:  I have reviewed the data as listed CMP Latest Ref Rng & Units 08/21/2017 08/14/2017 08/07/2017  Glucose 70 - 140 mg/dL 123 118 168(H)  BUN 7 - 26 mg/dL '12 11 14  '$ Creatinine 0.60 - 1.10 mg/dL 0.73 0.68 0.82  Sodium 136 - 145 mmol/L 139 142 142  Potassium 3.5 - 5.1 mmol/L 3.6 3.6 3.2(L)  Chloride 98 - 109 mmol/L 103 106 104  CO2 22 - 29 mmol/L '25 26 29  '$ Calcium 8.4 - 10.4 mg/dL 8.6 8.4 8.5  Total Protein 6.4 - 8.3 g/dL 6.5 6.1(L) 6.2(L)  Total Bilirubin 0.2 - 1.2 mg/dL 0.3 0.2 0.2  Alkaline Phos 40 - 150 U/L 101 104 144  AST 5 - 34 U/L '21 13 15  '$ ALT 0 - 55 U/L '22 16 29    '$ Lab Results  Component Value Date   WBC 4.4 08/21/2017  HGB 10.9 (L) 08/21/2017   HCT 31.6 (L) 08/21/2017   MCV 95.6 08/21/2017   PLT 220 08/21/2017   NEUTROABS 2.6 08/21/2017    ASSESSMENT & PLAN:  Malignant neoplasm of upper-outer quadrant of left breast in female, estrogen receptor positive ([place]) 06/20/2017 left mastectomy: 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 Adjuvant TCH Perjeta followed by Herceptin and Perjeta maintenance for 1 year 2. followed by adjuvant antiestrogen therapy -------------------------------------------------------------------- Current treatment: Cycle 3 day1 TCH Perjeta (Taxotere discontinued from cycle 2 due to rash) Chemo Toxicities: 1.Severe maculopapular rash on the face and chest: I suspect this is related to Taxotere.  We will discontinue Taxotere further.  She will get  carboplatin Herceptin and Perjeta. 2.Neulasta related bone and joint pain: We will discontinue Neulasta because she will not be getting Taxotere any further 3.Thrombocytopenia due to chemotherapy: Will be monitored  RTC in 2 weeks for cycle 3.  I spent 25 minutes talking to the patient of which more than half was spent in counseling and coordination of care.  No orders of the defined types were placed in this encounter.  The patient has a good understanding of the overall plan. she agrees with it. she will call with any problems that may develop before the next visit here.   Harriette Ohara, MD 08/21/17

## 2017-08-22 ENCOUNTER — Telehealth: Payer: Self-pay | Admitting: Hematology and Oncology

## 2017-08-22 NOTE — Telephone Encounter (Signed)
Mailed patient calendar of upcoming February and march appointments.

## 2017-09-03 ENCOUNTER — Other Ambulatory Visit: Payer: Self-pay | Admitting: *Deleted

## 2017-09-03 DIAGNOSIS — Z170 Estrogen receptor positive status [ER+]: Principal | ICD-10-CM

## 2017-09-03 DIAGNOSIS — C50412 Malignant neoplasm of upper-outer quadrant of left female breast: Secondary | ICD-10-CM

## 2017-09-04 ENCOUNTER — Ambulatory Visit: Payer: BLUE CROSS/BLUE SHIELD

## 2017-09-04 ENCOUNTER — Telehealth: Payer: Self-pay

## 2017-09-04 ENCOUNTER — Other Ambulatory Visit: Payer: BLUE CROSS/BLUE SHIELD

## 2017-09-04 ENCOUNTER — Ambulatory Visit: Payer: BLUE CROSS/BLUE SHIELD | Admitting: Hematology and Oncology

## 2017-09-04 NOTE — Telephone Encounter (Signed)
Pt called to cancel todays appointments. Returned pt call and patient stated she is having symptoms of a stomach virus. Scheduling will call to reschedule.  Cyndia Bent RN

## 2017-09-04 NOTE — Assessment & Plan Note (Deleted)
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 Adjuvant TCH Perjeta followed by Herceptin and Perjeta maintenance for 1 year 2. followed by adjuvant antiestrogen therapy -------------------------------------------------------------------- Current treatment: [place] Perjeta(Taxotere discontinued from cycle 2 due to rash) Chemo Toxicities: 1.Severe maculopapular rash on the face and chest:Related to Taxotere. We discontinued Taxotere further. She is now receiving carboplatin Herceptin and Perjeta. 2.Neulasta related bone and joint pain: Resolved since we discontinue Neulasta  3.Thrombocytopenia due to chemotherapy: Will be monitored  RTC in3 weeks for cycle 4.

## 2017-09-06 ENCOUNTER — Telehealth: Payer: Self-pay | Admitting: Hematology and Oncology

## 2017-09-06 NOTE — Telephone Encounter (Signed)
Spoke with patient and confirmed appts per 2/27 sch msg

## 2017-09-11 ENCOUNTER — Telehealth: Payer: Self-pay

## 2017-09-11 NOTE — Telephone Encounter (Signed)
Alexis Underwood did a walk in today to speak with someone regarding a return to work letter. She is still on active treatment but feels she can return to work on a part time basis. I informed her I would start a return to work letter for her and her and Mendel Ryder, NP could discuss it further at her next appt on Friday.  Cyndia Bent RN

## 2017-09-13 ENCOUNTER — Encounter (HOSPITAL_COMMUNITY): Payer: Self-pay | Admitting: Internal Medicine

## 2017-09-13 ENCOUNTER — Other Ambulatory Visit (HOSPITAL_COMMUNITY): Payer: BLUE CROSS/BLUE SHIELD

## 2017-09-13 ENCOUNTER — Other Ambulatory Visit: Payer: Self-pay

## 2017-09-13 ENCOUNTER — Ambulatory Visit (HOSPITAL_BASED_OUTPATIENT_CLINIC_OR_DEPARTMENT_OTHER)
Admission: RE | Admit: 2017-09-13 | Discharge: 2017-09-13 | Disposition: A | Discharge status: Home / Self Care | Payer: BLUE CROSS/BLUE SHIELD | Source: Ambulatory Visit | Attending: Family Medicine | Admitting: Family Medicine

## 2017-09-13 ENCOUNTER — Ambulatory Visit (HOSPITAL_COMMUNITY)
Admission: RE | Admit: 2017-09-13 | Discharge: 2017-09-13 | Disposition: A | Discharge status: Home / Self Care | Payer: BLUE CROSS/BLUE SHIELD | Source: Ambulatory Visit | Attending: Internal Medicine | Admitting: Internal Medicine

## 2017-09-13 VITALS — BP 134/91 | HR 88 | Wt 143.5 lb

## 2017-09-13 DIAGNOSIS — Z9889 Other specified postprocedural states: Secondary | ICD-10-CM | POA: Diagnosis not present

## 2017-09-13 DIAGNOSIS — C50412 Malignant neoplasm of upper-outer quadrant of left female breast: Secondary | ICD-10-CM | POA: Insufficient documentation

## 2017-09-13 DIAGNOSIS — E785 Hyperlipidemia, unspecified: Secondary | ICD-10-CM | POA: Insufficient documentation

## 2017-09-13 DIAGNOSIS — Z1501 Genetic susceptibility to malignant neoplasm of breast: Secondary | ICD-10-CM | POA: Insufficient documentation

## 2017-09-13 DIAGNOSIS — Z808 Family history of malignant neoplasm of other organs or systems: Secondary | ICD-10-CM | POA: Insufficient documentation

## 2017-09-13 DIAGNOSIS — Z79899 Other long term (current) drug therapy: Secondary | ICD-10-CM | POA: Diagnosis not present

## 2017-09-13 DIAGNOSIS — Z9012 Acquired absence of left breast and nipple: Secondary | ICD-10-CM | POA: Insufficient documentation

## 2017-09-13 DIAGNOSIS — Z8249 Family history of ischemic heart disease and other diseases of the circulatory system: Secondary | ICD-10-CM | POA: Diagnosis not present

## 2017-09-13 DIAGNOSIS — R030 Elevated blood-pressure reading, without diagnosis of hypertension: Secondary | ICD-10-CM | POA: Insufficient documentation

## 2017-09-13 DIAGNOSIS — Z803 Family history of malignant neoplasm of breast: Secondary | ICD-10-CM | POA: Insufficient documentation

## 2017-09-13 DIAGNOSIS — Z170 Estrogen receptor positive status [ER+]: Secondary | ICD-10-CM

## 2017-09-13 NOTE — Addendum Note (Signed)
Encounter addended by: Scarlette Calico, RN on: 09/13/2017 3:48 PM  Actions taken: Sign clinical note

## 2017-09-13 NOTE — Progress Notes (Signed)
CARDIO-ONCOLOGY CLINIC CONSULT NOTE  Referring Physician: Lindi Adie   HPI: Ms. Pfeifer is 42 y.o. 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  Denies any significant PMHx other than her breast CA.   Did not tolerate taxotere due to severe rash. Now on [place], hercetptin and Perjeta. Due for 3rd treatment tomorrow. No SOB, edema or palpitations   Echo 09/13/17: EF 60-65% GLS -21.1% Personally reviewed  Echo (06/14/17); EF 55-60% normal RV. Normal diastolic function. GLS -23.3. LS not adequate    SUMMARY OF ONCOLOGIC HISTORY:       Malignant neoplasm of upper-outer quadrant of left breast in female, estrogen receptor positive ([place])   04/16/2017 Initial Diagnosis    Screening mammogram detected calcifications left breast UOQ 9.3 cm, at 2:00: 1.4 cm and at 1:00: 8 mm; biopsy of mass at 1:00: IDC grade 2 with DCIS ER 95%, PR 90%,Ki-67 45%, HER-2 positive ratio 5.18; iopsy of the 2:00 mass and calcifications were DCIS with suspicion for microinvasion, T1 cN0 stage IA clinical stage AJCC 8      05/09/2017 Genetic Testing    The patient had genetic testing due to a personal and family history of breast cancer.  The Common Hereditary Cancer Panel was ordered.  The Hereditary Gene Panel offered by [facility] includes sequencing and/or deletion duplication testing of the following 47 genes: APC, ATM, AXIN2, BARD1, BMPR1A, BRCA1, BRCA2, BRIP1, CDH1, CDKN2A (p14ARF), CDKN2A (p16INK4a), CKD4, CHEK2, CTNNA1, DICER1, EPCAM (Deletion/duplication testing only), GREM1 (promoter region deletion/duplication testing only), KIT, MEN1, MLH1, MSH2, MSH3, MSH6, MUTYH, NBN, NF1, NHTL1, PALB2, PDGFRA, PMS2, POLD1, POLE, PTEN, RAD50, RAD51C, RAD51D, SDHB, SDHC, SDHD, SMAD4, SMARCA4. STK11, TP53, TSC1, TSC2, and VHL.  The following genes were evaluated for sequence changes only: SDHA and HOXB13  c.251G>A variant only.  Results- No pathogenic variants identified.  A Variant of Uncertain Significance was identified in the gene APC c.-30626C>G (Promoter 1B).  The date of this test report is 05/09/2017.       06/20/2017 Surgery    Left mastectomy: Multifocal IDC with DCIS tumor size is 1.1, 1.6 and 2.4 cm, margins negative, 0/5 lymph nodes negative, ER 95%, PR 90%, HER-2 positive ratio 5.18, Ki-67 45% T2N0 stage Ib pathologic stage      07/24/2017 -  Chemotherapy    TCH P followed by Herceptin and Perjeta maintenance          Past Medical History:  Diagnosis Date  . Breast cancer ([place])   . Family history of breast cancer   . Family history of throat cancer   . History of kidney stones   . Hyperlipidemia   . SVD (spontaneous vaginal delivery)     Current Outpatient Medications  Medication Sig Dispense Refill  . acetaminophen (TYLENOL) 325 MG tablet Take 650 mg by mouth daily as needed for headache.    . clindamycin (CLINDAGEL) 1 % gel Apply topically 2 (two) times daily. 30 g 0  . dextromethorphan-guaiFENesin (MUCINEX DM) 30-600 MG 12hr tablet Take 1 tablet by mouth 2 (two) times daily as needed for cough.    . lidocaine-prilocaine (EMLA) cream Apply 1 application topically daily as needed (port access).     . LORazepam (ATIVAN) 0.5 MG tablet Take 1 tablet (0.5 mg total) by mouth at bedtime as needed for sleep. 30 tablet 0  . lovastatin (MEVACOR) 40 MG tablet Take 40 mg by mouth every evening.    Marland Kitchen  ondansetron (ZOFRAN) 8 MG tablet Take 1 tablet (8 mg total) by mouth 2 (two) times daily as needed (Nausea or vomiting). Begin 4 days after chemotherapy. 30 tablet 1  . pantoprazole (PROTONIX) 40 MG tablet Take 1 tablet (40 mg total) by mouth daily. 30 tablet 3  . prochlorperazine (COMPAZINE) 10 MG tablet Take 1 tablet (10 mg total) by mouth every 6 (six) hours as needed (Nausea or vomiting). 30 tablet 1  . tamoxifen (NOLVADEX) 20 MG tablet Take 1 tablet (20 mg  total) by mouth daily. 30 tablet 0  . triamcinolone ointment (KENALOG) 0.5 % Apply 1 application topically 2 (two) times daily. 30 g 0   No current facility-administered medications for this encounter.     No Known Allergies    Social History   Socioeconomic History  . Marital status: Married    Spouse name: Not on file  . Number of children: Not on file  . Years of education: Not on file  . Highest education level: Not on file  Social Needs  . Financial resource strain: Not on file  . Food insecurity - worry: Not on file  . Food insecurity - inability: Not on file  . Transportation needs - medical: Not on file  . Transportation needs - non-medical: Not on file  Occupational History  . Not on file  Tobacco Use  . Smoking status: Never Smoker  . Smokeless tobacco: Never Used  Substance and Sexual Activity  . Alcohol use: No  . Drug use: No  . Sexual activity: Not on file  Other Topics Concern  . Not on file  Social History Narrative  . Not on file      Family History  Problem Relation Age of Onset  . Heart attack Mother   . Hyperlipidemia Mother   . Hypertension Mother   . Heart failure Mother   . Breast cancer Paternal Grandmother 31  . Heart disease Maternal Grandfather        died in 70's  . Throat cancer Paternal Grandfather 34    Vitals:   09/13/17 1512  BP: (!) 134/91  Pulse: 88  SpO2: 100%  Weight: 143 lb 8 oz (65.1 kg)    PHYSICAL EXAM: General:  Well appearing. No resp difficulty HEENT: normal x for alopecia Neck: supple. no JVD. Carotids 2+ bilat; no bruits. No lymphadenopathy or thryomegaly appreciated. Cor: PMI nondisplaced. Regular rate & rhythm. No rubs, gallops or murmurs. Lungs: clear Abdomen: soft, nontender, nondistended. No hepatosplenomegaly. No bruits or masses. Good bowel sounds. Extremities: no cyanosis, clubbing, rash, edema Neuro: alert & orientedx3, cranial nerves grossly intact. moves all 4 extremities w/o difficulty. Affect  pleasant   ASSESSMENT & PLAN: 1. Left breast Cancer, triple positive - ER 95%, PR 90%,Ki-67 45%, HER-2 positive ratio 5.18; - T1 cN0 stage IA clinical stage AJCC 8 -I reviewed echos personally. EF and Doppler parameters stable. No HF on exam. Continue Herceptin.   2. High blood pressure - slightly elevated here but normally well controlled in [place] log (checked personally)  Glori Bickers, MD  3:41 PM

## 2017-09-13 NOTE — Patient Instructions (Signed)
Your physician recommends that you schedule a follow-up appointment in: 3 months with echocardiogram  

## 2017-09-13 NOTE — Progress Notes (Signed)
  Echocardiogram 2D Echocardiogram has been performed.  Braun Rocca T Delmi Fulfer 09/13/2017, 3:06 PM

## 2017-09-14 ENCOUNTER — Inpatient Hospital Stay: Payer: BLUE CROSS/BLUE SHIELD

## 2017-09-14 ENCOUNTER — Inpatient Hospital Stay: Payer: BLUE CROSS/BLUE SHIELD | Attending: Hematology and Oncology | Admitting: Adult Health

## 2017-09-14 ENCOUNTER — Encounter: Payer: Self-pay | Admitting: Adult Health

## 2017-09-14 VITALS — BP 142/93 | HR 81 | Temp 98.2°F | Resp 17 | Ht 61.0 in | Wt 144.0 lb

## 2017-09-14 DIAGNOSIS — Z170 Estrogen receptor positive status [ER+]: Principal | ICD-10-CM

## 2017-09-14 DIAGNOSIS — L739 Follicular disorder, unspecified: Secondary | ICD-10-CM

## 2017-09-14 DIAGNOSIS — R102 Pelvic and perineal pain: Secondary | ICD-10-CM | POA: Insufficient documentation

## 2017-09-14 DIAGNOSIS — R530 Neoplastic (malignant) related fatigue: Secondary | ICD-10-CM | POA: Diagnosis not present

## 2017-09-14 DIAGNOSIS — Z803 Family history of malignant neoplasm of breast: Secondary | ICD-10-CM | POA: Diagnosis not present

## 2017-09-14 DIAGNOSIS — Z808 Family history of malignant neoplasm of other organs or systems: Secondary | ICD-10-CM | POA: Insufficient documentation

## 2017-09-14 DIAGNOSIS — C50412 Malignant neoplasm of upper-outer quadrant of left female breast: Secondary | ICD-10-CM

## 2017-09-14 DIAGNOSIS — Z95828 Presence of other vascular implants and grafts: Secondary | ICD-10-CM

## 2017-09-14 DIAGNOSIS — Z5111 Encounter for antineoplastic chemotherapy: Secondary | ICD-10-CM | POA: Insufficient documentation

## 2017-09-14 DIAGNOSIS — M549 Dorsalgia, unspecified: Secondary | ICD-10-CM | POA: Insufficient documentation

## 2017-09-14 DIAGNOSIS — N920 Excessive and frequent menstruation with regular cycle: Secondary | ICD-10-CM | POA: Diagnosis not present

## 2017-09-14 DIAGNOSIS — Z5112 Encounter for antineoplastic immunotherapy: Secondary | ICD-10-CM | POA: Insufficient documentation

## 2017-09-14 LAB — CBC WITH DIFFERENTIAL/PLATELET
BASOS ABS: 0 10*3/uL (ref 0.0–0.1)
BASOS PCT: 1 %
Eosinophils Absolute: 0.1 10*3/uL (ref 0.0–0.5)
Eosinophils Relative: 2 %
HEMATOCRIT: 31.5 % — AB (ref 34.8–46.6)
HEMOGLOBIN: 10.8 g/dL — AB (ref 11.6–15.9)
Lymphocytes Relative: 25 %
Lymphs Abs: 1.4 10*3/uL (ref 0.9–3.3)
MCH: 34 pg (ref 25.1–34.0)
MCHC: 34.2 g/dL (ref 31.5–36.0)
MCV: 99.3 fL (ref 79.5–101.0)
Monocytes Absolute: 0.5 10*3/uL (ref 0.1–0.9)
Monocytes Relative: 9 %
NEUTROS ABS: 3.6 10*3/uL (ref 1.5–6.5)
Neutrophils Relative %: 63 %
Platelets: 276 10*3/uL (ref 145–400)
RBC: 3.17 MIL/uL — ABNORMAL LOW (ref 3.70–5.45)
RDW: 16.2 % — ABNORMAL HIGH (ref 11.2–14.5)
WBC: 5.7 10*3/uL (ref 3.9–10.3)

## 2017-09-14 LAB — COMPREHENSIVE METABOLIC PANEL
ALK PHOS: 134 U/L (ref 40–150)
ALT: 26 U/L (ref 0–55)
ANION GAP: 10 (ref 3–11)
AST: 25 U/L (ref 5–34)
Albumin: 3.4 g/dL — ABNORMAL LOW (ref 3.5–5.0)
BILIRUBIN TOTAL: 0.4 mg/dL (ref 0.2–1.2)
BUN: 8 mg/dL (ref 7–26)
CALCIUM: 8.8 mg/dL (ref 8.4–10.4)
CO2: 26 mmol/L (ref 22–29)
Chloride: 105 mmol/L (ref 98–109)
Creatinine, Ser: 0.78 mg/dL (ref 0.60–1.10)
GFR calc Af Amer: >60 mL/min (ref >60)
GFR calc non Af Amer: >60 mL/min (ref >60)
Glucose, Bld: 159 mg/dL — ABNORMAL HIGH (ref 70–140)
POTASSIUM: 3.5 mmol/L (ref 3.5–5.1)
SODIUM: 141 mmol/L (ref 136–145)
TOTAL PROTEIN: 6.1 g/dL — AB (ref 6.4–8.3)

## 2017-09-14 MED ORDER — CARBOPLATIN CHEMO INJECTION 600 MG/60ML
701.4000 mg | Freq: Once | INTRAVENOUS | Status: AC
  Administered 2017-09-14: 700 mg via INTRAVENOUS
  Filled 2017-09-14: qty 70

## 2017-09-14 MED ORDER — DIPHENHYDRAMINE HCL 25 MG PO CAPS
ORAL_CAPSULE | ORAL | Status: AC
Start: 2017-09-14 — End: 2017-09-14
  Filled 2017-09-14: qty 2

## 2017-09-14 MED ORDER — SODIUM CHLORIDE 0.9 % IV SOLN
6.0000 mg/kg | Freq: Once | INTRAVENOUS | Status: AC
  Administered 2017-09-14: 378 mg via INTRAVENOUS
  Filled 2017-09-14: qty 18

## 2017-09-14 MED ORDER — ACETAMINOPHEN 325 MG PO TABS
ORAL_TABLET | ORAL | Status: AC
  Filled 2017-09-14: qty 2

## 2017-09-14 MED ORDER — FOSAPREPITANT DIMEGLUMINE INJECTION 150 MG
Freq: Once | INTRAVENOUS | Status: AC
  Administered 2017-09-14: 10:00:00 via INTRAVENOUS
  Filled 2017-09-14: qty 5

## 2017-09-14 MED ORDER — ACETAMINOPHEN 325 MG PO TABS
650.0000 mg | ORAL_TABLET | Freq: Once | ORAL | Status: AC
  Administered 2017-09-14: 650 mg via ORAL

## 2017-09-14 MED ORDER — SODIUM CHLORIDE 0.9% FLUSH
10.0000 mL | INTRAVENOUS | Status: DC | PRN
  Administered 2017-09-14: 10 mL
  Filled 2017-09-14: qty 10

## 2017-09-14 MED ORDER — DOXYCYCLINE HYCLATE 100 MG PO TABS: 100.0000 mg | ORAL_TABLET | Freq: Two times a day (BID) | ORAL | 0 refills | Status: DC

## 2017-09-14 MED ORDER — SODIUM CHLORIDE 0.9 % IV SOLN
420.0000 mg | Freq: Once | INTRAVENOUS | Status: AC
  Administered 2017-09-14: 420 mg via INTRAVENOUS
  Filled 2017-09-14: qty 14

## 2017-09-14 MED ORDER — HEPARIN SOD (PORK) LOCK FLUSH 100 UNIT/ML IV SOLN
500.0000 [IU] | Freq: Once | INTRAVENOUS | Status: AC | PRN
  Administered 2017-09-14: 500 [IU]
  Filled 2017-09-14: qty 5

## 2017-09-14 MED ORDER — DIPHENHYDRAMINE HCL 25 MG PO CAPS
50.0000 mg | ORAL_CAPSULE | Freq: Once | ORAL | Status: AC
  Administered 2017-09-14: 50 mg via ORAL

## 2017-09-14 MED ORDER — SODIUM CHLORIDE 0.9% FLUSH
10.0000 mL | INTRAVENOUS | Status: DC | PRN
  Administered 2017-09-14: 10 mL via INTRAVENOUS
  Filled 2017-09-14: qty 10

## 2017-09-14 MED ORDER — PALONOSETRON HCL INJECTION 0.25 MG/5ML
INTRAVENOUS | Status: AC
  Filled 2017-09-14: qty 5

## 2017-09-14 MED ORDER — PALONOSETRON HCL INJECTION 0.25 MG/5ML
0.2500 mg | Freq: Once | INTRAVENOUS | Status: AC
  Administered 2017-09-14: 0.25 mg via INTRAVENOUS

## 2017-09-14 MED ORDER — SODIUM CHLORIDE 0.9 % IV SOLN
Freq: Once | INTRAVENOUS | Status: AC
  Administered 2017-09-14: 10:00:00 via INTRAVENOUS

## 2017-09-14 NOTE — Assessment & Plan Note (Addendum)
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 Adjuvant TCH Perjeta followed by Herceptin and Perjeta maintenance for 1 year 2. followed by adjuvant antiestrogen therapy -------------------------------------------------------------------- Current treatment: Cycle 3 day1 TCH Perjeta (Taxotere discontinued from cycle 2 due to rash) Chemo Toxicities: 1.folliculitis   I suspect her hair may be coming back as the taxotere was discontinued, however I recommended warm compresses to the area BID, clindagel, and I refilled her Doxycycline.  I reviewed her labs with her today, CBC stable, CMET pending.  So long as her CMET is within parameters, she will proceed with chemotherapy today.  She verbalizes understanding.    She will return in 3 weeks for her next cycle.  I adjusted her appointments accordingly since she did miss her last appt.

## 2017-09-14 NOTE — Progress Notes (Signed)
[facility] Follow up:    Alexis Underwood, [place] [address]   DIAGNOSIS: Cancer Staging Malignant neoplasm of upper-outer quadrant of left breast in female, estrogen receptor positive ([place]) Staging form: Breast, AJCC 8th Edition - Clinical stage from 04/25/2017: Stage IA (cT1c, cN0, cM0, G2, ER: Positive, PR: Positive, HER2: Positive) - Unsigned   SUMMARY OF ONCOLOGIC HISTORY:   Malignant neoplasm of upper-outer quadrant of left breast in female, estrogen receptor positive ([name])   04/16/2017 Initial Diagnosis    Screening mammogram detected calcifications left breast UOQ 9.3 cm, at 2:00: 1.4 cm and at 1:00: 8 mm; biopsy of mass at 1:00: IDC grade 2 with DCIS ER 95%, PR 90%,Ki-67 45%, HER-2 positive ratio 5.18; iopsy of the 2:00 mass and calcifications were DCIS with suspicion for microinvasion, T1 cN0 stage IA clinical stage AJCC 8      05/09/2017 Genetic Testing    The patient had genetic testing due to a personal and family history of breast cancer.  The Common Hereditary Cancer Panel was ordered.  The Hereditary Gene Panel offered by [facility] includes sequencing and/or deletion duplication testing of the following 47 genes: APC, ATM, AXIN2, BARD1, BMPR1A, BRCA1, BRCA2, BRIP1, CDH1, CDKN2A (p14ARF), CDKN2A (p16INK4a), CKD4, CHEK2, CTNNA1, DICER1, EPCAM (Deletion/duplication testing only), GREM1 (promoter region deletion/duplication testing only), KIT, MEN1, MLH1, MSH2, MSH3, MSH6, MUTYH, NBN, NF1, NHTL1, PALB2, PDGFRA, PMS2, POLD1, POLE, PTEN, RAD50, RAD51C, RAD51D, SDHB, SDHC, SDHD, SMAD4, SMARCA4. STK11, TP53, TSC1, TSC2, and VHL.  The following genes were evaluated for sequence changes only: SDHA and HOXB13 c.251G>A variant only.  Results- No pathogenic variants identified.  A Variant of Uncertain Significance was identified in the gene APC c.-30626C>G (Promoter 1B).  The date of this test report is 05/09/2017.       06/20/2017  Surgery    Left mastectomy: Multifocal IDC with DCIS tumor size is 1.1, 1.6 and 2.4 cm, margins negative, 0/5 lymph nodes negative, ER 95%, PR 90%, HER-2 positive ratio 5.18, Ki-67 45% T2N0 stage Ib pathologic stage      07/24/2017 -  Chemotherapy    TCH P followed by Herceptin and Perjeta maintenance        CURRENT THERAPY: Cycle 3 adjuvant Carbo, Herceptin, Perjeta  INTERVAL HISTORY: Alexis Underwood 42 y.o. female returns for evaluation prior to receiving adjuvant chemotherapy.  She is doing well today.  She denies any issues today such as fever, chills, or any further concerns.  Her chemotherapy was delayed slightly due to viral gastroenteritis.    Patient Active Problem List   Diagnosis Date Noted  . Port-A-Cath in place 07/31/2017  . Breast cancer ([place]) 06/20/2017  . Genetic testing 05/21/2017  . Family history of breast cancer   . Family history of throat cancer   . Malignant neoplasm of upper-outer quadrant of left breast in female, estrogen receptor positive ([place]) 04/19/2017    has No Known Allergies.  MEDICAL HISTORY: Past Medical History:  Diagnosis Date  . Breast cancer ([place])   . Family history of breast cancer   . Family history of throat cancer   . History of kidney stones   . Hyperlipidemia   . SVD (spontaneous vaginal delivery)     SURGICAL HISTORY: Past Surgical History:  Procedure Laterality Date  . BREAST BIOPSY Left 04/18/2017   Malignant  . BREAST BIOPSY Right 06/11/2017  . BREAST RECONSTRUCTION WITH PLACEMENT OF TISSUE EXPANDER AND FLEX HD (ACELLULAR HYDRATED DERMIS) Left  06/20/2017   Procedure: LEFT BREAST RECONSTRUCTION WITH PLACEMENT OF TISSUE EXPANDER AND FLEX HD (ACELLULAR HYDRATED DERMIS);  Surgeon: Wallace Going, DO;  Location: [facility];  Service: Plastics;  Laterality: Left;  . COLONOSCOPY    . MASTECTOMY W/ SENTINEL NODE BIOPSY Left 06/20/2017  . MASTECTOMY W/ SENTINEL NODE BIOPSY Left 06/20/2017   Procedure: LEFT MASTECTOMY WITH  LEFT SENTINEL LYMPH NODE BIOPSY;  Surgeon: Jovita Kussmaul, MD;  Location: [facility];  Service: General;  Laterality: Left;  . PORTACATH PLACEMENT Right 06/20/2017   Procedure: INSERTION PORT-A-CATH;  Surgeon: Jovita Kussmaul, MD;  Location: [facility];  Service: General;  Laterality: Right;  . WISDOM TOOTH EXTRACTION      SOCIAL HISTORY: Social History   Socioeconomic History  . Marital status: Married    Spouse name: Not on file  . Number of children: Not on file  . Years of education: Not on file  . Highest education level: Not on file  Social Needs  . Financial resource strain: Not on file  . Food insecurity - worry: Not on file  . Food insecurity - inability: Not on file  . Transportation needs - medical: Not on file  . Transportation needs - non-medical: Not on file  Occupational History  . Not on file  Tobacco Use  . Smoking status: Never Smoker  . Smokeless tobacco: Never Used  Substance and Sexual Activity  . Alcohol use: No  . Drug use: No  . Sexual activity: Not on file  Other Topics Concern  . Not on file  Social History Narrative  . Not on file    FAMILY HISTORY: Family History  Problem Relation Age of Onset  . Heart attack Mother   . Hyperlipidemia Mother   . Hypertension Mother   . Heart failure Mother   . Breast cancer Paternal Grandmother 93  . Heart disease Maternal Grandfather        died in 6's  . Throat cancer Paternal Grandfather 44    Review of Systems  Constitutional: Negative for appetite change, chills, fatigue, fever and unexpected weight change.  HENT:   Negative for hearing loss, lump/mass and trouble swallowing.   Eyes: Negative for eye problems and icterus.  Respiratory: Negative for chest tightness, cough and shortness of breath.   Cardiovascular: Negative for chest pain, leg swelling and palpitations.  Gastrointestinal: Negative for abdominal distention, abdominal pain, constipation, diarrhea, nausea and vomiting.  Endocrine: Negative for  hot flashes.  Genitourinary: Negative for difficulty urinating.   Skin: Positive for rash (pustular lesions at base of hair follicles on scalp x 3-5 days, no warmth, no pain). Negative for itching.  Neurological: Negative for dizziness, extremity weakness and headaches.  Hematological: Negative for adenopathy. Does not bruise/bleed easily.  Psychiatric/Behavioral: Negative for depression. The patient is not nervous/anxious.       PHYSICAL EXAMINATION  ECOG PERFORMANCE STATUS: 1 - Symptomatic but completely ambulatory  Vitals:   09/14/17 0907  BP: (!) 142/93  Pulse: 81  Resp: 17  Temp: 98.2 F (36.8 C)  SpO2: 99%    Physical Exam  Constitutional: She is oriented to person, place, and time and well-developed, well-nourished, and in no distress.  HENT:  Head: Normocephalic and atraumatic.  Mouth/Throat: Oropharynx is clear and moist. No oropharyngeal exudate.  Eyes: Pupils are equal, round, and reactive to light. No scleral icterus.  Neck: Neck supple.  Cardiovascular: Normal rate, regular rhythm and normal heart sounds.  Pulmonary/Chest: Effort normal and breath sounds normal.  No respiratory distress. She has no wheezes. She has no rales.  Abdominal: Soft. Bowel sounds are normal. She exhibits no distension. There is no tenderness.  Lymphadenopathy:    She has no cervical adenopathy.  Neurological: She is alert and oriented to person, place, and time.  Skin: Skin is warm and dry.  Pustular lesions at base of hair follicles on her scalp  Psychiatric: Mood and affect normal.    LABORATORY DATA:  CBC    Component Value Date/Time   WBC 5.7 09/14/2017 0858   RBC 3.17 (L) 09/14/2017 0858   HGB 10.8 (L) 09/14/2017 0858   HGB 14.0 04/25/2017 1219   HCT 31.5 (L) 09/14/2017 0858   HCT 40.2 04/25/2017 1219   PLT 276 09/14/2017 0858   PLT 150 08/07/2017 0814   PLT 293 04/25/2017 1219   MCV 99.3 09/14/2017 0858   MCV 97.5 04/25/2017 1219   MCH 34.0 09/14/2017 0858   MCHC  34.2 09/14/2017 0858   RDW 16.2 (H) 09/14/2017 0858   RDW 12.4 04/25/2017 1219   LYMPHSABS 1.4 09/14/2017 0858   LYMPHSABS 2.3 04/25/2017 1219   MONOABS 0.5 09/14/2017 0858   MONOABS 0.9 04/25/2017 1219   EOSABS 0.1 09/14/2017 0858   EOSABS 0.1 04/25/2017 1219   BASOSABS 0.0 09/14/2017 0858   BASOSABS 0.0 04/25/2017 1219    CMP     Component Value Date/Time   NA 141 09/14/2017 0858   NA 140 04/25/2017 1219   K 3.5 09/14/2017 0858   K 3.7 04/25/2017 1219   CL 105 09/14/2017 0858   CO2 26 09/14/2017 0858   CO2 27 04/25/2017 1219   GLUCOSE 159 (H) 09/14/2017 0858   GLUCOSE 102 04/25/2017 1219   BUN 8 09/14/2017 0858   BUN 12.1 04/25/2017 1219   CREATININE 0.78 09/14/2017 0858   CREATININE 0.82 08/07/2017 0814   CREATININE 0.9 04/25/2017 1219   CALCIUM 8.8 09/14/2017 0858   CALCIUM 9.1 04/25/2017 1219   PROT 6.1 (L) 09/14/2017 0858   PROT 7.2 04/25/2017 1219   ALBUMIN 3.4 (L) 09/14/2017 0858   ALBUMIN 4.2 04/25/2017 1219   AST 25 09/14/2017 0858   AST 15 08/07/2017 0814   AST 15 04/25/2017 1219   ALT 26 09/14/2017 0858   ALT 29 08/07/2017 0814   ALT 16 04/25/2017 1219   ALKPHOS 134 09/14/2017 0858   ALKPHOS 81 04/25/2017 1219   BILITOT 0.4 09/14/2017 0858   BILITOT 0.2 08/07/2017 0814   BILITOT 0.42 04/25/2017 1219   GFRNONAA >60 09/14/2017 0858   GFRNONAA >60 08/07/2017 0814   GFRAA >60 09/14/2017 0858   GFRAA >60 08/07/2017 0814         ASSESSMENT and PLAN:   Malignant neoplasm of upper-outer quadrant of left breast in female, estrogen receptor positive ([place]) 06/20/2017 left mastectomy: 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 Adjuvant TCH Perjeta followed by Herceptin and Perjeta maintenance for 1 year 2. followed by adjuvant antiestrogen  therapy -------------------------------------------------------------------- Current treatment: Cycle 3 day1 TCH Perjeta (Taxotere discontinued from cycle 2 due to rash) Chemo Toxicities: 1.folliculitis   I suspect her hair may be coming back as the taxotere was discontinued, however I recommended warm compresses to the area BID, clindagel, and I refilled her Doxycycline.  I reviewed her labs with her today, CBC stable, CMET pending.  So  long as her CMET is within parameters, she will proceed with chemotherapy today.  She verbalizes understanding.    She will return in 3 weeks for her next cycle.  I adjusted her appointments accordingly since she did miss her last appt.       All questions were answered. The patient knows to call the clinic with any problems, questions or concerns. We can certainly see the patient much sooner if necessary.  A total of (30) minutes of face-to-face time was spent with this patient with greater than 50% of that time in counseling and care-coordination.  This note was electronically signed. Scot Dock, NP 09/14/2017

## 2017-09-14 NOTE — Patient Instructions (Signed)
Implanted Port Home Guide An implanted port is a type of central line that is placed under the skin. Central lines are used to provide IV access when treatment or nutrition needs to be given through a person's veins. Implanted ports are used for long-term IV access. An implanted port may be placed because:  You need IV medicine that would be irritating to the small veins in your hands or arms.  You need long-term IV medicines, such as antibiotics.  You need IV nutrition for a long period.  You need frequent blood draws for lab tests.  You need dialysis.  Implanted ports are usually placed in the chest area, but they can also be placed in the upper arm, the abdomen, or the leg. An implanted port has two main parts:  Reservoir. The reservoir is round and will appear as a small, raised area under your skin. The reservoir is the part where a needle is inserted to give medicines or draw blood.  Catheter. The catheter is a thin, flexible tube that extends from the reservoir. The catheter is placed into a large vein. Medicine that is inserted into the reservoir goes into the catheter and then into the vein.  How will I care for my incision site? Do not get the incision site wet. Bathe or shower as directed by your health care provider. How is my port accessed? Special steps must be taken to access the port:  Before the port is accessed, a numbing cream can be placed on the skin. This helps numb the skin over the port site.  Your health care provider uses a sterile technique to access the port. ? Your health care provider must put on a mask and sterile gloves. ? The skin over your port is cleaned carefully with an antiseptic and allowed to dry. ? The port is gently pinched between sterile gloves, and a needle is inserted into the port.  Only "non-coring" port needles should be used to access the port. Once the port is accessed, a blood return should be checked. This helps ensure that the port  is in the vein and is not clogged.  If your port needs to remain accessed for a constant infusion, a clear (transparent) bandage will be placed over the needle site. The bandage and needle will need to be changed every week, or as directed by your health care provider.  Keep the bandage covering the needle clean and dry. Do not get it wet. Follow your health care provider's instructions on how to take a shower or bath while the port is accessed.  If your port does not need to stay accessed, no bandage is needed over the port.  What is flushing? Flushing helps keep the port from getting clogged. Follow your health care provider's instructions on how and when to flush the port. Ports are usually flushed with saline solution or a medicine called heparin. The need for flushing will depend on how the port is used.  If the port is used for intermittent medicines or blood draws, the port will need to be flushed: ? After medicines have been given. ? After blood has been drawn. ? As part of routine maintenance.  If a constant infusion is running, the port may not need to be flushed.  How long will my port stay implanted? The port can stay in for as long as your health care provider thinks it is needed. When it is time for the port to come out, surgery will be   done to remove it. The procedure is similar to the one performed when the port was put in. When should I seek immediate medical care? When you have an implanted port, you should seek immediate medical care if:  You notice a bad smell coming from the incision site.  You have swelling, redness, or drainage at the incision site.  You have more swelling or pain at the port site or the surrounding area.  You have a fever that is not controlled with medicine.  This information is not intended to replace advice given to you by your health care provider. Make sure you discuss any questions you have with your health care provider. Document  Released: 06/26/2005 Document Revised: 12/02/2015 Document Reviewed: 03/03/2013 Elsevier Interactive Patient Education  2017 [employer] Inc[employer]  

## 2017-09-14 NOTE — Patient Instructions (Signed)
[facility] Discharge Instructions for Patients Receiving Chemotherapy  Today you received the following chemotherapy agents Herceptin, Perjeta, and Carboplatin.   To help prevent nausea and vomiting after your treatment, we encourage you to take your nausea medication as directed.   If you develop nausea and vomiting that is not controlled by your nausea medication, call the clinic.   BELOW ARE SYMPTOMS THAT SHOULD BE REPORTED IMMEDIATELY:  *FEVER GREATER THAN 100.5 F  *CHILLS WITH OR WITHOUT FEVER  NAUSEA AND VOMITING THAT IS NOT CONTROLLED WITH YOUR NAUSEA MEDICATION  *UNUSUAL SHORTNESS OF BREATH  *UNUSUAL BRUISING OR BLEEDING  TENDERNESS IN MOUTH AND THROAT WITH OR WITHOUT PRESENCE OF ULCERS  *URINARY PROBLEMS  *BOWEL PROBLEMS  UNUSUAL RASH Items with * indicate a potential emergency and should be followed up as soon as possible.  Feel free to call the clinic should you have any questions or concerns. The clinic phone number is [phone].  Please show the [facility] at check-in to the Emergency Department and triage nurse.

## 2017-09-17 ENCOUNTER — Telehealth: Payer: Self-pay

## 2017-09-17 NOTE — Telephone Encounter (Signed)
Returned pt call regarding not having an appt one week after chemotherapy like she typically does. Scheduled her with labs and Dr. Lindi Adie. Dates and times went over with pt.  Cyndia Bent RN

## 2017-09-20 ENCOUNTER — Telehealth: Payer: Self-pay | Admitting: Hematology and Oncology

## 2017-09-20 NOTE — Telephone Encounter (Signed)
11:17 am spoke with patient to confirm FMLA paperwork was faxed to [facility] @ [phone].  Patient requested a copy be left at front desk reception area for pick up.

## 2017-09-21 ENCOUNTER — Inpatient Hospital Stay: Payer: BLUE CROSS/BLUE SHIELD

## 2017-09-21 ENCOUNTER — Inpatient Hospital Stay (HOSPITAL_BASED_OUTPATIENT_CLINIC_OR_DEPARTMENT_OTHER): Payer: BLUE CROSS/BLUE SHIELD | Admitting: Hematology and Oncology

## 2017-09-21 DIAGNOSIS — C50412 Malignant neoplasm of upper-outer quadrant of left female breast: Secondary | ICD-10-CM | POA: Diagnosis not present

## 2017-09-21 DIAGNOSIS — Z170 Estrogen receptor positive status [ER+]: Principal | ICD-10-CM

## 2017-09-21 DIAGNOSIS — R530 Neoplastic (malignant) related fatigue: Secondary | ICD-10-CM | POA: Diagnosis not present

## 2017-09-21 DIAGNOSIS — Z5111 Encounter for antineoplastic chemotherapy: Secondary | ICD-10-CM | POA: Diagnosis not present

## 2017-09-21 DIAGNOSIS — Z95828 Presence of other vascular implants and grafts: Secondary | ICD-10-CM

## 2017-09-21 LAB — CBC WITH DIFFERENTIAL (CANCER CENTER ONLY)
Basophils Absolute: 0 10*3/uL (ref 0.0–0.1)
Basophils Relative: 1 %
EOS ABS: 0.1 10*3/uL (ref 0.0–0.5)
Eosinophils Relative: 1 %
HCT: 32.2 % — ABNORMAL LOW (ref 34.8–46.6)
HEMOGLOBIN: 11.3 g/dL — AB (ref 11.6–15.9)
LYMPHS ABS: 1.2 10*3/uL (ref 0.9–3.3)
Lymphocytes Relative: 24 %
MCH: 34.8 pg — AB (ref 25.1–34.0)
MCHC: 35 g/dL (ref 31.5–36.0)
MCV: 99.6 fL (ref 79.5–101.0)
Monocytes Absolute: 0.4 10*3/uL (ref 0.1–0.9)
Monocytes Relative: 7 %
NEUTROS PCT: 67 %
Neutro Abs: 3.5 10*3/uL (ref 1.5–6.5)
Platelet Count: 262 10*3/uL (ref 145–400)
RBC: 3.24 MIL/uL — AB (ref 3.70–5.45)
RDW: 16.1 % — ABNORMAL HIGH (ref 11.2–14.5)
WBC: 5.2 10*3/uL (ref 3.9–10.3)

## 2017-09-21 LAB — CMP (CANCER CENTER ONLY)
ALT: 19 U/L (ref 0–55)
ANION GAP: 8 (ref 3–11)
AST: 17 U/L (ref 5–34)
Albumin: 3.7 g/dL (ref 3.5–5.0)
Alkaline Phosphatase: 113 U/L (ref 40–150)
BUN: 15 mg/dL (ref 7–26)
CHLORIDE: 106 mmol/L (ref 98–109)
CO2: 24 mmol/L (ref 22–29)
CREATININE: 0.8 mg/dL (ref 0.60–1.10)
Calcium: 8.9 mg/dL (ref 8.4–10.4)
Glucose, Bld: 160 mg/dL — ABNORMAL HIGH (ref 70–140)
Potassium: 3.7 mmol/L (ref 3.5–5.1)
Sodium: 138 mmol/L (ref 136–145)
Total Bilirubin: 0.4 mg/dL (ref 0.2–1.2)
Total Protein: 6.7 g/dL (ref 6.4–8.3)

## 2017-09-21 MED ORDER — HEPARIN SOD (PORK) LOCK FLUSH 100 UNIT/ML IV SOLN
500.0000 [IU] | Freq: Once | INTRAVENOUS | Status: AC | PRN
  Administered 2017-09-21: 500 [IU] via INTRAVENOUS
  Filled 2017-09-21: qty 5

## 2017-09-21 MED ORDER — SODIUM CHLORIDE 0.9% FLUSH
10.0000 mL | INTRAVENOUS | Status: DC | PRN
  Administered 2017-09-21: 10 mL via INTRAVENOUS
  Filled 2017-09-21: qty 10

## 2017-09-21 NOTE — Assessment & Plan Note (Signed)
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 Adjuvant TCH Perjeta followed by Herceptin and Perjeta maintenance for 1 year 2. followed by adjuvant antiestrogen therapy -------------------------------------------------------------------- Current treatment: [place] Perjeta(Taxotere discontinued from cycle 2 due to rash) Chemo Toxicities: 1.folliculitis   2. fatigue Monitoring closely for toxicities Labs have been reviewed  Return to clinic every 3 weeks for chemo.

## 2017-09-21 NOTE — Progress Notes (Signed)
Patient Care Team: Carol Ada, MD as PCP - General (Family Medicine)  DIAGNOSIS:  Encounter Diagnosis  Name Primary?  . Malignant neoplasm of upper-outer quadrant of left breast in female, estrogen receptor positive ([place])     SUMMARY OF ONCOLOGIC HISTORY:   Malignant neoplasm of upper-outer quadrant of left breast in female, estrogen receptor positive ([place])   04/16/2017 Initial Diagnosis    Screening mammogram detected calcifications left breast UOQ 9.3 cm, at 2:00: 1.4 cm and at 1:00: 8 mm; biopsy of mass at 1:00: IDC grade 2 with DCIS ER 95%, PR 90%,Ki-67 45%, HER-2 positive ratio 5.18; iopsy of the 2:00 mass and calcifications were DCIS with suspicion for microinvasion, T1 cN0 stage IA clinical stage AJCC 8      05/09/2017 Genetic Testing    The patient had genetic testing due to a personal and family history of breast cancer.  The Common Hereditary Cancer Panel was ordered.  The Hereditary Gene Panel offered by [facility] includes sequencing and/or deletion duplication testing of the following 47 genes: APC, ATM, AXIN2, BARD1, BMPR1A, BRCA1, BRCA2, BRIP1, CDH1, CDKN2A (p14ARF), CDKN2A (p16INK4a), CKD4, CHEK2, CTNNA1, DICER1, EPCAM (Deletion/duplication testing only), GREM1 (promoter region deletion/duplication testing only), KIT, MEN1, MLH1, MSH2, MSH3, MSH6, MUTYH, NBN, NF1, NHTL1, PALB2, PDGFRA, PMS2, POLD1, POLE, PTEN, RAD50, RAD51C, RAD51D, SDHB, SDHC, SDHD, SMAD4, SMARCA4. STK11, TP53, TSC1, TSC2, and VHL.  The following genes were evaluated for sequence changes only: SDHA and HOXB13 c.251G>A variant only.  Results- No pathogenic variants identified.  A Variant of Uncertain Significance was identified in the gene APC c.-30626C>G (Promoter 1B).  The date of this test report is 05/09/2017.       06/20/2017 Surgery    Left mastectomy: Multifocal IDC with DCIS tumor size is 1.1, 1.6 and 2.4 cm, margins negative, 0/5 lymph nodes negative, ER 95%, PR 90%, HER-2 positive ratio 5.18,  Ki-67 45% T2N0 stage Ib pathologic stage      07/24/2017 -  Chemotherapy    TCH P followed by Herceptin and Perjeta maintenance        CHIEF COMPLIANT: Follow-up after last cycle of chemotherapy  INTERVAL HISTORY: Alexis Underwood is a 42 year old with above-mentioned history of left breast cancer treated with mastectomy and is currently in adjuvant chemotherapy with [place].  She had a bad reaction to Taxotere and we discontinue it.  Since then she did not have any further rashes.  She is tolerating the treatment extremely well.  Denies any nausea vomiting.  She does have mild to moderate fatigue.  She is hoping to go back to work for a few hours every day.  REVIEW OF SYSTEMS:   Constitutional: Denies fevers, chills or abnormal weight loss Eyes: Denies blurriness of vision Ears, nose, mouth, throat, and face: Denies mucositis or sore throat Respiratory: Denies cough, dyspnea or wheezes Cardiovascular: Denies palpitation, chest discomfort Gastrointestinal:  Denies nausea, heartburn or change in bowel habits Skin: Denies abnormal skin rashes Lymphatics: Denies new lymphadenopathy or easy bruising Neurological:Denies numbness, tingling or new weaknesses Behavioral/Psych: Mood is stable, no new changes  Extremities: No lower extremity edema  All other systems were reviewed with the patient and are negative.  I have reviewed the past medical history, past surgical history, social history and family history with the patient and they are unchanged from previous note.  ALLERGIES:  has No Known Allergies.  MEDICATIONS:  Current Outpatient Medications  Medication Sig Dispense Refill  . acetaminophen (TYLENOL) 325 MG tablet Take 650 mg by  mouth daily as needed for headache.    . clindamycin (CLINDAGEL) 1 % gel Apply topically 2 (two) times daily. 30 g 0  . dextromethorphan-guaiFENesin (MUCINEX DM) 30-600 MG 12hr tablet Take 1 tablet by mouth 2 (two) times daily as needed for cough.      . doxycycline (VIBRA-TABS) 100 MG tablet Take 1 tablet (100 mg total) by mouth 2 (two) times daily. 20 tablet 0  . lidocaine-prilocaine (EMLA) cream Apply 1 application topically daily as needed (port access).     . LORazepam (ATIVAN) 0.5 MG tablet Take 1 tablet (0.5 mg total) by mouth at bedtime as needed for sleep. 30 tablet 0  . lovastatin (MEVACOR) 40 MG tablet Take 40 mg by mouth every evening.    . ondansetron (ZOFRAN) 8 MG tablet Take 1 tablet (8 mg total) by mouth 2 (two) times daily as needed (Nausea or vomiting). Begin 4 days after chemotherapy. 30 tablet 1  . pantoprazole (PROTONIX) 40 MG tablet Take 1 tablet (40 mg total) by mouth daily. 30 tablet 3  . prochlorperazine (COMPAZINE) 10 MG tablet Take 1 tablet (10 mg total) by mouth every 6 (six) hours as needed (Nausea or vomiting). 30 tablet 1  . tamoxifen (NOLVADEX) 20 MG tablet Take 1 tablet (20 mg total) by mouth daily. 30 tablet 0  . triamcinolone ointment (KENALOG) 0.5 % Apply 1 application topically 2 (two) times daily. 30 g 0   No current facility-administered medications for this visit.     PHYSICAL EXAMINATION: ECOG PERFORMANCE STATUS: 1 - Symptomatic but completely ambulatory  Vitals:   09/21/17 0934  BP: (!) 132/57  Pulse: 95  Resp: 18  Temp: 98.7 F (37.1 C)  SpO2: 100%   Filed Weights   09/21/17 0934  Weight: 139 lb 11.2 oz (63.4 kg)    GENERAL:alert, no distress and comfortable SKIN: skin color, texture, turgor are normal, no rashes or significant lesions EYES: normal, Conjunctiva are pink and non-injected, sclera clear OROPHARYNX:no exudate, no erythema and lips, buccal mucosa, and tongue normal  NECK: supple, thyroid normal size, non-tender, without nodularity LYMPH:  no palpable lymphadenopathy in the cervical, axillary or inguinal LUNGS: clear to auscultation and percussion with normal breathing effort HEART: regular rate & rhythm and no murmurs and no lower extremity edema ABDOMEN:abdomen soft,  non-tender and normal bowel sounds MUSCULOSKELETAL:no cyanosis of digits and no clubbing  NEURO: alert & oriented x 3 with fluent speech, no focal motor/sensory deficits EXTREMITIES: No lower extremity edema  LABORATORY DATA:  I have reviewed the data as listed CMP Latest Ref Rng & Units 09/14/2017 08/21/2017 08/14/2017  Glucose 70 - 140 mg/dL 159(H) 123 118  BUN 7 - 26 mg/dL _0 Creatinine 0.60 - 1.10 mg/dL 0.78 0.73 0.68  Sodium 136 - 145 mmol/L 141 139 142  Potassium 3.5 - 5.1 mmol/L 3.5 3.6 3.6  Chloride 98 - 109 mmol/L 105 103 106  CO2 22 - 29 mmol/L _1 Calcium 8.4 - 10.4 mg/dL 8.8 8.6 8.4  Total Protein 6.4 - 8.3 g/dL 6.1(L) 6.5 6.1(L)  Total Bilirubin 0.2 - 1.2 mg/dL 0.4 0.3 0.2  Alkaline Phos 40 - 150 U/L 134 101 104  AST 5 - 34 U/L _2 ALT 0 - 55 U/L _3 Lab Results  Component Value Date   WBC 5.2 09/21/2017   HGB 10.8 (L) 09/14/2017   HCT 32.2 (L) 09/21/2017   MCV 99.6 09/21/2017   PLT  262 09/21/2017   NEUTROABS 3.5 09/21/2017    ASSESSMENT & PLAN:  Malignant neoplasm of upper-outer quadrant of left breast in female, estrogen receptor positive ([place]) 06/20/2017 left mastectomy: 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 Adjuvant TCH Perjeta followed by Herceptin and Perjeta maintenance for 1 year 2. followed by adjuvant antiestrogen therapy -------------------------------------------------------------------- Current treatment: [place] Perjeta(Taxotere discontinued from cycle 2 due to rash) Chemo Toxicities: 1.folliculitis   2. fatigue Monitoring closely for toxicities Labs have been reviewed  Return to clinic every 3 weeks for chemo.   I spent 25 minutes talking to the patient of which more than half was spent in counseling and coordination of care.  No orders  of the defined types were placed in this encounter.  The patient has a good understanding of the overall plan. she agrees with it. she will call with any problems that may develop before the next visit here.   Harriette Ohara, MD 09/21/17

## 2017-09-24 ENCOUNTER — Telehealth: Payer: Self-pay | Admitting: Hematology and Oncology

## 2017-09-24 NOTE — Telephone Encounter (Signed)
Per 3/15 no los 

## 2017-09-25 ENCOUNTER — Other Ambulatory Visit: Payer: BLUE CROSS/BLUE SHIELD

## 2017-09-25 ENCOUNTER — Ambulatory Visit: Payer: BLUE CROSS/BLUE SHIELD | Admitting: Hematology and Oncology

## 2017-09-25 ENCOUNTER — Ambulatory Visit: Payer: BLUE CROSS/BLUE SHIELD

## 2017-10-01 ENCOUNTER — Telehealth: Payer: Self-pay

## 2017-10-01 ENCOUNTER — Encounter: Payer: Self-pay | Admitting: Adult Health

## 2017-10-01 ENCOUNTER — Inpatient Hospital Stay (HOSPITAL_BASED_OUTPATIENT_CLINIC_OR_DEPARTMENT_OTHER): Payer: BLUE CROSS/BLUE SHIELD | Admitting: Adult Health

## 2017-10-01 VITALS — BP 142/75 | HR 110 | Temp 98.2°F | Resp 18 | Ht 61.0 in | Wt 140.1 lb

## 2017-10-01 DIAGNOSIS — Z170 Estrogen receptor positive status [ER+]: Secondary | ICD-10-CM | POA: Diagnosis not present

## 2017-10-01 DIAGNOSIS — N920 Excessive and frequent menstruation with regular cycle: Secondary | ICD-10-CM

## 2017-10-01 DIAGNOSIS — M549 Dorsalgia, unspecified: Secondary | ICD-10-CM

## 2017-10-01 DIAGNOSIS — Z803 Family history of malignant neoplasm of breast: Secondary | ICD-10-CM | POA: Diagnosis not present

## 2017-10-01 DIAGNOSIS — Z808 Family history of malignant neoplasm of other organs or systems: Secondary | ICD-10-CM

## 2017-10-01 DIAGNOSIS — R5383 Other fatigue: Secondary | ICD-10-CM | POA: Diagnosis not present

## 2017-10-01 DIAGNOSIS — R102 Pelvic and perineal pain: Secondary | ICD-10-CM

## 2017-10-01 DIAGNOSIS — R399 Unspecified symptoms and signs involving the genitourinary system: Secondary | ICD-10-CM

## 2017-10-01 DIAGNOSIS — C50412 Malignant neoplasm of upper-outer quadrant of left female breast: Secondary | ICD-10-CM

## 2017-10-01 DIAGNOSIS — Z5111 Encounter for antineoplastic chemotherapy: Secondary | ICD-10-CM | POA: Diagnosis not present

## 2017-10-01 NOTE — Telephone Encounter (Signed)
Pt left VM regarding having low abd pain and pelvic pain and would like to see NP or Dr Lindi Adie today.  Returned pt's call, denies fever.  Per pt request, made pt appt for 1:30 pm today with Wilber Bihari NP - will route note to her.

## 2017-10-01 NOTE — Patient Instructions (Signed)
Goserelin injection What is this medicine? GOSERELIN (GOE se rel in) is similar to a hormone found in the body. It lowers the amount of sex hormones that the body makes. Men will have lower testosterone levels and women will have lower estrogen levels while taking this medicine. In men, this medicine is used to treat prostate cancer; the injection is either given once per month or once every 12 weeks. A once per month injection (only) is used to treat women with endometriosis, dysfunctional uterine bleeding, or advanced breast cancer. This medicine may be used for other purposes; ask your health care provider or pharmacist if you have questions. COMMON BRAND NAME(S): Zoladex What should I tell my health care provider before I take this medicine? They need to know if you have any of these conditions (some only apply to women): -diabetes -heart disease or previous heart attack -high blood pressure -high cholesterol -kidney disease -osteoporosis or low bone density -problems passing urine -spinal cord injury -stroke -tobacco smoker -an unusual or allergic reaction to goserelin, hormone therapy, other medicines, foods, dyes, or preservatives -pregnant or trying to get pregnant -breast-feeding How should I use this medicine? This medicine is for injection under the skin. It is given by a health care professional in a hospital or clinic setting. Men receive this injection once every 4 weeks or once every 12 weeks. Women will only receive the once every 4 weeks injection. Talk to your pediatrician regarding the use of this medicine in children. Special care may be needed. Overdosage: If you think you have taken too much of this medicine contact a poison control center or emergency room at once. NOTE: This medicine is only for you. Do not share this medicine with others. What if I miss a dose? It is important not to miss your dose. Call your doctor or health care professional if you are unable to  keep an appointment. What may interact with this medicine? -female hormones like estrogen -herbal or dietary supplements like black cohosh, chasteberry, or DHEA -female hormones like testosterone -prasterone This list may not describe all possible interactions. Give your health care provider a list of all the medicines, herbs, non-prescription drugs, or dietary supplements you use. Also tell them if you smoke, drink alcohol, or use illegal drugs. Some items may interact with your medicine. What should I watch for while using this medicine? Visit your doctor or health care professional for regular checks on your progress. Your symptoms may appear to get worse during the first weeks of this therapy. Tell your doctor or healthcare professional if your symptoms do not start to get better or if they get worse after this time. Your bones may get weaker if you take this medicine for a long time. If you smoke or frequently drink alcohol you may increase your risk of bone loss. A family history of osteoporosis, chronic use of drugs for seizures (convulsions), or corticosteroids can also increase your risk of bone loss. Talk to your doctor about how to keep your bones strong. This medicine should stop regular monthly menstration in women. Tell your doctor if you continue to menstrate. Women should not become pregnant while taking this medicine or for 12 weeks after stopping this medicine. Women should inform their doctor if they wish to become pregnant or think they might be pregnant. There is a potential for serious side effects to an unborn child. Talk to your health care professional or pharmacist for more information. Do not breast-feed an infant while taking   this medicine. Men should inform their doctors if they wish to father a child. This medicine may lower sperm counts. Talk to your health care professional or pharmacist for more information. What side effects may I notice from receiving this  medicine? Side effects that you should report to your doctor or health care professional as soon as possible: -allergic reactions like skin rash, itching or hives, swelling of the face, lips, or tongue -bone pain -breathing problems -changes in vision -chest pain -feeling faint or lightheaded, falls -fever, chills -pain, swelling, warmth in the leg -pain, tingling, numbness in the hands or feet -signs and symptoms of low blood pressure like dizziness; feeling faint or lightheaded, falls; unusually weak or tired -stomach pain -swelling of the ankles, feet, hands -trouble passing urine or change in the amount of urine -unusually high or low blood pressure -unusually weak or tired Side effects that usually do not require medical attention (report to your doctor or health care professional if they continue or are bothersome): -change in sex drive or performance -changes in breast size in both males and females -changes in emotions or moods -headache -hot flashes -irritation at site where injected -loss of appetite -skin problems like acne, dry skin -vaginal dryness This list may not describe all possible side effects. Call your doctor for medical advice about side effects. You may report side effects to FDA at 1-800-FDA-1088. Where should I keep my medicine? This drug is given in a hospital or clinic and will not be stored at home. NOTE: This sheet is a summary. It may not cover all possible information. If you have questions about this medicine, talk to your doctor, pharmacist, or health care provider.  2018 Elsevier/Gold Standard (2013-09-02 11:10:35)  

## 2017-10-01 NOTE — Assessment & Plan Note (Signed)
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 Adjuvant TCH Perjeta followed by Herceptin and Perjeta maintenance for 1 year 2. followed by adjuvant antiestrogen therapy -------------------------------------------------------------------- Current treatment: TCHP   Alexis Underwood has had her first menses since starting chemotherapy.  This was heavier and more painful than usual, likely because she has skipped a period.  She and I reviewed that while she is finishing up her cycle, it will be challenging to evaluate.  I encouraged to manage her pain with motrin and to monitor her pain.  I reviewed Zoladex with her, which will help with this, by putting her ovaries to sleep.  I reviewed that most women will have one cycle after the shot, and then will stop having cycles.  I reviewed side effects of the injection.    Alexis Underwood will monitor her symptoms, and return in 1 week to review with Dr. Lindi Adie.  They will also discuss Zoladex.  I gave her a handout on Zoladex in her avs.

## 2017-10-01 NOTE — Progress Notes (Signed)
[facility] Follow up:    Alexis Underwood, [place] [id number]   DIAGNOSIS: Cancer Staging Malignant neoplasm of upper-outer quadrant of left breast in female, estrogen receptor positive ([place]) Staging form: Breast, AJCC 8th Edition - Clinical stage from 04/25/2017: Stage IA (cT1c, cN0, cM0, G2, ER: Positive, PR: Positive, HER2: Positive) - Unsigned   SUMMARY OF ONCOLOGIC HISTORY:   Malignant neoplasm of upper-outer quadrant of left breast in female, estrogen receptor positive ([place])   04/16/2017 Initial Diagnosis    Screening mammogram detected calcifications left breast UOQ 9.3 cm, at 2:00: 1.4 cm and at 1:00: 8 mm; biopsy of mass at 1:00: IDC grade 2 with DCIS ER 95%, PR 90%,Ki-67 45%, HER-2 positive ratio 5.18; iopsy of the 2:00 mass and calcifications were DCIS with suspicion for microinvasion, T1 cN0 stage IA clinical stage AJCC 8      05/09/2017 Genetic Testing    The patient had genetic testing due to a personal and family history of breast cancer.  The Common Hereditary Cancer Panel was ordered.  The Hereditary Gene Panel offered by [facility] includes sequencing and/or deletion duplication testing of the following 47 genes: APC, ATM, AXIN2, BARD1, BMPR1A, BRCA1, BRCA2, BRIP1, CDH1, CDKN2A (p14ARF), CDKN2A (p16INK4a), CKD4, CHEK2, CTNNA1, DICER1, EPCAM (Deletion/duplication testing only), GREM1 (promoter region deletion/duplication testing only), KIT, MEN1, MLH1, MSH2, MSH3, MSH6, MUTYH, NBN, NF1, NHTL1, PALB2, PDGFRA, PMS2, POLD1, POLE, PTEN, RAD50, RAD51C, RAD51D, SDHB, SDHC, SDHD, SMAD4, SMARCA4. STK11, TP53, TSC1, TSC2, and VHL.  The following genes were evaluated for sequence changes only: SDHA and HOXB13 c.251G>A variant only.  Results- No pathogenic variants identified.  A Variant of Uncertain Significance was identified in the gene APC c.-[id number]>G (Promoter 1B).  The date of this test report is 05/09/2017.       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 female returns for evaluation of pelvic pain.  This pain has been present for about 6 days.  This is when she started her menstrual cycle.  The pain is described as a cramping pain in her pelvis, but also pain in her back.  The pain in her back does not feel cramping.  This pain is intermittent and has no specific pattern, thought it typically occurs later in the day or at night.  She says that it feels like a kidney stone that she previously had  In regard to her menses.  Historically they have been regular.  She started chemotherapy on 07/24/17.  She had a cycle a week prior to chemotherapy starting, and it was normal.  Normal for Alexis Underwood is 5-7 days long, 3-4 pads the first two days, then tapers down after that.  Cramping or associated pain is atypical for her periods.  After chemotherapy started, she did not have a cycle until last Tuesday.  She describes this as pad count of 5-6 for the first two days, it is becoming less now, though she is still experiencing it.     Patient Active Problem List   Diagnosis Date  Noted  . Port-A-Cath in place 07/31/2017  . Breast cancer ([place]) 06/20/2017  . Genetic testing 05/21/2017  . Family history of breast cancer   . Family history of throat cancer   . Malignant neoplasm of upper-outer quadrant of left breast in female, estrogen receptor positive ([place]) 04/19/2017    has No Known Allergies.  MEDICAL HISTORY: Past Medical History:  Diagnosis Date  . Breast cancer ([place])   . Family history of breast cancer   . Family history of throat cancer   . History of kidney stones    . Hyperlipidemia   . SVD (spontaneous vaginal delivery)     SURGICAL HISTORY: Past Surgical History:  Procedure Laterality Date  . BREAST BIOPSY Left 04/18/2017   Malignant  . BREAST BIOPSY Right 06/11/2017  . BREAST RECONSTRUCTION WITH PLACEMENT OF TISSUE EXPANDER AND FLEX HD (ACELLULAR HYDRATED DERMIS) Left 06/20/2017   Procedure: LEFT BREAST RECONSTRUCTION WITH PLACEMENT OF TISSUE EXPANDER AND FLEX HD (ACELLULAR HYDRATED DERMIS);  Surgeon: Wallace Going, DO;  Location: [facility];  Service: Plastics;  Laterality: Left;  . COLONOSCOPY    . MASTECTOMY W/ SENTINEL NODE BIOPSY Left 06/20/2017  . MASTECTOMY W/ SENTINEL NODE BIOPSY Left 06/20/2017   Procedure: LEFT MASTECTOMY WITH LEFT SENTINEL LYMPH NODE BIOPSY;  Surgeon: Jovita Kussmaul, MD;  Location: [facility];  Service: General;  Laterality: Left;  . PORTACATH PLACEMENT Right 06/20/2017   Procedure: INSERTION PORT-A-CATH;  Surgeon: Jovita Kussmaul, MD;  Location: [facility];  Service: General;  Laterality: Right;  . WISDOM TOOTH EXTRACTION      SOCIAL HISTORY: Social History   Socioeconomic History  . Marital status: Married    Spouse name: Not on file  . Number of children: Not on file  . Years of education: Not on file  . Highest education level: Not on file  Occupational History  . Not on file  Social Needs  . Financial resource strain: Not on file  . Food insecurity:    Worry: Not on file    Inability: Not on file  . Transportation needs:    Medical: Not on file    Non-medical: Not on file  Tobacco Use  . Smoking status: Never Smoker  . Smokeless tobacco: Never Used  Substance and Sexual Activity  . Alcohol use: No  . Drug use: No  . Sexual activity: Not on file  Lifestyle  . Physical activity:    Days per week: Not on file    Minutes per session: Not on file  . Stress: Not on file  Relationships  . Social connections:    Talks on phone: Not on file    Gets together: Not on file    Attends religious service: Not  on file    Active member of club or organization: Not on file    Attends meetings of clubs or organizations: Not on file    Relationship status: Not on file  . Intimate partner violence:    Fear of current or ex partner: Not on file    Emotionally abused: Not on file    Physically abused: Not on file    Forced sexual activity: Not on file  Other Topics Concern  . Not on file  Social History Narrative  . Not on file    FAMILY HISTORY: Family History  Problem Relation Age of Onset  . Heart attack Mother   . Hyperlipidemia Mother   . Hypertension Mother   . Heart failure Mother   .  Breast cancer Paternal Grandmother 47  . Heart disease Maternal Grandfather        died in 36's  . Throat cancer Paternal Grandfather 31    Review of Systems  Constitutional: Positive for fatigue. Negative for appetite change, chills, fever and unexpected weight change.  HENT:   Negative for hearing loss.   Eyes: Negative for eye problems and icterus.  Respiratory: Negative for chest tightness and cough.   Cardiovascular: Negative for chest pain, leg swelling and palpitations.  Gastrointestinal: Negative for abdominal distention, abdominal pain, constipation, diarrhea, nausea and vomiting.  Endocrine: Negative for hot flashes.  Genitourinary: Positive for menstrual problem, pelvic pain and vaginal bleeding. Negative for dyspareunia and hematuria.   Musculoskeletal: Positive for back pain.  Neurological: Negative for headaches.      PHYSICAL EXAMINATION  ECOG PERFORMANCE STATUS: 1 - Symptomatic but completely ambulatory  Vitals:   10/01/17 1329  BP: (!) 142/75  Pulse: (!) 110  Resp: 18  Temp: 98.2 F (36.8 C)  SpO2: 100%    Physical Exam  Constitutional: She is oriented to person, place, and time and well-developed, well-nourished, and in no distress.  HENT:  Head: Normocephalic and atraumatic.  Mouth/Throat: No oropharyngeal exudate.  Eyes: Pupils are equal, round, and reactive to  light. No scleral icterus.  Neck: Neck supple.  Cardiovascular: Normal rate, regular rhythm and normal heart sounds.  Pulmonary/Chest: Effort normal and breath sounds normal.  Abdominal: Soft. Bowel sounds are normal. She exhibits no distension and no mass. There is no tenderness. There is no rebound and no guarding.  Musculoskeletal: She exhibits no edema.  Lymphadenopathy:    She has no cervical adenopathy.  Neurological: She is alert and oriented to person, place, and time.  Skin: Skin is warm and dry. No rash noted. No erythema.  Psychiatric: Mood and affect normal.    LABORATORY DATA:  CBC    Component Value Date/Time   WBC 5.2 09/21/2017 0908   WBC 5.7 09/14/2017 0858   RBC 3.24 (L) 09/21/2017 0908   HGB 10.8 (L) 09/14/2017 0858   HGB 14.0 04/25/2017 1219   HCT 32.2 (L) 09/21/2017 0908   HCT 40.2 04/25/2017 1219   PLT 262 09/21/2017 0908   PLT 293 04/25/2017 1219   MCV 99.6 09/21/2017 0908   MCV 97.5 04/25/2017 1219   MCH 34.8 (H) 09/21/2017 0908   MCHC 35.0 09/21/2017 0908   RDW 16.1 (H) 09/21/2017 0908   RDW 12.4 04/25/2017 1219   LYMPHSABS 1.2 09/21/2017 0908   LYMPHSABS 2.3 04/25/2017 1219   MONOABS 0.4 09/21/2017 0908   MONOABS 0.9 04/25/2017 1219   EOSABS 0.1 09/21/2017 0908   EOSABS 0.1 04/25/2017 1219   BASOSABS 0.0 09/21/2017 0908   BASOSABS 0.0 04/25/2017 1219    CMP     Component Value Date/Time   NA 138 09/21/2017 0908   NA 140 04/25/2017 1219   K 3.7 09/21/2017 0908   K 3.7 04/25/2017 1219   CL 106 09/21/2017 0908   CO2 24 09/21/2017 0908   CO2 27 04/25/2017 1219   GLUCOSE 160 (H) 09/21/2017 0908   GLUCOSE 102 04/25/2017 1219   BUN 15 09/21/2017 0908   BUN 12.1 04/25/2017 1219   CREATININE 0.80 09/21/2017 0908   CREATININE 0.9 04/25/2017 1219   CALCIUM 8.9 09/21/2017 0908   CALCIUM 9.1 04/25/2017 1219   PROT 6.7 09/21/2017 0908   PROT 7.2 04/25/2017 1219   ALBUMIN 3.7 09/21/2017 0908   ALBUMIN 4.2 04/25/2017 1219  AST 17 09/21/2017  0908   AST 15 04/25/2017 1219   ALT 19 09/21/2017 0908   ALT 16 04/25/2017 1219   ALKPHOS 113 09/21/2017 0908   ALKPHOS 81 04/25/2017 1219   BILITOT 0.4 09/21/2017 0908   BILITOT 0.42 04/25/2017 1219   GFRNONAA >60 09/21/2017 0908   GFRAA >60 09/21/2017 0908       ASSESSMENT and PLAN:   Malignant neoplasm of upper-outer quadrant of left breast in female, estrogen receptor positive ([place]) 06/20/2017 left mastectomy: 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 Adjuvant TCH Perjeta followed by Herceptin and Perjeta maintenance for 1 year 2. followed by adjuvant antiestrogen therapy -------------------------------------------------------------------- Current treatment: TCHP   Alexis Underwood has had her first menses since starting chemotherapy.  This was heavier and more painful than usual, likely because she has skipped a period.  She and I reviewed that while she is finishing up her cycle, it will be challenging to evaluate.  I encouraged to manage her pain with motrin and to monitor her pain.  I reviewed Zoladex with her, which will help with this, by putting her ovaries to sleep.  I reviewed that most women will have one cycle after the shot, and then will stop having cycles.  I reviewed side effects of the injection.    Alexis Underwood will monitor her symptoms, and return in 1 week to review with Dr. Lindi Adie.  They will also discuss Zoladex.  I gave her a handout on Zoladex in her avs.      Orders Placed This Encounter  Procedures  . Urinalysis, Complete w Microscopic    Standing Status:   Future    Standing Expiration Date:   10/02/2018    All questions were answered. The patient knows to call the clinic with any problems, questions or concerns. We can certainly see the patient much sooner if necessary.  A total of (30) minutes of  face-to-face time was spent with this patient with greater than 50% of that time in counseling and care-coordination.  This note was electronically signed. Scot Dock, NP 10/01/2017

## 2017-10-02 ENCOUNTER — Telehealth: Payer: Self-pay | Admitting: Adult Health

## 2017-10-02 ENCOUNTER — Telehealth: Payer: Self-pay | Admitting: Hematology and Oncology

## 2017-10-02 NOTE — Telephone Encounter (Signed)
Per 3/25 no los

## 2017-10-02 NOTE — Telephone Encounter (Signed)
10/02/17 @ 9:55 am, called and left message to inform patient that disability forms have been successfully faxed to [place] at [phone]. I have mailed a copy to patient as well.

## 2017-10-09 ENCOUNTER — Inpatient Hospital Stay: Payer: BLUE CROSS/BLUE SHIELD

## 2017-10-09 ENCOUNTER — Inpatient Hospital Stay: Payer: BLUE CROSS/BLUE SHIELD | Attending: Hematology and Oncology

## 2017-10-09 ENCOUNTER — Ambulatory Visit (HOSPITAL_COMMUNITY)
Admission: RE | Admit: 2017-10-09 | Discharge: 2017-10-09 | Disposition: A | Discharge status: Home / Self Care | Payer: BLUE CROSS/BLUE SHIELD | Source: Ambulatory Visit | Attending: Adult Health | Admitting: Adult Health

## 2017-10-09 ENCOUNTER — Encounter: Payer: Self-pay | Admitting: *Deleted

## 2017-10-09 ENCOUNTER — Inpatient Hospital Stay (HOSPITAL_BASED_OUTPATIENT_CLINIC_OR_DEPARTMENT_OTHER): Payer: BLUE CROSS/BLUE SHIELD | Admitting: Hematology and Oncology

## 2017-10-09 ENCOUNTER — Other Ambulatory Visit: Payer: Self-pay

## 2017-10-09 ENCOUNTER — Other Ambulatory Visit: Payer: Self-pay | Admitting: Adult Health

## 2017-10-09 DIAGNOSIS — C50412 Malignant neoplasm of upper-outer quadrant of left female breast: Secondary | ICD-10-CM

## 2017-10-09 DIAGNOSIS — R102 Pelvic and perineal pain: Secondary | ICD-10-CM

## 2017-10-09 DIAGNOSIS — Z170 Estrogen receptor positive status [ER+]: Secondary | ICD-10-CM

## 2017-10-09 DIAGNOSIS — R310 Gross hematuria: Secondary | ICD-10-CM

## 2017-10-09 DIAGNOSIS — R530 Neoplastic (malignant) related fatigue: Secondary | ICD-10-CM | POA: Diagnosis not present

## 2017-10-09 DIAGNOSIS — L658 Other specified nonscarring hair loss: Secondary | ICD-10-CM | POA: Diagnosis not present

## 2017-10-09 DIAGNOSIS — L739 Follicular disorder, unspecified: Secondary | ICD-10-CM | POA: Diagnosis not present

## 2017-10-09 DIAGNOSIS — R399 Unspecified symptoms and signs involving the genitourinary system: Secondary | ICD-10-CM

## 2017-10-09 DIAGNOSIS — N132 Hydronephrosis with renal and ureteral calculous obstruction: Secondary | ICD-10-CM | POA: Diagnosis not present

## 2017-10-09 DIAGNOSIS — Z5112 Encounter for antineoplastic immunotherapy: Secondary | ICD-10-CM | POA: Insufficient documentation

## 2017-10-09 DIAGNOSIS — R319 Hematuria, unspecified: Secondary | ICD-10-CM

## 2017-10-09 DIAGNOSIS — Z95828 Presence of other vascular implants and grafts: Secondary | ICD-10-CM

## 2017-10-09 DIAGNOSIS — K449 Diaphragmatic hernia without obstruction or gangrene: Secondary | ICD-10-CM | POA: Insufficient documentation

## 2017-10-09 DIAGNOSIS — R1031 Right lower quadrant pain: Secondary | ICD-10-CM

## 2017-10-09 DIAGNOSIS — Z5111 Encounter for antineoplastic chemotherapy: Secondary | ICD-10-CM | POA: Diagnosis present

## 2017-10-09 LAB — CBC WITH DIFFERENTIAL/PLATELET
BASOS PCT: 1 %
Basophils Absolute: 0 10*3/uL (ref 0.0–0.1)
EOS ABS: 0.1 10*3/uL (ref 0.0–0.5)
Eosinophils Relative: 1 %
HEMATOCRIT: 28 % — AB (ref 34.8–46.6)
HEMOGLOBIN: 9.9 g/dL — AB (ref 11.6–15.9)
Lymphocytes Relative: 30 %
Lymphs Abs: 1.5 10*3/uL (ref 0.9–3.3)
MCH: 35.6 pg — ABNORMAL HIGH (ref 25.1–34.0)
MCHC: 35.3 g/dL (ref 31.5–36.0)
MCV: 100.8 fL (ref 79.5–101.0)
MONOS PCT: 13 %
Monocytes Absolute: 0.6 10*3/uL (ref 0.1–0.9)
NEUTROS PCT: 55 %
Neutro Abs: 2.8 10*3/uL (ref 1.5–6.5)
Platelets: 321 10*3/uL (ref 145–400)
RBC: 2.78 MIL/uL — AB (ref 3.70–5.45)
RDW: 18.5 % — ABNORMAL HIGH (ref 11.2–14.5)
WBC: 5 10*3/uL (ref 3.9–10.3)

## 2017-10-09 LAB — COMPREHENSIVE METABOLIC PANEL
ALK PHOS: 118 U/L (ref 40–150)
ALT: 18 U/L (ref 0–55)
AST: 17 U/L (ref 5–34)
Albumin: 3.8 g/dL (ref 3.5–5.0)
Anion gap: 10 (ref 3–11)
BUN: 11 mg/dL (ref 7–26)
CALCIUM: 8.9 mg/dL (ref 8.4–10.4)
CO2: 27 mmol/L (ref 22–29)
Chloride: 104 mmol/L (ref 98–109)
Creatinine, Ser: 0.94 mg/dL (ref 0.60–1.10)
Glucose, Bld: 135 mg/dL (ref 70–140)
Potassium: 3.2 mmol/L — ABNORMAL LOW (ref 3.5–5.1)
SODIUM: 141 mmol/L (ref 136–145)
Total Bilirubin: 0.3 mg/dL (ref 0.2–1.2)
Total Protein: 6.5 g/dL (ref 6.4–8.3)

## 2017-10-09 LAB — URINALYSIS, COMPLETE (UACMP) WITH MICROSCOPIC
BILIRUBIN URINE: NEGATIVE
GLUCOSE, UA: NEGATIVE mg/dL
Ketones, ur: NEGATIVE mg/dL
NITRITE: NEGATIVE
PROTEIN: NEGATIVE mg/dL
Specific Gravity, Urine: 1.01 (ref 1.005–1.030)
pH: 6 (ref 5.0–8.0)

## 2017-10-09 MED ORDER — PALONOSETRON HCL INJECTION 0.25 MG/5ML
0.2500 mg | Freq: Once | INTRAVENOUS | Status: AC
  Administered 2017-10-09: 0.25 mg via INTRAVENOUS

## 2017-10-09 MED ORDER — SODIUM CHLORIDE 0.9 % IV SOLN
Freq: Once | INTRAVENOUS | Status: AC
  Administered 2017-10-09: 11:00:00 via INTRAVENOUS
  Filled 2017-10-09: qty 5

## 2017-10-09 MED ORDER — DIPHENHYDRAMINE HCL 25 MG PO CAPS
ORAL_CAPSULE | ORAL | Status: AC
  Filled 2017-10-09: qty 2

## 2017-10-09 MED ORDER — SODIUM CHLORIDE 0.9% FLUSH
10.0000 mL | INTRAVENOUS | Status: DC | PRN
  Administered 2017-10-09: 10 mL
  Filled 2017-10-09: qty 10

## 2017-10-09 MED ORDER — ACETAMINOPHEN 325 MG PO TABS
650.0000 mg | ORAL_TABLET | Freq: Once | ORAL | Status: AC
  Administered 2017-10-09: 650 mg via ORAL

## 2017-10-09 MED ORDER — GOSERELIN ACETATE 3.6 MG ~~LOC~~ IMPL: 3.6000 mg | DRUG_IMPLANT | Freq: Once | SUBCUTANEOUS | Status: DC

## 2017-10-09 MED ORDER — DIPHENHYDRAMINE HCL 25 MG PO CAPS
50.0000 mg | ORAL_CAPSULE | Freq: Once | ORAL | Status: AC
  Administered 2017-10-09: 50 mg via ORAL

## 2017-10-09 MED ORDER — HEPARIN SOD (PORK) LOCK FLUSH 100 UNIT/ML IV SOLN
500.0000 [IU] | Freq: Once | INTRAVENOUS | Status: AC | PRN
  Administered 2017-10-09: 500 [IU]
  Filled 2017-10-09: qty 5

## 2017-10-09 MED ORDER — SODIUM CHLORIDE 0.9 % IV SOLN
420.0000 mg | Freq: Once | INTRAVENOUS | Status: AC
  Administered 2017-10-09: 420 mg via INTRAVENOUS
  Filled 2017-10-09: qty 14

## 2017-10-09 MED ORDER — SODIUM CHLORIDE 0.9 % IV SOLN
Freq: Once | INTRAVENOUS | Status: AC
  Administered 2017-10-09: 10:00:00 via INTRAVENOUS

## 2017-10-09 MED ORDER — SODIUM CHLORIDE 0.9 % IV SOLN
619.2000 mg | Freq: Once | INTRAVENOUS | Status: AC
  Administered 2017-10-09: 620 mg via INTRAVENOUS
  Filled 2017-10-09: qty 62

## 2017-10-09 MED ORDER — ACETAMINOPHEN 325 MG PO TABS
ORAL_TABLET | ORAL | Status: AC
  Filled 2017-10-09: qty 2

## 2017-10-09 MED ORDER — GOSERELIN ACETATE 3.6 MG ~~LOC~~ IMPL
DRUG_IMPLANT | SUBCUTANEOUS | Status: AC
  Filled 2017-10-09: qty 3.6

## 2017-10-09 MED ORDER — TRASTUZUMAB CHEMO 150 MG IV SOLR
6.0000 mg/kg | Freq: Once | INTRAVENOUS | Status: AC
  Administered 2017-10-09: 378 mg via INTRAVENOUS
  Filled 2017-10-09: qty 18

## 2017-10-09 MED ORDER — PALONOSETRON HCL INJECTION 0.25 MG/5ML
INTRAVENOUS | Status: AC
  Filled 2017-10-09: qty 5

## 2017-10-09 NOTE — Assessment & Plan Note (Signed)
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 Adjuvant TCH Perjeta followed by Herceptin and Perjeta maintenance for 1 year 2. followed by adjuvant antiestrogen therapy -------------------------------------------------------------------- Current treatment: [place] Perjeta(Taxotere discontinued from cycle 2 due to severe rash) Chemo Toxicities: 1.folliculitis   2. Fatigue 3.  Alopecia 4.  Pelvic pain: Recommend Zoladex  Monitoring closely for toxicities Labs have been reviewed  Return to clinic every 3 weeks for chemo.

## 2017-10-09 NOTE — Patient Instructions (Signed)

## 2017-10-09 NOTE — Progress Notes (Signed)
Pt refused to start Zoladex today, pharmacy and [place] desk RN for Dr Lindi Adie aware.

## 2017-10-09 NOTE — Patient Instructions (Signed)
[facility] Discharge Instructions for Patients Receiving Chemotherapy  Today you received the following chemotherapy agents Herceptin, Perjeta, and Carboplatin.   To help prevent nausea and vomiting after your treatment, we encourage you to take your nausea medication as directed.   If you develop nausea and vomiting that is not controlled by your nausea medication, call the clinic.   BELOW ARE SYMPTOMS THAT SHOULD BE REPORTED IMMEDIATELY:  *FEVER GREATER THAN 100.5 F  *CHILLS WITH OR WITHOUT FEVER  NAUSEA AND VOMITING THAT IS NOT CONTROLLED WITH YOUR NAUSEA MEDICATION  *UNUSUAL SHORTNESS OF BREATH  *UNUSUAL BRUISING OR BLEEDING  TENDERNESS IN MOUTH AND THROAT WITH OR WITHOUT PRESENCE OF ULCERS  *URINARY PROBLEMS  *BOWEL PROBLEMS  UNUSUAL RASH Items with * indicate a potential emergency and should be followed up as soon as possible.  Feel free to call the clinic should you have any questions or concerns. The clinic phone number is [phone].  Please show the [facility] [facility] at check-in to the Emergency Department and triage nurse.

## 2017-10-09 NOTE — Progress Notes (Signed)
Patient Care Team: Alexis Ada, MD as PCP - General (Family Medicine)  DIAGNOSIS:  Encounter Diagnosis  Name Primary?  . Malignant neoplasm of upper-outer quadrant of left breast in female, estrogen receptor positive ([place])     SUMMARY OF ONCOLOGIC HISTORY:   Malignant neoplasm of upper-outer quadrant of left breast in female, estrogen receptor positive ([place])   04/16/2017 Initial Diagnosis    Screening mammogram detected calcifications left breast UOQ 9.3 cm, at 2:00: 1.4 cm and at 1:00: 8 mm; biopsy of mass at 1:00: IDC grade 2 with DCIS ER 95%, PR 90%,Ki-67 45%, HER-2 positive ratio 5.18; iopsy of the 2:00 mass and calcifications were DCIS with suspicion for microinvasion, T1 cN0 stage IA clinical stage AJCC 8      05/09/2017 Genetic Testing    The patient had genetic testing due to a personal and family history of breast cancer.  The Common Hereditary Cancer Panel was ordered.  The Hereditary Gene Panel offered by [facility] includes sequencing and/or deletion duplication testing of the following 47 genes: APC, ATM, AXIN2, BARD1, BMPR1A, BRCA1, BRCA2, BRIP1, CDH1, CDKN2A (p14ARF), CDKN2A (p16INK4a), CKD4, CHEK2, CTNNA1, DICER1, EPCAM (Deletion/duplication testing only), GREM1 (promoter region deletion/duplication testing only), KIT, MEN1, MLH1, MSH2, MSH3, MSH6, MUTYH, NBN, NF1, NHTL1, PALB2, PDGFRA, PMS2, POLD1, POLE, PTEN, RAD50, RAD51C, RAD51D, SDHB, SDHC, SDHD, SMAD4, SMARCA4. STK11, TP53, TSC1, TSC2, and VHL.  The following genes were evaluated for sequence changes only: SDHA and HOXB13 c.251G>A variant only.  Results- No pathogenic variants identified.  A Variant of Uncertain Significance was identified in the gene APC c.-30626C>G (Promoter 1B).  The date of this test report is 05/09/2017.       06/20/2017 Surgery    Left mastectomy: Multifocal IDC with DCIS tumor size is 1.1, 1.6 and 2.4 cm, margins negative, 0/5 lymph nodes negative, ER 95%, PR 90%, HER-2 positive ratio 5.18,  Ki-67 45% T2N0 stage Ib pathologic stage      07/24/2017 -  Chemotherapy    TCH P followed by Herceptin and Perjeta maintenance        CHIEF COMPLIANT: Cycle 4 chemotherapy  INTERVAL HISTORY: Alexis Underwood is a 42 year old with above-mentioned history of left breast cancer treated with mastectomy and is currently on adjuvant chemotherapy and today is cycle 4.  Taxotere was discontinued because of severe rash after 1 cycle.  She came in complaining of severe pelvic pain.  It sounds like she had a kidney stone.  She had previously had kidney stones.  She currently has blood in the urine.  She denies any nausea vomiting.  Denies any significant diarrhea.  REVIEW OF SYSTEMS:   Constitutional: Denies fevers, chills or abnormal weight loss Eyes: Denies blurriness of vision Ears, nose, mouth, throat, and face: Denies mucositis or sore throat Respiratory: Denies cough, dyspnea or wheezes Cardiovascular: Denies palpitation, chest discomfort Gastrointestinal:  Denies nausea, heartburn or change in bowel habits Skin: Denies abnormal skin rashes Lymphatics: Denies new lymphadenopathy or easy bruising Neurological:Denies numbness, tingling or new weaknesses Behavioral/Psych: Mood is stable, no new changes  Extremities: No lower extremity edema All other systems were reviewed with the patient and are negative.  I have reviewed the past medical history, past surgical history, social history and family history with the patient and they are unchanged from previous note.  ALLERGIES:  has No Known Allergies.  MEDICATIONS:  Current Outpatient Medications  Medication Sig Dispense Refill  . acetaminophen (TYLENOL) 325 MG tablet Take 650 mg by mouth daily as needed for  headache.    . clindamycin (CLINDAGEL) 1 % gel Apply topically 2 (two) times daily. 30 g 0  . dextromethorphan-guaiFENesin (MUCINEX DM) 30-600 MG 12hr tablet Take 1 tablet by mouth 2 (two) times daily as needed for cough.    .  doxycycline (VIBRA-TABS) 100 MG tablet Take 1 tablet (100 mg total) by mouth 2 (two) times daily. 20 tablet 0  . lidocaine-prilocaine (EMLA) cream Apply 1 application topically daily as needed (port access).     . LORazepam (ATIVAN) 0.5 MG tablet Take 1 tablet (0.5 mg total) by mouth at bedtime as needed for sleep. 30 tablet 0  . lovastatin (MEVACOR) 40 MG tablet Take 40 mg by mouth every evening.    . ondansetron (ZOFRAN) 8 MG tablet Take 1 tablet (8 mg total) by mouth 2 (two) times daily as needed (Nausea or vomiting). Begin 4 days after chemotherapy. 30 tablet 1  . pantoprazole (PROTONIX) 40 MG tablet Take 1 tablet (40 mg total) by mouth daily. 30 tablet 3  . prochlorperazine (COMPAZINE) 10 MG tablet Take 1 tablet (10 mg total) by mouth every 6 (six) hours as needed (Nausea or vomiting). 30 tablet 1  . tamoxifen (NOLVADEX) 20 MG tablet Take 1 tablet (20 mg total) by mouth daily. (Patient not taking: Reported on 10/01/2017) 30 tablet 0  . triamcinolone ointment (KENALOG) 0.5 % Apply 1 application topically 2 (two) times daily. 30 g 0   No current facility-administered medications for this visit.     PHYSICAL EXAMINATION: ECOG PERFORMANCE STATUS: 1 - Symptomatic but completely ambulatory  Vitals:   10/09/17 0913  BP: (!) 145/93  Pulse: 96  Resp: 18  Temp: 98 F (36.7 C)  SpO2: 100%   Filed Weights   10/09/17 0913  Weight: 140 lb 12.8 oz (63.9 kg)    GENERAL:alert, no distress and comfortable SKIN: skin color, texture, turgor are normal, no rashes or significant lesions EYES: normal, Conjunctiva are pink and non-injected, sclera clear OROPHARYNX:no exudate, no erythema and lips, buccal mucosa, and tongue normal  NECK: supple, thyroid normal size, non-tender, without nodularity LYMPH:  no palpable lymphadenopathy in the cervical, axillary or inguinal LUNGS: clear to auscultation and percussion with normal breathing effort HEART: regular rate & rhythm and no murmurs and no lower  extremity edema ABDOMEN:abdomen soft, non-tender and normal bowel sounds MUSCULOSKELETAL:no cyanosis of digits and no clubbing  NEURO: alert & oriented x 3 with fluent speech, no focal motor/sensory deficits EXTREMITIES: No lower extremity edema  LABORATORY DATA:  I have reviewed the data as listed CMP Latest Ref Rng & Units 10/09/2017 09/21/2017 09/14/2017  Glucose 70 - 140 mg/dL 135 160(H) 159(H)  BUN 7 - 26 mg/dL _0 Creatinine 0.60 - 1.10 mg/dL 0.94 0.80 0.78  Sodium 136 - 145 mmol/L 141 138 141  Potassium 3.5 - 5.1 mmol/L 3.2(L) 3.7 3.5  Chloride 98 - 109 mmol/L 104 106 105  CO2 22 - 29 mmol/L _1 Calcium 8.4 - 10.4 mg/dL 8.9 8.9 8.8  Total Protein 6.4 - 8.3 g/dL 6.5 6.7 6.1(L)  Total Bilirubin 0.2 - 1.2 mg/dL 0.3 0.4 0.4  Alkaline Phos 40 - 150 U/L 118 113 134  AST 5 - 34 U/L _2 ALT 0 - 55 U/L _3 Lab Results  Component Value Date   WBC 5.0 10/09/2017   HGB 9.9 (L) 10/09/2017   HCT 28.0 (L) 10/09/2017   MCV 100.8 10/09/2017   PLT  321 10/09/2017   NEUTROABS 2.8 10/09/2017    ASSESSMENT & PLAN:  Malignant neoplasm of upper-outer quadrant of left breast in female, estrogen receptor positive ([place]) 06/20/2017 left mastectomy: 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 Adjuvant TCH Perjeta followed by Herceptin and Perjeta maintenance for 1 year 2. followed by adjuvant antiestrogen therapy -------------------------------------------------------------------- Current treatment: [place] Perjeta(Taxotere discontinued from cycle 2 due to severe rash) Chemo Toxicities: 1.folliculitis   2. Fatigue 3.  Alopecia 4.  Pelvic pain: Probably from kidney stone.  She still has blood in the urine.  UA was obtained today.  We discussed the role of getting a CT of her abdomen.  If the urine  shows large amount of blood then we may have to perform a CT abdomen and consider urology referral.  Monitoring closely for toxicities Labs have been reviewed  Return to clinic every 3 weeks for chemo.  No orders of the defined types were placed in this encounter.  The patient has a good understanding of the overall plan. she agrees with it. she will call with any problems that may develop before the next visit here.   Harriette Ohara, MD 10/09/17

## 2017-10-10 ENCOUNTER — Other Ambulatory Visit: Payer: Self-pay | Admitting: Hematology and Oncology

## 2017-10-10 DIAGNOSIS — N200 Calculus of kidney: Secondary | ICD-10-CM

## 2017-10-12 ENCOUNTER — Telehealth: Payer: Self-pay | Admitting: Hematology and Oncology

## 2017-10-12 NOTE — Telephone Encounter (Signed)
Sent referral to [facility] per 4/5 sch message

## 2017-10-16 ENCOUNTER — Ambulatory Visit: Payer: BLUE CROSS/BLUE SHIELD | Admitting: Hematology and Oncology

## 2017-10-16 ENCOUNTER — Other Ambulatory Visit: Payer: BLUE CROSS/BLUE SHIELD

## 2017-10-16 ENCOUNTER — Ambulatory Visit: Payer: BLUE CROSS/BLUE SHIELD

## 2017-10-25 ENCOUNTER — Telehealth (HOSPITAL_COMMUNITY): Payer: Self-pay | Admitting: Vascular Surgery

## 2017-10-25 NOTE — Telephone Encounter (Signed)
Left pt message ,stating DB will not be in clinic June 19, she would need to change echo and f/u  appt to another date

## 2017-10-30 ENCOUNTER — Inpatient Hospital Stay (HOSPITAL_BASED_OUTPATIENT_CLINIC_OR_DEPARTMENT_OTHER): Payer: BLUE CROSS/BLUE SHIELD | Admitting: Adult Health

## 2017-10-30 ENCOUNTER — Inpatient Hospital Stay: Payer: BLUE CROSS/BLUE SHIELD

## 2017-10-30 ENCOUNTER — Other Ambulatory Visit: Payer: Self-pay | Admitting: *Deleted

## 2017-10-30 ENCOUNTER — Encounter: Payer: Self-pay | Admitting: Adult Health

## 2017-10-30 ENCOUNTER — Encounter: Payer: Self-pay | Admitting: *Deleted

## 2017-10-30 VITALS — BP 144/85 | HR 90 | Temp 97.9°F | Resp 20 | Ht 61.0 in | Wt 142.9 lb

## 2017-10-30 DIAGNOSIS — K121 Other forms of stomatitis: Secondary | ICD-10-CM | POA: Diagnosis not present

## 2017-10-30 DIAGNOSIS — Z170 Estrogen receptor positive status [ER+]: Secondary | ICD-10-CM | POA: Diagnosis not present

## 2017-10-30 DIAGNOSIS — Z803 Family history of malignant neoplasm of breast: Secondary | ICD-10-CM

## 2017-10-30 DIAGNOSIS — D649 Anemia, unspecified: Secondary | ICD-10-CM | POA: Diagnosis not present

## 2017-10-30 DIAGNOSIS — N920 Excessive and frequent menstruation with regular cycle: Secondary | ICD-10-CM | POA: Diagnosis not present

## 2017-10-30 DIAGNOSIS — Z808 Family history of malignant neoplasm of other organs or systems: Secondary | ICD-10-CM | POA: Diagnosis not present

## 2017-10-30 DIAGNOSIS — R5383 Other fatigue: Secondary | ICD-10-CM

## 2017-10-30 DIAGNOSIS — C50412 Malignant neoplasm of upper-outer quadrant of left female breast: Secondary | ICD-10-CM

## 2017-10-30 DIAGNOSIS — Z95828 Presence of other vascular implants and grafts: Secondary | ICD-10-CM

## 2017-10-30 LAB — CBC WITH DIFFERENTIAL/PLATELET
Basophils Absolute: 0 10*3/uL (ref 0.0–0.1)
Basophils Relative: 0 %
Eosinophils Absolute: 0 10*3/uL (ref 0.0–0.5)
Eosinophils Relative: 0 %
HEMATOCRIT: 25.7 % — AB (ref 34.8–46.6)
HEMOGLOBIN: 8.8 g/dL — AB (ref 11.6–15.9)
LYMPHS ABS: 1.4 10*3/uL (ref 0.9–3.3)
LYMPHS PCT: 38 %
MCH: 35.8 pg — ABNORMAL HIGH (ref 25.1–34.0)
MCHC: 34.2 g/dL (ref 31.5–36.0)
MCV: 104.5 fL — AB (ref 79.5–101.0)
MONO ABS: 0.4 10*3/uL (ref 0.1–0.9)
MONOS PCT: 12 %
NEUTROS ABS: 1.8 10*3/uL (ref 1.5–6.5)
Neutrophils Relative %: 50 %
Platelets: 152 10*3/uL (ref 145–400)
RBC: 2.46 MIL/uL — ABNORMAL LOW (ref 3.70–5.45)
RDW: 16.7 % — AB (ref 11.2–14.5)
WBC: 3.6 10*3/uL — ABNORMAL LOW (ref 3.9–10.3)

## 2017-10-30 LAB — COMPREHENSIVE METABOLIC PANEL
ALBUMIN: 3.8 g/dL (ref 3.5–5.0)
ALK PHOS: 125 U/L (ref 40–150)
ALT: 34 U/L (ref 0–55)
ANION GAP: 11 (ref 3–11)
AST: 25 U/L (ref 5–34)
BUN: 10 mg/dL (ref 7–26)
CHLORIDE: 106 mmol/L (ref 98–109)
CO2: 24 mmol/L (ref 22–29)
Calcium: 9.2 mg/dL (ref 8.4–10.4)
Creatinine, Ser: 0.81 mg/dL (ref 0.60–1.10)
GFR calc Af Amer: >60 mL/min (ref >60)
GFR calc non Af Amer: >60 mL/min (ref >60)
GLUCOSE: 142 mg/dL — AB (ref 70–140)
POTASSIUM: 3.6 mmol/L (ref 3.5–5.1)
SODIUM: 141 mmol/L (ref 136–145)
Total Bilirubin: 0.3 mg/dL (ref 0.2–1.2)
Total Protein: 6.3 g/dL — ABNORMAL LOW (ref 6.4–8.3)

## 2017-10-30 MED ORDER — SODIUM CHLORIDE 0.9 % IV SOLN
420.0000 mg | Freq: Once | INTRAVENOUS | Status: AC
  Administered 2017-10-30: 420 mg via INTRAVENOUS
  Filled 2017-10-30: qty 14

## 2017-10-30 MED ORDER — CARBOPLATIN CHEMO INJECTION 600 MG/60ML
694.8000 mg | Freq: Once | INTRAVENOUS | Status: AC
  Administered 2017-10-30: 690 mg via INTRAVENOUS
  Filled 2017-10-30: qty 69

## 2017-10-30 MED ORDER — PALONOSETRON HCL INJECTION 0.25 MG/5ML
0.2500 mg | Freq: Once | INTRAVENOUS | Status: AC
  Administered 2017-10-30: 0.25 mg via INTRAVENOUS

## 2017-10-30 MED ORDER — DIPHENHYDRAMINE HCL 25 MG PO CAPS
ORAL_CAPSULE | ORAL | Status: AC
  Filled 2017-10-30: qty 2

## 2017-10-30 MED ORDER — PALONOSETRON HCL INJECTION 0.25 MG/5ML
INTRAVENOUS | Status: AC
  Filled 2017-10-30: qty 5

## 2017-10-30 MED ORDER — ACETAMINOPHEN 325 MG PO TABS
650.0000 mg | ORAL_TABLET | Freq: Once | ORAL | Status: AC
  Administered 2017-10-30: 650 mg via ORAL

## 2017-10-30 MED ORDER — SODIUM CHLORIDE 0.9% FLUSH
10.0000 mL | INTRAVENOUS | Status: DC | PRN
  Administered 2017-10-30: 10 mL via INTRAVENOUS
  Filled 2017-10-30: qty 10

## 2017-10-30 MED ORDER — ACETAMINOPHEN 325 MG PO TABS
ORAL_TABLET | ORAL | Status: AC
  Filled 2017-10-30: qty 2

## 2017-10-30 MED ORDER — SODIUM CHLORIDE 0.9% FLUSH
10.0000 mL | INTRAVENOUS | Status: DC | PRN
  Administered 2017-10-30: 10 mL
  Filled 2017-10-30: qty 10

## 2017-10-30 MED ORDER — HEPARIN SOD (PORK) LOCK FLUSH 100 UNIT/ML IV SOLN
500.0000 [IU] | Freq: Once | INTRAVENOUS | Status: AC | PRN
  Administered 2017-10-30: 500 [IU]
  Filled 2017-10-30: qty 5

## 2017-10-30 MED ORDER — TRASTUZUMAB CHEMO 150 MG IV SOLR
6.0000 mg/kg | Freq: Once | INTRAVENOUS | Status: AC
  Administered 2017-10-30: 378 mg via INTRAVENOUS
  Filled 2017-10-30: qty 18

## 2017-10-30 MED ORDER — SODIUM CHLORIDE 0.9 % IV SOLN
Freq: Once | INTRAVENOUS | Status: AC
  Administered 2017-10-30: 10:00:00 via INTRAVENOUS

## 2017-10-30 MED ORDER — DIPHENHYDRAMINE HCL 25 MG PO CAPS
50.0000 mg | ORAL_CAPSULE | Freq: Once | ORAL | Status: AC
  Administered 2017-10-30: 50 mg via ORAL

## 2017-10-30 MED ORDER — SODIUM CHLORIDE 0.9 % IV SOLN
Freq: Once | INTRAVENOUS | Status: AC
  Administered 2017-10-30: 10:00:00 via INTRAVENOUS
  Filled 2017-10-30: qty 5

## 2017-10-30 MED ORDER — MAGIC MOUTHWASH W/LIDOCAINE: 5.0000 mL | Freq: Four times a day (QID) | ORAL | 0 refills | Status: DC | PRN

## 2017-10-30 NOTE — Assessment & Plan Note (Addendum)
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 Adjuvant TCH Perjeta followed by Herceptin and Perjeta maintenance for 1 year 2. followed by adjuvant antiestrogen therapy -------------------------------------------------------------------- Current treatment: [place] Perjeta(Taxotere discontinued from cycle 2 due to severe rash)  Alexis Underwood is doing well today.  Her CBC is stable. She is slightly more anemic today and I reviewed this with her.  This is likely multifactorial between the chemotherapy, her heavy menstrual cycle, and kidney stone.  She will eat iron rich foods, and I gave her a handout on that today in her AVS.    She will proceed with treatment today (so long as CMET is within parameters).    I sent in some magic mouthwash for her oral ulcers, so she can use it if needed if the ulceration returns.    Alexis Underwood will return in 3 weeks for labs, f/u with Dr. Lindi Adie, and cycle 6 of treatment.

## 2017-10-30 NOTE — Progress Notes (Signed)
[facility] Follow up:    Alexis Underwood, [place] [id number]   DIAGNOSIS: Cancer Staging Malignant neoplasm of upper-outer quadrant of left breast in female, estrogen receptor positive (Alexis Underwood) Staging form: Breast, AJCC 8th Edition - Clinical stage from 04/25/2017: Stage IA (cT1c, cN0, cM0, G2, ER: Positive, PR: Positive, HER2: Positive) - Unsigned   SUMMARY OF ONCOLOGIC HISTORY:   Malignant neoplasm of upper-outer quadrant of left breast in female, estrogen receptor positive (Alexis Underwood)   04/16/2017 Initial Diagnosis    Screening mammogram detected calcifications left breast UOQ 9.3 cm, at 2:00: 1.4 cm and at 1:00: 8 mm; biopsy of mass at 1:00: IDC grade 2 with DCIS ER 95%, PR 90%,Ki-67 45%, HER-2 positive ratio 5.18; iopsy of the 2:00 mass and calcifications were DCIS with suspicion for microinvasion, T1 cN0 stage IA clinical stage AJCC 8      05/09/2017 Genetic Testing    The patient had genetic testing due to a personal and family history of breast cancer.  The Common Hereditary Cancer Panel was ordered.  The Hereditary Gene Panel offered by [facility] includes sequencing and/or deletion duplication testing of the following 47 genes: APC, ATM, AXIN2, BARD1, BMPR1A, BRCA1, BRCA2, BRIP1, CDH1, CDKN2A (p14ARF), CDKN2A (p16INK4a), CKD4, CHEK2, CTNNA1, DICER1, EPCAM (Deletion/duplication testing only), GREM1 (promoter region deletion/duplication testing only), KIT, MEN1, MLH1, MSH2, MSH3, MSH6, MUTYH, NBN, NF1, NHTL1, PALB2, PDGFRA, PMS2, POLD1, POLE, PTEN, RAD50, RAD51C, RAD51D, SDHB, SDHC, SDHD, SMAD4, SMARCA4. STK11, TP53, TSC1, TSC2, and VHL.  The following genes were evaluated for sequence changes only: SDHA and HOXB13 c.251G>A variant only.  Results- No pathogenic variants identified.  A Variant of Uncertain Significance was identified in the gene APC c.-30626C>G (Promoter 1B).  The date of this test report is 05/09/2017.       06/20/2017  Surgery    Left mastectomy: Multifocal IDC with DCIS tumor size is 1.1, 1.6 and 2.4 cm, margins negative, 0/5 lymph nodes negative, ER 95%, PR 90%, HER-2 positive ratio 5.18, Ki-67 45% T2N0 stage Ib pathologic stage      07/24/2017 -  Chemotherapy    TCH P followed by Herceptin and Perjeta maintenance        CURRENT THERAPY: TCHP  INTERVAL HISTORY: Alexis Underwood 42 y.o. female returns for evaluation prior to receiving her fifth cycle of TCHP for her triple positive breast cancer.  She is doing well today. She has noted an oral ulcer.  She did have a kidney stone that passed and she has been taking antibiotics since it had been present for a while.  Alexis Underwood is doing well otherwise.  She particularly denies peripheral neuropathy.     Patient Active Problem List   Diagnosis Date Noted  . Port-A-Cath in place 07/31/2017  . Breast cancer ([place]) 06/20/2017  . Genetic testing 05/21/2017  . Family history of breast cancer   . Family history of throat cancer   . Malignant neoplasm of upper-outer quadrant of left breast in female, estrogen receptor positive ([place]) 04/19/2017    has No Known Allergies.  MEDICAL HISTORY: Past Medical History:  Diagnosis Date  . Breast cancer ([place])   . Family history of breast cancer   . Family history of throat cancer   . History of kidney stones   . Hyperlipidemia   . SVD (spontaneous vaginal delivery)     SURGICAL HISTORY: Past Surgical History:  Procedure Laterality Date  . BREAST BIOPSY Left 04/18/2017   Malignant  .  BREAST BIOPSY Right 06/11/2017  . BREAST RECONSTRUCTION WITH PLACEMENT OF TISSUE EXPANDER AND FLEX HD (ACELLULAR HYDRATED DERMIS) Left 06/20/2017   Procedure: LEFT BREAST RECONSTRUCTION WITH PLACEMENT OF TISSUE EXPANDER AND FLEX HD (ACELLULAR HYDRATED DERMIS);  Surgeon: Wallace Going, DO;  Location: [facility];  Service: Plastics;  Laterality: Left;  . COLONOSCOPY    . MASTECTOMY W/ SENTINEL NODE BIOPSY Left 06/20/2017  .  MASTECTOMY W/ SENTINEL NODE BIOPSY Left 06/20/2017   Procedure: LEFT MASTECTOMY WITH LEFT SENTINEL LYMPH NODE BIOPSY;  Surgeon: Jovita Kussmaul, MD;  Location: [facility];  Service: General;  Laterality: Left;  . PORTACATH PLACEMENT Right 06/20/2017   Procedure: INSERTION PORT-A-CATH;  Surgeon: Jovita Kussmaul, MD;  Location: [facility];  Service: General;  Laterality: Right;  . WISDOM TOOTH EXTRACTION      SOCIAL HISTORY: Social History   Socioeconomic History  . Marital status: Married    Spouse name: Not on file  . Number of children: Not on file  . Years of education: Not on file  . Highest education level: Not on file  Occupational History  . Not on file  Social Needs  . Financial resource strain: Not on file  . Food insecurity:    Worry: Not on file    Inability: Not on file  . Transportation needs:    Medical: Not on file    Non-medical: Not on file  Tobacco Use  . Smoking status: Never Smoker  . Smokeless tobacco: Never Used  Substance and Sexual Activity  . Alcohol use: No  . Drug use: No  . Sexual activity: Not on file  Lifestyle  . Physical activity:    Days per week: Not on file    Minutes per session: Not on file  . Stress: Not on file  Relationships  . Social connections:    Talks on phone: Not on file    Gets together: Not on file    Attends religious service: Not on file    Active member of club or organization: Not on file    Attends meetings of clubs or organizations: Not on file    Relationship status: Not on file  . Intimate partner violence:    Fear of current or ex partner: Not on file    Emotionally abused: Not on file    Physically abused: Not on file    Forced sexual activity: Not on file  Other Topics Concern  . Not on file  Social History Narrative  . Not on file    FAMILY HISTORY: Family History  Problem Relation Age of Onset  . Heart attack Mother   . Hyperlipidemia Mother   . Hypertension Mother   . Heart failure Mother   . Breast  cancer Paternal Grandmother 68  . Heart disease Maternal Grandfather        died in 73's  . Throat cancer Paternal Grandfather 66    Review of Systems  Constitutional: Positive for fatigue. Negative for appetite change, chills and fever.  HENT:   Positive for mouth sores. Negative for hearing loss, lump/mass and trouble swallowing.   Eyes: Negative for eye problems and icterus.  Respiratory: Negative for chest tightness, cough and shortness of breath.   Cardiovascular: Negative for chest pain, leg swelling and palpitations.  Gastrointestinal: Positive for diarrhea (x1 managed with oral anti diarrheal without difficulty). Negative for abdominal distention, abdominal pain, constipation, nausea and vomiting.  Endocrine: Negative for hot flashes.  Skin: Negative for itching and rash.  Neurological: Negative for dizziness, extremity weakness and headaches.  Psychiatric/Behavioral: The patient is not nervous/anxious.       PHYSICAL EXAMINATION  ECOG PERFORMANCE STATUS: 1 - Symptomatic but completely ambulatory  Vitals:   10/30/17 0911  BP: (!) 144/85  Pulse: 90  Resp: 20  Temp: 97.9 F (36.6 C)  SpO2: 100%    Physical Exam  Constitutional: She is oriented to person, place, and time and well-developed, well-nourished, and in no distress.  HENT:  Head: Normocephalic and atraumatic.  Mouth/Throat: Oropharynx is clear and moist. No oropharyngeal exudate.  Eyes: Pupils are equal, round, and reactive to light. No scleral icterus.  Neck: Neck supple.  Cardiovascular: Normal rate, regular rhythm and normal heart sounds.  Pulmonary/Chest: Effort normal and breath sounds normal.  Abdominal: Soft. Bowel sounds are normal. She exhibits no distension and no mass. There is no tenderness. There is no rebound and no guarding.  Musculoskeletal: She exhibits no edema.  Lymphadenopathy:    She has no cervical adenopathy.  Neurological: She is alert and oriented to person, place, and time.   Skin: Skin is warm and dry. No rash noted.  Psychiatric: Mood and affect normal.    LABORATORY DATA:  CBC    Component Value Date/Time   WBC 3.6 (L) 10/30/2017 0837   RBC 2.46 (L) 10/30/2017 0837   HGB 8.8 (L) 10/30/2017 0837   HGB 11.3 (L) 09/21/2017 0908   HGB 14.0 04/25/2017 1219   HCT 25.7 (L) 10/30/2017 0837   HCT 40.2 04/25/2017 1219   PLT 152 10/30/2017 0837   PLT 262 09/21/2017 0908   PLT 293 04/25/2017 1219   MCV 104.5 (H) 10/30/2017 0837   MCV 97.5 04/25/2017 1219   MCH 35.8 (H) 10/30/2017 0837   MCHC 34.2 10/30/2017 0837   RDW 16.7 (H) 10/30/2017 0837   RDW 12.4 04/25/2017 1219   LYMPHSABS 1.4 10/30/2017 0837   LYMPHSABS 2.3 04/25/2017 1219   MONOABS 0.4 10/30/2017 0837   MONOABS 0.9 04/25/2017 1219   EOSABS 0.0 10/30/2017 0837   EOSABS 0.1 04/25/2017 1219   BASOSABS 0.0 10/30/2017 0837   BASOSABS 0.0 04/25/2017 1219    CMP     Component Value Date/Time   NA 141 10/30/2017 0837   NA 140 04/25/2017 1219   K 3.6 10/30/2017 0837   K 3.7 04/25/2017 1219   CL 106 10/30/2017 0837   CO2 24 10/30/2017 0837   CO2 27 04/25/2017 1219   GLUCOSE 142 (H) 10/30/2017 0837   GLUCOSE 102 04/25/2017 1219   BUN 10 10/30/2017 0837   BUN 12.1 04/25/2017 1219   CREATININE 0.81 10/30/2017 0837   CREATININE 0.80 09/21/2017 0908   CREATININE 0.9 04/25/2017 1219   CALCIUM 9.2 10/30/2017 0837   CALCIUM 9.1 04/25/2017 1219   PROT 6.3 (L) 10/30/2017 0837   PROT 7.2 04/25/2017 1219   ALBUMIN 3.8 10/30/2017 0837   ALBUMIN 4.2 04/25/2017 1219   AST 25 10/30/2017 0837   AST 17 09/21/2017 0908   AST 15 04/25/2017 1219   ALT 34 10/30/2017 0837   ALT 19 09/21/2017 0908   ALT 16 04/25/2017 1219   ALKPHOS 125 10/30/2017 0837   ALKPHOS 81 04/25/2017 1219   BILITOT 0.3 10/30/2017 0837   BILITOT 0.4 09/21/2017 0908   BILITOT 0.42 04/25/2017 1219   GFRNONAA >60 10/30/2017 0837   GFRNONAA >60 09/21/2017 0908   GFRAA >60 10/30/2017 0837   GFRAA >60 09/21/2017 0908       ASSESSMENT and PLAN:  Malignant neoplasm of upper-outer quadrant of left breast in female, estrogen receptor positive (Alexis Underwood) 06/20/2017 left mastectomy: 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 Adjuvant TCH Perjeta followed by Herceptin and Perjeta maintenance for 1 year 2. followed by adjuvant antiestrogen therapy -------------------------------------------------------------------- Current treatment: [place] Perjeta(Taxotere discontinued from cycle 2 due to severe rash)  Alexis Underwood is doing well today.  Her CBC is stable. She is slightly more anemic today and I reviewed this with her.  This is likely multifactorial between the chemotherapy, her heavy menstrual cycle, and kidney stone.  She will eat iron rich foods, and I gave her a handout on that today in her AVS.    She will proceed with treatment today (so long as CMET is within parameters).    I sent in some magic mouthwash for her oral ulcers, so she can use it if needed if the ulceration returns.    Alexis Underwood will return in 3 weeks for labs, f/u with Dr. Lindi Adie, and cycle 6 of treatment.      All questions were answered. The patient knows to call the clinic with any problems, questions or concerns. We can certainly see the patient much sooner if necessary.  A total of (20) minutes of face-to-face time was spent with this patient with greater than 50% of that time in counseling and care-coordination.  This note was electronically signed. Scot Dock, NP 10/30/2017

## 2017-10-30 NOTE — Patient Instructions (Signed)
Iron-Rich Diet Iron is a mineral that helps your body to produce hemoglobin. Hemoglobin is a protein in your red blood cells that carries oxygen to your body's tissues. Eating too little iron may cause you to feel weak and tired, and it can increase your risk for infection. Eating enough iron is necessary for your body's metabolism, muscle function, and nervous system. Iron is naturally found in many foods. It can also be added to foods or fortified in foods. There are two types of dietary iron:  Heme iron. Heme iron is absorbed by the body more easily than nonheme iron. Heme iron is found in meat, poultry, and fish.  Nonheme iron. Nonheme iron is found in dietary supplements, iron-fortified grains, beans, and vegetables.  You may need to follow an iron-rich diet if:  You have been diagnosed with iron deficiency or iron-deficiency anemia.  You have a condition that prevents you from absorbing dietary iron, such as: ? Infection in your intestines. ? Celiac disease. This involves long-lasting (chronic) inflammation of your intestines.  You do not eat enough iron.  You eat a diet that is high in foods that impair iron absorption.  You have lost a lot of blood.  You have heavy bleeding during your menstrual cycle.  You are pregnant.  What is my plan? Your health care provider may help you to determine how much iron you need per day based on your condition. Generally, when a person consumes sufficient amounts of iron in the diet, the following iron needs are met:  Men. ? 14-18 years old: 11 mg per day. ? 19-50 years old: 8 mg per day.  Women. ? 14-18 years old: 15 mg per day. ? 19-50 years old: 18 mg per day. ? Over 50 years old: 8 mg per day. ? Pregnant women: 27 mg per day. ? Breastfeeding women: 9 mg per day.  What do I need to know about an iron-rich diet?  Eat fresh fruits and vegetables that are high in vitamin C along with foods that are high in iron. This will help  increase the amount of iron that your body absorbs from food, especially with foods containing nonheme iron. Foods that are high in vitamin C include oranges, peppers, tomatoes, and mango.  Take iron supplements only as directed by your health care provider. Overdose of iron can be life-threatening. If you were prescribed iron supplements, take them with orange juice or a vitamin C supplement.  Cook foods in pots and pans that are made from iron.  Eat nonheme iron-containing foods alongside foods that are high in heme iron. This helps to improve your iron absorption.  Certain foods and drinks contain compounds that impair iron absorption. Avoid eating these foods in the same meal as iron-rich foods or with iron supplements. These include: ? Coffee, black tea, and red wine. ? Milk, dairy products, and foods that are high in calcium. ? Beans, soybeans, and peas. ? Whole grains.  When eating foods that contain both nonheme iron and compounds that impair iron absorption, follow these tips to absorb iron better. ? Soak beans overnight before cooking. ? Soak whole grains overnight and drain them before using. ? Ferment flours before baking, such as using yeast in bread dough. What foods can I eat? Grains Iron-fortified breakfast cereal. Iron-fortified whole-wheat bread. Enriched rice. Sprouted grains. Vegetables Spinach. Potatoes with skin. Green peas. Broccoli. Red and green bell peppers. Fermented vegetables. Fruits Prunes. Raisins. Oranges. Strawberries. Mango. Grapefruit. Meats and Other Protein Sources   Beef liver. Oysters. Beef. Shrimp. [place]. Chicken. [place]. Sardines. Chickpeas. Nuts. Tofu. Beverages Tomato juice. Fresh orange juice. Prune juice. Hibiscus tea. Fortified instant breakfast shakes. Condiments Tahini. Fermented soy sauce. Sweets and Desserts Black-strap molasses. Other Wheat germ. The items listed above may not be a complete list of recommended foods or beverages.  Contact your dietitian for more options. What foods are not recommended? Grains Whole grains. Bran cereal. Bran flour. Oats. Vegetables Artichokes. Brussels sprouts. Kale. Fruits Blueberries. Raspberries. Strawberries. Figs. Meats and Other Protein Sources Soybeans. Products made from soy protein. Dairy Milk. Cream. Cheese. Yogurt. Cottage cheese. Beverages Coffee. Black tea. Red wine. Sweets and Desserts Cocoa. Chocolate. Ice cream. Other Basil. Oregano. Parsley. The items listed above may not be a complete list of foods and beverages to avoid. Contact your dietitian for more information. This information is not intended to replace advice given to you by your health care provider. Make sure you discuss any questions you have with your health care provider. Document Released: 02/07/2005 Document Revised: 01/14/2016 Document Reviewed: 01/21/2014 Elsevier Interactive Patient Education  [employer].

## 2017-10-30 NOTE — Patient Instructions (Signed)
[facility] Discharge Instructions for Patients Receiving Chemotherapy  Today you received the following chemotherapy agents Herceptin, Perjeta, Carboplatin  To help prevent nausea and vomiting after your treatment, we encourage you to take your nausea medication as directed  If you develop nausea and vomiting that is not controlled by your nausea medication, call the clinic.   BELOW ARE SYMPTOMS THAT SHOULD BE REPORTED IMMEDIATELY:  *FEVER GREATER THAN 100.5 F  *CHILLS WITH OR WITHOUT FEVER  NAUSEA AND VOMITING THAT IS NOT CONTROLLED WITH YOUR NAUSEA MEDICATION  *UNUSUAL SHORTNESS OF BREATH  *UNUSUAL BRUISING OR BLEEDING  TENDERNESS IN MOUTH AND THROAT WITH OR WITHOUT PRESENCE OF ULCERS  *URINARY PROBLEMS  *BOWEL PROBLEMS  UNUSUAL RASH Items with * indicate a potential emergency and should be followed up as soon as possible.  Feel free to call the clinic should you have any questions or concerns. The clinic phone number is [phone].  Please show the [facility] at check-in to the Emergency Department and triage nurse.

## 2017-10-30 NOTE — Patient Instructions (Signed)

## 2017-10-31 ENCOUNTER — Telehealth: Payer: Self-pay

## 2017-10-31 NOTE — Telephone Encounter (Signed)
Per 4/23 no los 

## 2017-11-06 ENCOUNTER — Other Ambulatory Visit: Payer: BLUE CROSS/BLUE SHIELD

## 2017-11-06 ENCOUNTER — Ambulatory Visit: Payer: BLUE CROSS/BLUE SHIELD | Admitting: Hematology and Oncology

## 2017-11-06 ENCOUNTER — Ambulatory Visit: Payer: BLUE CROSS/BLUE SHIELD

## 2017-11-20 ENCOUNTER — Telehealth: Payer: Self-pay | Admitting: Hematology and Oncology

## 2017-11-20 ENCOUNTER — Telehealth: Payer: Self-pay | Admitting: *Deleted

## 2017-11-20 ENCOUNTER — Inpatient Hospital Stay: Payer: BLUE CROSS/BLUE SHIELD | Attending: Hematology and Oncology

## 2017-11-20 ENCOUNTER — Inpatient Hospital Stay: Payer: BLUE CROSS/BLUE SHIELD

## 2017-11-20 ENCOUNTER — Inpatient Hospital Stay (HOSPITAL_BASED_OUTPATIENT_CLINIC_OR_DEPARTMENT_OTHER): Payer: BLUE CROSS/BLUE SHIELD | Admitting: Hematology and Oncology

## 2017-11-20 ENCOUNTER — Other Ambulatory Visit: Payer: Self-pay | Admitting: *Deleted

## 2017-11-20 ENCOUNTER — Encounter: Payer: Self-pay | Admitting: *Deleted

## 2017-11-20 VITALS — BP 117/77 | HR 82 | Temp 98.5°F | Resp 18

## 2017-11-20 DIAGNOSIS — Z5112 Encounter for antineoplastic immunotherapy: Secondary | ICD-10-CM | POA: Insufficient documentation

## 2017-11-20 DIAGNOSIS — D649 Anemia, unspecified: Secondary | ICD-10-CM

## 2017-11-20 DIAGNOSIS — C50412 Malignant neoplasm of upper-outer quadrant of left female breast: Secondary | ICD-10-CM | POA: Insufficient documentation

## 2017-11-20 DIAGNOSIS — N920 Excessive and frequent menstruation with regular cycle: Secondary | ICD-10-CM | POA: Diagnosis not present

## 2017-11-20 DIAGNOSIS — Z95828 Presence of other vascular implants and grafts: Secondary | ICD-10-CM

## 2017-11-20 DIAGNOSIS — D6481 Anemia due to antineoplastic chemotherapy: Secondary | ICD-10-CM

## 2017-11-20 DIAGNOSIS — Z170 Estrogen receptor positive status [ER+]: Principal | ICD-10-CM

## 2017-11-20 DIAGNOSIS — D500 Iron deficiency anemia secondary to blood loss (chronic): Secondary | ICD-10-CM

## 2017-11-20 DIAGNOSIS — Z5111 Encounter for antineoplastic chemotherapy: Secondary | ICD-10-CM | POA: Diagnosis present

## 2017-11-20 DIAGNOSIS — N200 Calculus of kidney: Secondary | ICD-10-CM

## 2017-11-20 LAB — COMPREHENSIVE METABOLIC PANEL
ALT: 24 U/L (ref 0–55)
AST: 22 U/L (ref 5–34)
Albumin: 4.1 g/dL (ref 3.5–5.0)
Alkaline Phosphatase: 122 U/L (ref 40–150)
Anion gap: 8 (ref 3–11)
BUN: 11 mg/dL (ref 7–26)
CHLORIDE: 108 mmol/L (ref 98–109)
CO2: 25 mmol/L (ref 22–29)
Calcium: 8.8 mg/dL (ref 8.4–10.4)
Creatinine, Ser: 0.82 mg/dL (ref 0.60–1.10)
GFR calc Af Amer: >60 mL/min (ref >60)
Glucose, Bld: 118 mg/dL (ref 70–140)
POTASSIUM: 3.4 mmol/L — AB (ref 3.5–5.1)
Sodium: 141 mmol/L (ref 136–145)
Total Bilirubin: 0.4 mg/dL (ref 0.2–1.2)
Total Protein: 6.6 g/dL (ref 6.4–8.3)

## 2017-11-20 LAB — CBC WITH DIFFERENTIAL/PLATELET
BAND NEUTROPHILS: 0 %
BASOS ABS: 0 10*3/uL (ref 0.0–0.1)
BLASTS: 0 %
Basophils Relative: 0 %
EOS ABS: 0 10*3/uL (ref 0.0–0.5)
Eosinophils Relative: 1 %
HCT: 22.7 % — ABNORMAL LOW (ref 34.8–46.6)
Hemoglobin: 7.8 g/dL — ABNORMAL LOW (ref 11.6–15.9)
LYMPHS PCT: 37 %
Lymphs Abs: 1.2 10*3/uL (ref 0.9–3.3)
MCH: 36.4 pg — ABNORMAL HIGH (ref 25.1–34.0)
MCHC: 34.4 g/dL (ref 31.5–36.0)
MCV: 106.1 fL — AB (ref 79.5–101.0)
METAMYELOCYTES PCT: 0 %
MONO ABS: 0.5 10*3/uL (ref 0.1–0.9)
MONOS PCT: 14 %
Myelocytes: 0 %
Neutro Abs: 1.6 10*3/uL (ref 1.5–6.5)
Neutrophils Relative %: 48 %
OTHER: 0 %
PLATELETS: 158 10*3/uL (ref 145–400)
Promyelocytes Relative: 0 %
RBC: 2.14 MIL/uL — ABNORMAL LOW (ref 3.70–5.45)
RDW: 16.7 % — AB (ref 11.2–14.5)
WBC: 3.3 10*3/uL — ABNORMAL LOW (ref 3.9–10.3)
nRBC: 0 /100 WBC

## 2017-11-20 LAB — PREPARE RBC (CROSSMATCH)

## 2017-11-20 LAB — ABO/RH: ABO/RH(D): A POS

## 2017-11-20 MED ORDER — HEPARIN SOD (PORK) LOCK FLUSH 100 UNIT/ML IV SOLN
500.0000 [IU] | Freq: Once | INTRAVENOUS | Status: AC | PRN
  Administered 2017-11-20: 500 [IU]
  Filled 2017-11-20: qty 5

## 2017-11-20 MED ORDER — PALONOSETRON HCL INJECTION 0.25 MG/5ML
INTRAVENOUS | Status: AC
  Filled 2017-11-20: qty 5

## 2017-11-20 MED ORDER — SODIUM CHLORIDE 0.9 % IV SOLN
688.2000 mg | Freq: Once | INTRAVENOUS | Status: AC
  Administered 2017-11-20: 690 mg via INTRAVENOUS
  Filled 2017-11-20: qty 69

## 2017-11-20 MED ORDER — SODIUM CHLORIDE 0.9 % IV SOLN
Freq: Once | INTRAVENOUS | Status: AC
  Administered 2017-11-20: 10:00:00 via INTRAVENOUS

## 2017-11-20 MED ORDER — ACETAMINOPHEN 325 MG PO TABS: 650.0000 mg | ORAL_TABLET | Freq: Once | ORAL | Status: DC

## 2017-11-20 MED ORDER — DIPHENHYDRAMINE HCL 25 MG PO CAPS
50.0000 mg | ORAL_CAPSULE | Freq: Once | ORAL | Status: AC
  Administered 2017-11-20: 50 mg via ORAL

## 2017-11-20 MED ORDER — SODIUM CHLORIDE 0.9% FLUSH
10.0000 mL | INTRAVENOUS | Status: DC | PRN
  Administered 2017-11-20: 10 mL via INTRAVENOUS
  Filled 2017-11-20: qty 10

## 2017-11-20 MED ORDER — ACETAMINOPHEN 325 MG PO TABS
650.0000 mg | ORAL_TABLET | Freq: Once | ORAL | Status: AC
  Administered 2017-11-20: 650 mg via ORAL

## 2017-11-20 MED ORDER — SODIUM CHLORIDE 0.9% FLUSH
10.0000 mL | INTRAVENOUS | Status: AC | PRN
  Administered 2017-11-20: 10 mL
  Filled 2017-11-20: qty 10

## 2017-11-20 MED ORDER — ACETAMINOPHEN 325 MG PO TABS
ORAL_TABLET | ORAL | Status: AC
  Filled 2017-11-20: qty 2

## 2017-11-20 MED ORDER — TRASTUZUMAB CHEMO 150 MG IV SOLR
6.0000 mg/kg | Freq: Once | INTRAVENOUS | Status: AC
  Administered 2017-11-20: 378 mg via INTRAVENOUS
  Filled 2017-11-20: qty 18

## 2017-11-20 MED ORDER — SODIUM CHLORIDE 0.9 % IV SOLN
250.0000 mL | Freq: Once | INTRAVENOUS | Status: AC
  Administered 2017-11-20: 250 mL via INTRAVENOUS

## 2017-11-20 MED ORDER — SODIUM CHLORIDE 0.9% FLUSH
10.0000 mL | INTRAVENOUS | Status: DC | PRN
  Filled 2017-11-20: qty 10

## 2017-11-20 MED ORDER — DIPHENHYDRAMINE HCL 25 MG PO CAPS: 25.0000 mg | ORAL_CAPSULE | Freq: Once | ORAL | Status: DC

## 2017-11-20 MED ORDER — PALONOSETRON HCL INJECTION 0.25 MG/5ML
0.2500 mg | Freq: Once | INTRAVENOUS | Status: AC
  Administered 2017-11-20: 0.25 mg via INTRAVENOUS

## 2017-11-20 MED ORDER — SODIUM CHLORIDE 0.9 % IV SOLN
Freq: Once | INTRAVENOUS | Status: AC
  Administered 2017-11-20: 10:00:00 via INTRAVENOUS
  Filled 2017-11-20: qty 5

## 2017-11-20 MED ORDER — PERTUZUMAB CHEMO INJECTION 420 MG/14ML
420.0000 mg | Freq: Once | INTRAVENOUS | Status: AC
  Administered 2017-11-20: 420 mg via INTRAVENOUS
  Filled 2017-11-20: qty 14

## 2017-11-20 MED ORDER — DIPHENHYDRAMINE HCL 25 MG PO CAPS
ORAL_CAPSULE | ORAL | Status: AC
  Filled 2017-11-20: qty 2

## 2017-11-20 NOTE — Telephone Encounter (Signed)
Per MD review of lab with heme of 7.8 - order given to proceed with chemo today.  Request given to transfuse 1 unit PRBCs today or tomorrow.  This RN contacted treatment room per above. Orders entered and lab contacted to send tubes to treatment room for draw of [place].

## 2017-11-20 NOTE — Assessment & Plan Note (Signed)
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 Adjuvant TCH Perjeta followed by Herceptin and Perjeta maintenance for 1 year 2. followed by adjuvant antiestrogen therapy -------------------------------------------------------------------- Current treatment: [place] Perjeta(Taxotere discontinued from cycle 2 due to severe rash)  Chemo toxicities: Mouth sores: Magic mouthwash Anemia: Due to heavy menstrual cycles and kidney stone and chemotherapy Taxotere discontinued due to severe rash  In about a month she will start antiestrogen therapy.  Return to clinic every 3 weeks for Herceptin Perjeta maintenance and every 6 weeks for follow-up with me.

## 2017-11-20 NOTE — Patient Instructions (Addendum)
[facility] Discharge Instructions for Patients Receiving Chemotherapy  Today you received the following chemotherapy agents Herceptin, Perjeta, and Carboplatin.   To help prevent nausea and vomiting after your treatment, we encourage you to take your nausea medication as directed.   If you develop nausea and vomiting that is not controlled by your nausea medication, call the clinic.   BELOW ARE SYMPTOMS THAT SHOULD BE REPORTED IMMEDIATELY:  *FEVER GREATER THAN 100.5 F  *CHILLS WITH OR WITHOUT FEVER  NAUSEA AND VOMITING THAT IS NOT CONTROLLED WITH YOUR NAUSEA MEDICATION  *UNUSUAL SHORTNESS OF BREATH  *UNUSUAL BRUISING OR BLEEDING  TENDERNESS IN MOUTH AND THROAT WITH OR WITHOUT PRESENCE OF ULCERS  *URINARY PROBLEMS  *BOWEL PROBLEMS  UNUSUAL RASH Items with * indicate a potential emergency and should be followed up as soon as possible.  Feel free to call the clinic should you have any questions or concerns. The clinic phone number is [phone].  Please show the [facility] at check-in to the Emergency Department and triage nurse.   Blood Transfusion, Adult, Care After This sheet gives you information about how to care for yourself after your procedure. Your health care provider may also give you more specific instructions. If you have problems or questions, contact your health care provider. What can I expect after the procedure? After your procedure, it is common to have:  Bruising and soreness where the IV tube was inserted.  Headache.  Follow these instructions at home:  Take over-the-counter and prescription medicines only as told by your health care provider.  Return to your normal activities as told by your health care provider.  Follow instructions from your health care provider about how to take care of your IV insertion site. Make sure you: ? Wash your hands with soap and water before you change your bandage (dressing). If soap and  water are not available, use hand sanitizer. ? Change your dressing as told by your health care provider.  Check your IV insertion site every day for signs of infection. Check for: ? More redness, swelling, or pain. ? More fluid or blood. ? Warmth. ? Pus or a bad smell. Contact a health care provider if:  You have more redness, swelling, or pain around the IV insertion site.  You have more fluid or blood coming from the IV insertion site.  Your IV insertion site feels warm to the touch.  You have pus or a bad smell coming from the IV insertion site.  Your urine turns pink, red, or brown.  You feel weak after doing your normal activities. Get help right away if:  You have signs of a serious allergic or immune system reaction, including: ? Itchiness. ? Hives. ? Trouble breathing. ? Anxiety. ? Chest or lower back pain. ? Fever, flushing, and chills. ? Rapid pulse. ? Rash. ? Diarrhea. ? Vomiting. ? Dark urine. ? Serious headache. ? Dizziness. ? Stiff neck. ? Yellow coloration of the face or the white parts of the eyes (jaundice). This information is not intended to replace advice given to you by your health care provider. Make sure you discuss any questions you have with your health care provider. Document Released: 07/17/2014 Document Revised: 02/23/2016 Document Reviewed: 01/10/2016 Elsevier Interactive Patient Education  [employer].

## 2017-11-20 NOTE — Telephone Encounter (Signed)
Gave patient AVs and calendar of upcoming appointments.  °

## 2017-11-20 NOTE — Progress Notes (Signed)
Patient Care Team: Carol Ada, MD as PCP - General (Family Medicine)  DIAGNOSIS:  Encounter Diagnosis  Name Primary?  . Malignant neoplasm of upper-outer quadrant of left breast in female, estrogen receptor positive ([name])     SUMMARY OF ONCOLOGIC HISTORY:   Malignant neoplasm of upper-outer quadrant of left breast in female, estrogen receptor positive ([name])   04/16/2017 Initial Diagnosis    Screening mammogram detected calcifications left breast UOQ 9.3 cm, at 2:00: 1.4 cm and at 1:00: 8 mm; biopsy of mass at 1:00: IDC grade 2 with DCIS ER 95%, PR 90%,Ki-67 45%, HER-2 positive ratio 5.18; iopsy of the 2:00 mass and calcifications were DCIS with suspicion for microinvasion, T1 cN0 stage IA clinical stage AJCC 8      05/09/2017 Genetic Testing    The patient had genetic testing due to a personal and family history of breast cancer.  The Common Hereditary Cancer Panel was ordered.  The Hereditary Gene Panel offered by [facility] includes sequencing and/or deletion duplication testing of the following 47 genes: APC, ATM, AXIN2, BARD1, BMPR1A, BRCA1, BRCA2, BRIP1, CDH1, CDKN2A (p14ARF), CDKN2A (p16INK4a), CKD4, CHEK2, CTNNA1, DICER1, EPCAM (Deletion/duplication testing only), GREM1 (promoter region deletion/duplication testing only), KIT, MEN1, MLH1, MSH2, MSH3, MSH6, MUTYH, NBN, NF1, NHTL1, PALB2, PDGFRA, PMS2, POLD1, POLE, PTEN, RAD50, RAD51C, RAD51D, SDHB, SDHC, SDHD, SMAD4, SMARCA4. STK11, TP53, TSC1, TSC2, and VHL.  The following genes were evaluated for sequence changes only: SDHA and HOXB13 c.251G>A variant only.  Results- No pathogenic variants identified.  A Variant of Uncertain Significance was identified in the gene APC c.-[id number] (Promoter 1B).  The date of this test report is 05/09/2017.       06/20/2017 Surgery    Left mastectomy: Multifocal IDC with DCIS tumor size is 1.1, 1.6 and 2.4 cm, margins negative, 0/5 lymph nodes negative, ER 95%, PR 90%, HER-2 positive ratio 5.18,  Ki-67 45% T2N0 stage Ib pathologic stage      07/24/2017 -  Chemotherapy    TCH P followed by Herceptin and Perjeta maintenance        CHIEF COMPLIANT: Cycle 6 of chemotherapy  INTERVAL HISTORY: Alexis Underwood is a 41 year old with above-mentioned history left breast cancer treated with mastectomy is currently on adjuvant chemotherapy.  Taxotere was discontinued because of severe rash.  She is tolerating the rest of the chemotherapy and Herceptin and Perjeta very well.  She had mild sore throat which she used Magic mouthwash.  She continues to have some fatigue.  Patient passed a kidney stone.  Patient gets loose stools once a day and Imodium appears to be helping her.  Patient is experiencing shortness of breath to minimal exertion.  REVIEW OF SYSTEMS:   Constitutional: Denies fevers, chills or abnormal weight loss Eyes: Denies blurriness of vision Ears, nose, mouth, throat, and face: Denies mucositis or sore throat Respiratory: Shortness of breath to minimal exertion Cardiovascular: Denies palpitation, chest discomfort Gastrointestinal: Diarrhea daily Skin: Denies abnormal skin rashes Lymphatics: Denies new lymphadenopathy or easy bruising Neurological:Denies numbness, tingling or new weaknesses Behavioral/Psych: Mood is stable, no new changes  Extremities: No lower extremity edema  All other systems were reviewed with the patient and are negative.  I have reviewed the past medical history, past surgical history, social history and family history with the patient and they are unchanged from previous note.  ALLERGIES:  has No Known Allergies.  MEDICATIONS:  Current Outpatient Medications  Medication Sig Dispense Refill  . acetaminophen (TYLENOL) 325 MG tablet Take 650 mg  by mouth daily as needed for headache.    . lidocaine-prilocaine (EMLA) cream Apply 1 application topically daily as needed (port access).     Marland Kitchen lovastatin (MEVACOR) 40 MG tablet Take 40 mg by mouth every  evening.    . magic mouthwash w/lidocaine SOLN Take 5 mLs by mouth 4 (four) times daily as needed for mouth pain. 240 mL 0  . pantoprazole (PROTONIX) 40 MG tablet Take 1 tablet (40 mg total) by mouth daily. 30 tablet 3  . tamoxifen (NOLVADEX) 20 MG tablet Take 1 tablet (20 mg total) by mouth daily. (Patient not taking: Reported on 10/01/2017) 30 tablet 0   No current facility-administered medications for this visit.     PHYSICAL EXAMINATION: ECOG PERFORMANCE STATUS: 1 - Symptomatic but completely ambulatory  Vitals:   11/20/17 0845  BP: 140/87  Pulse: 83  Resp: 17  Temp: 97.8 F (36.6 C)  SpO2: 100%   Filed Weights   11/20/17 0845  Weight: 141 lb 11.2 oz (64.3 kg)    GENERAL:alert, no distress and comfortable SKIN: skin color, texture, turgor are normal, no rashes or significant lesions EYES: normal, Conjunctiva are pink and non-injected, sclera clear OROPHARYNX:no exudate, no erythema and lips, buccal mucosa, and tongue normal  NECK: supple, thyroid normal size, non-tender, without nodularity LYMPH:  no palpable lymphadenopathy in the cervical, axillary or inguinal LUNGS: clear to auscultation and percussion with normal breathing effort HEART: regular rate & rhythm and no murmurs and no lower extremity edema ABDOMEN:abdomen soft, non-tender and normal bowel sounds MUSCULOSKELETAL:no cyanosis of digits and no clubbing  NEURO: alert & oriented x 3 with fluent speech, no focal motor/sensory deficits EXTREMITIES: No lower extremity edema  LABORATORY DATA:  I have reviewed the data as listed CMP Latest Ref Rng & Units 10/30/2017 10/09/2017 09/21/2017  Glucose 70 - 140 mg/dL 142(H) 135 160(H)  BUN 7 - 26 mg/dL '10 11 15  '$ Creatinine 0.60 - 1.10 mg/dL 0.81 0.94 0.80  Sodium 136 - 145 mmol/L 141 141 138  Potassium 3.5 - 5.1 mmol/L 3.6 3.2(L) 3.7  Chloride 98 - 109 mmol/L 106 104 106  CO2 22 - 29 mmol/L '24 27 24  '$ Calcium 8.4 - 10.4 mg/dL 9.2 8.9 8.9  Total Protein 6.4 - 8.3 g/dL  6.3(L) 6.5 6.7  Total Bilirubin 0.2 - 1.2 mg/dL 0.3 0.3 0.4  Alkaline Phos 40 - 150 U/L 125 118 113  AST 5 - 34 U/L '25 17 17  '$ ALT 0 - 55 U/L 34 18 19    Lab Results  Component Value Date   WBC 3.3 (L) 11/20/2017   HGB 7.8 (L) 11/20/2017   HCT 22.7 (L) 11/20/2017   MCV 106.1 (H) 11/20/2017   PLT 158 11/20/2017   NEUTROABS 1.6 11/20/2017    ASSESSMENT & PLAN:  Malignant neoplasm of upper-outer quadrant of left breast in female, estrogen receptor positive ([place]) 06/20/2017 left mastectomy: 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 Adjuvant TCH Perjeta followed by Herceptin and Perjeta maintenance for 1 year 2. followed by adjuvant antiestrogen therapy -------------------------------------------------------------------- Current treatment: [place] Perjeta(Taxotere discontinued from cycle 2 due to severe rash)  Chemo toxicities: Mouth sores: Magic mouthwash: It appears to have healed Anemia: Due to heavy menstrual cycles and kidney stone and chemotherapy today's hemoglobin is 7.8 and she will need blood transfusion.  We  will request 1 unit of PRBC to be given today. Taxotere discontinued due to severe rash  kidney stones:She passed some finally  In about a month she will start antiestrogen therapy with tamoxifen 20 mg daily.  Previously she had taken tamoxifen and appears to have tolerated it fairly well.  Return to clinic every 3 weeks for Herceptin Perjeta maintenance and every 6 weeks for follow-up with me.  No orders of the defined types were placed in this encounter.  The patient has a good understanding of the overall plan. she agrees with it. she will call with any problems that may develop before the next visit here.   Harriette Ohara, MD 11/20/17

## 2017-11-21 LAB — TYPE AND SCREEN
ABO/RH(D): A POS
Antibody Screen: NEGATIVE
UNIT DIVISION: 0

## 2017-11-21 LAB — BPAM RBC
Blood Product Expiration Date: 201906172359
ISSUE DATE / TIME: 201905141335
UNIT TYPE AND RH: 600

## 2017-12-11 ENCOUNTER — Inpatient Hospital Stay: Payer: BLUE CROSS/BLUE SHIELD | Attending: Hematology and Oncology

## 2017-12-11 VITALS — BP 135/87 | HR 92 | Temp 98.3°F | Resp 18

## 2017-12-11 DIAGNOSIS — Z9012 Acquired absence of left breast and nipple: Secondary | ICD-10-CM | POA: Insufficient documentation

## 2017-12-11 DIAGNOSIS — Z5112 Encounter for antineoplastic immunotherapy: Secondary | ICD-10-CM | POA: Insufficient documentation

## 2017-12-11 DIAGNOSIS — C50412 Malignant neoplasm of upper-outer quadrant of left female breast: Secondary | ICD-10-CM | POA: Insufficient documentation

## 2017-12-11 DIAGNOSIS — T451X5A Adverse effect of antineoplastic and immunosuppressive drugs, initial encounter: Secondary | ICD-10-CM | POA: Diagnosis not present

## 2017-12-11 DIAGNOSIS — D6481 Anemia due to antineoplastic chemotherapy: Secondary | ICD-10-CM | POA: Insufficient documentation

## 2017-12-11 DIAGNOSIS — Z9221 Personal history of antineoplastic chemotherapy: Secondary | ICD-10-CM | POA: Diagnosis not present

## 2017-12-11 DIAGNOSIS — Z170 Estrogen receptor positive status [ER+]: Secondary | ICD-10-CM | POA: Insufficient documentation

## 2017-12-11 DIAGNOSIS — D500 Iron deficiency anemia secondary to blood loss (chronic): Secondary | ICD-10-CM | POA: Insufficient documentation

## 2017-12-11 DIAGNOSIS — N920 Excessive and frequent menstruation with regular cycle: Secondary | ICD-10-CM | POA: Insufficient documentation

## 2017-12-11 DIAGNOSIS — N200 Calculus of kidney: Secondary | ICD-10-CM | POA: Insufficient documentation

## 2017-12-11 MED ORDER — TRASTUZUMAB CHEMO 150 MG IV SOLR
6.0000 mg/kg | Freq: Once | INTRAVENOUS | Status: AC
  Administered 2017-12-11: 378 mg via INTRAVENOUS
  Filled 2017-12-11: qty 18

## 2017-12-11 MED ORDER — DIPHENHYDRAMINE HCL 25 MG PO CAPS
50.0000 mg | ORAL_CAPSULE | Freq: Once | ORAL | Status: AC
  Administered 2017-12-11: 50 mg via ORAL

## 2017-12-11 MED ORDER — ACETAMINOPHEN 325 MG PO TABS
ORAL_TABLET | ORAL | Status: AC
  Filled 2017-12-11: qty 2

## 2017-12-11 MED ORDER — SODIUM CHLORIDE 0.9 % IV SOLN
420.0000 mg | Freq: Once | INTRAVENOUS | Status: AC
  Administered 2017-12-11: 420 mg via INTRAVENOUS
  Filled 2017-12-11: qty 14

## 2017-12-11 MED ORDER — SODIUM CHLORIDE 0.9% FLUSH
10.0000 mL | INTRAVENOUS | Status: DC | PRN
  Administered 2017-12-11: 10 mL
  Filled 2017-12-11: qty 10

## 2017-12-11 MED ORDER — ACETAMINOPHEN 325 MG PO TABS
650.0000 mg | ORAL_TABLET | Freq: Once | ORAL | Status: AC
  Administered 2017-12-11: 650 mg via ORAL

## 2017-12-11 MED ORDER — HEPARIN SOD (PORK) LOCK FLUSH 100 UNIT/ML IV SOLN
500.0000 [IU] | Freq: Once | INTRAVENOUS | Status: AC | PRN
  Administered 2017-12-11: 500 [IU]
  Filled 2017-12-11: qty 5

## 2017-12-11 MED ORDER — SODIUM CHLORIDE 0.9 % IV SOLN
Freq: Once | INTRAVENOUS | Status: AC
  Administered 2017-12-11: 08:00:00 via INTRAVENOUS

## 2017-12-11 MED ORDER — DIPHENHYDRAMINE HCL 25 MG PO CAPS
ORAL_CAPSULE | ORAL | Status: AC
  Filled 2017-12-11: qty 2

## 2017-12-11 NOTE — Patient Instructions (Signed)
[facility] Discharge Instructions for Patients Receiving Chemotherapy  Today you received the following chemotherapy agents: Herceptin, Perjeta  To help prevent nausea and vomiting after your treatment, we encourage you to take your nausea medication as directed.   If you develop nausea and vomiting that is not controlled by your nausea medication, call the clinic.   BELOW ARE SYMPTOMS THAT SHOULD BE REPORTED IMMEDIATELY:  *FEVER GREATER THAN 100.5 F  *CHILLS WITH OR WITHOUT FEVER  NAUSEA AND VOMITING THAT IS NOT CONTROLLED WITH YOUR NAUSEA MEDICATION  *UNUSUAL SHORTNESS OF BREATH  *UNUSUAL BRUISING OR BLEEDING  TENDERNESS IN MOUTH AND THROAT WITH OR WITHOUT PRESENCE OF ULCERS  *URINARY PROBLEMS  *BOWEL PROBLEMS  UNUSUAL RASH Items with * indicate a potential emergency and should be followed up as soon as possible.  Feel free to call the clinic should you have any questions or concerns. The clinic phone number is [phone].  Please show the CHEMO ALERT CARD at check-in to the Emergency Department and triage nurse.   

## 2017-12-26 ENCOUNTER — Encounter (HOSPITAL_COMMUNITY): Payer: BLUE CROSS/BLUE SHIELD | Admitting: Internal Medicine

## 2017-12-26 ENCOUNTER — Other Ambulatory Visit (HOSPITAL_COMMUNITY): Payer: BLUE CROSS/BLUE SHIELD

## 2018-01-01 ENCOUNTER — Telehealth: Payer: Self-pay | Admitting: Hematology and Oncology

## 2018-01-01 ENCOUNTER — Inpatient Hospital Stay: Payer: BLUE CROSS/BLUE SHIELD

## 2018-01-01 ENCOUNTER — Inpatient Hospital Stay (HOSPITAL_BASED_OUTPATIENT_CLINIC_OR_DEPARTMENT_OTHER): Payer: BLUE CROSS/BLUE SHIELD | Admitting: Hematology and Oncology

## 2018-01-01 ENCOUNTER — Telehealth: Payer: Self-pay

## 2018-01-01 DIAGNOSIS — D6481 Anemia due to antineoplastic chemotherapy: Secondary | ICD-10-CM

## 2018-01-01 DIAGNOSIS — N920 Excessive and frequent menstruation with regular cycle: Secondary | ICD-10-CM | POA: Diagnosis not present

## 2018-01-01 DIAGNOSIS — D500 Iron deficiency anemia secondary to blood loss (chronic): Secondary | ICD-10-CM | POA: Diagnosis not present

## 2018-01-01 DIAGNOSIS — C50412 Malignant neoplasm of upper-outer quadrant of left female breast: Secondary | ICD-10-CM | POA: Diagnosis not present

## 2018-01-01 DIAGNOSIS — N200 Calculus of kidney: Secondary | ICD-10-CM

## 2018-01-01 DIAGNOSIS — Z9012 Acquired absence of left breast and nipple: Secondary | ICD-10-CM

## 2018-01-01 DIAGNOSIS — Z5112 Encounter for antineoplastic immunotherapy: Secondary | ICD-10-CM | POA: Diagnosis not present

## 2018-01-01 DIAGNOSIS — Z170 Estrogen receptor positive status [ER+]: Secondary | ICD-10-CM

## 2018-01-01 DIAGNOSIS — Z9221 Personal history of antineoplastic chemotherapy: Secondary | ICD-10-CM

## 2018-01-01 DIAGNOSIS — T451X5A Adverse effect of antineoplastic and immunosuppressive drugs, initial encounter: Secondary | ICD-10-CM

## 2018-01-01 DIAGNOSIS — Z95828 Presence of other vascular implants and grafts: Secondary | ICD-10-CM

## 2018-01-01 LAB — CBC WITH DIFFERENTIAL/PLATELET
Basophils Absolute: 0 10*3/uL (ref 0.0–0.1)
Basophils Relative: 1 %
EOS ABS: 0.1 10*3/uL (ref 0.0–0.5)
Eosinophils Relative: 2 %
HCT: 30.7 % — ABNORMAL LOW (ref 34.8–46.6)
HEMOGLOBIN: 10.9 g/dL — AB (ref 11.6–15.9)
Lymphocytes Relative: 29 %
Lymphs Abs: 1.4 10*3/uL (ref 0.9–3.3)
MCH: 37 pg — AB (ref 25.1–34.0)
MCHC: 35.6 g/dL (ref 31.5–36.0)
MCV: 103.8 fL — ABNORMAL HIGH (ref 79.5–101.0)
Monocytes Absolute: 0.6 10*3/uL (ref 0.1–0.9)
Monocytes Relative: 12 %
NEUTROS PCT: 56 %
Neutro Abs: 2.6 10*3/uL (ref 1.5–6.5)
Platelets: 289 10*3/uL (ref 145–400)
RBC: 2.96 MIL/uL — ABNORMAL LOW (ref 3.70–5.45)
RDW: 20.6 % — ABNORMAL HIGH (ref 11.2–14.5)
WBC: 4.7 10*3/uL (ref 3.9–10.3)

## 2018-01-01 LAB — COMPREHENSIVE METABOLIC PANEL
ALK PHOS: 134 U/L — AB (ref 38–126)
ALT: 43 U/L (ref 0–44)
ANION GAP: 11 (ref 5–15)
AST: 34 U/L (ref 15–41)
Albumin: 4.3 g/dL (ref 3.5–5.0)
BILIRUBIN TOTAL: 0.4 mg/dL (ref 0.3–1.2)
BUN: 12 mg/dL (ref 6–20)
CALCIUM: 9.7 mg/dL (ref 8.9–10.3)
CO2: 27 mmol/L (ref 22–32)
Chloride: 104 mmol/L (ref 98–111)
Creatinine, Ser: 0.82 mg/dL (ref 0.44–1.00)
GFR calc non Af Amer: >60 mL/min (ref >60)
Glucose, Bld: 112 mg/dL — ABNORMAL HIGH (ref 70–99)
POTASSIUM: 3.5 mmol/L (ref 3.5–5.1)
SODIUM: 142 mmol/L (ref 135–145)
TOTAL PROTEIN: 6.9 g/dL (ref 6.5–8.1)

## 2018-01-01 MED ORDER — ACETAMINOPHEN 325 MG PO TABS
ORAL_TABLET | ORAL | Status: AC
  Filled 2018-01-01: qty 2

## 2018-01-01 MED ORDER — DIPHENHYDRAMINE HCL 25 MG PO CAPS
50.0000 mg | ORAL_CAPSULE | Freq: Once | ORAL | Status: AC
  Administered 2018-01-01: 25 mg via ORAL

## 2018-01-01 MED ORDER — HEPARIN SOD (PORK) LOCK FLUSH 100 UNIT/ML IV SOLN
500.0000 [IU] | Freq: Once | INTRAVENOUS | Status: AC | PRN
  Administered 2018-01-01: 500 [IU]
  Filled 2018-01-01: qty 5

## 2018-01-01 MED ORDER — SODIUM CHLORIDE 0.9 % IV SOLN
Freq: Once | INTRAVENOUS | Status: AC
  Administered 2018-01-01: 11:00:00 via INTRAVENOUS

## 2018-01-01 MED ORDER — ACETAMINOPHEN 325 MG PO TABS
650.0000 mg | ORAL_TABLET | Freq: Once | ORAL | Status: AC
  Administered 2018-01-01: 650 mg via ORAL

## 2018-01-01 MED ORDER — DIPHENHYDRAMINE HCL 25 MG PO CAPS
ORAL_CAPSULE | ORAL | Status: AC
  Filled 2018-01-01: qty 1

## 2018-01-01 MED ORDER — SODIUM CHLORIDE 0.9% FLUSH
10.0000 mL | INTRAVENOUS | Status: DC | PRN
  Administered 2018-01-01: 10 mL via INTRAVENOUS
  Filled 2018-01-01: qty 10

## 2018-01-01 MED ORDER — TRASTUZUMAB CHEMO 150 MG IV SOLR
6.0000 mg/kg | Freq: Once | INTRAVENOUS | Status: AC
  Administered 2018-01-01: 378 mg via INTRAVENOUS
  Filled 2018-01-01: qty 18

## 2018-01-01 MED ORDER — SODIUM CHLORIDE 0.9% FLUSH
10.0000 mL | INTRAVENOUS | Status: DC | PRN
  Administered 2018-01-01: 10 mL
  Filled 2018-01-01: qty 10

## 2018-01-01 MED ORDER — SODIUM CHLORIDE 0.9 % IV SOLN
420.0000 mg | Freq: Once | INTRAVENOUS | Status: AC
  Administered 2018-01-01: 420 mg via INTRAVENOUS
  Filled 2018-01-01: qty 14

## 2018-01-01 NOTE — Progress Notes (Signed)
Patient Care Team: Carol Ada, MD as PCP - General (Family Medicine)  DIAGNOSIS:  Encounter Diagnosis  Name Primary?  . Malignant neoplasm of upper-outer quadrant of left breast in female, estrogen receptor positive ([place])     SUMMARY OF ONCOLOGIC HISTORY:   Malignant neoplasm of upper-outer quadrant of left breast in female, estrogen receptor positive ([place])   04/16/2017 Initial Diagnosis    Screening mammogram detected calcifications left breast UOQ 9.3 cm, at 2:00: 1.4 cm and at 1:00: 8 mm; biopsy of mass at 1:00: IDC grade 2 with DCIS ER 95%, PR 90%,Ki-67 45%, HER-2 positive ratio 5.18; iopsy of the 2:00 mass and calcifications were DCIS with suspicion for microinvasion, T1 cN0 stage IA clinical stage AJCC 8      05/09/2017 Genetic Testing    The patient had genetic testing due to a personal and family history of breast cancer.  The Common Hereditary Cancer Panel was ordered.  The Hereditary Gene Panel offered by [facility] includes sequencing and/or deletion duplication testing of the following 47 genes: APC, ATM, AXIN2, BARD1, BMPR1A, BRCA1, BRCA2, BRIP1, CDH1, CDKN2A (p14ARF), CDKN2A (p16INK4a), CKD4, CHEK2, CTNNA1, DICER1, EPCAM (Deletion/duplication testing only), GREM1 (promoter region deletion/duplication testing only), KIT, MEN1, MLH1, MSH2, MSH3, MSH6, MUTYH, NBN, NF1, NHTL1, PALB2, PDGFRA, PMS2, POLD1, POLE, PTEN, RAD50, RAD51C, RAD51D, SDHB, SDHC, SDHD, SMAD4, SMARCA4. STK11, TP53, TSC1, TSC2, and VHL.  The following genes were evaluated for sequence changes only: SDHA and HOXB13 c.251G>A variant only.  Results- No pathogenic variants identified.  A Variant of Uncertain Significance was identified in the gene APC c.-30626C>G (Promoter 1B).  The date of this test report is 05/09/2017.       06/20/2017 Surgery    Left mastectomy: Multifocal IDC with DCIS tumor size is 1.1, 1.6 and 2.4 cm, margins negative, 0/5 lymph nodes negative, ER 95%, PR 90%, HER-2 positive ratio 5.18,  Ki-67 45% T2N0 stage Ib pathologic stage      07/24/2017 - 11/09/2017 Chemotherapy    TCH P followed by Herceptin and Perjeta maintenance ( density discontinued after cycle 2 because of profound rash)       CHIEF COMPLIANT: Herceptin and Perjeta maintenance  INTERVAL HISTORY: Alexis Underwood is a 42 year old with above-mentioned history of left breast cancer treated with mastectomy followed by adjuvant chemotherapy with [place].  Taxotere could not be given after cycle 2 but she was able to finish the rest of the chemotherapy without the Taxotere.  She is currently on Herceptin and Perjeta maintenance.  She is also here to [place] to discuss starting antiestrogen therapy with tamoxifen.  She is tolerating Herceptin and Perjeta without any major problems.  She do not longer has any mouth sores. After the last Herceptin and Perjeta she had restless legs as well as insomnia for 1 week.  This has not happened before.  REVIEW OF SYSTEMS:   Constitutional: Denies fevers, chills or abnormal weight loss Eyes: Denies blurriness of vision Ears, nose, mouth, throat, and face: Denies mucositis or sore throat Respiratory: Denies cough, dyspnea or wheezes Cardiovascular: Denies palpitation, chest discomfort Gastrointestinal:  Denies nausea, heartburn or change in bowel habits Skin: Denies abnormal skin rashes Lymphatics: Denies new lymphadenopathy or easy bruising Neurological:Denies numbness, tingling or new weaknesses Behavioral/Psych: Mood is stable, no new changes  Extremities: No lower extremity edema  All other systems were reviewed with the patient and are negative.  I have reviewed the past medical history, past surgical history, social history and family history with the patient  and they are unchanged from previous note.  ALLERGIES:  has No Known Allergies.  MEDICATIONS:  Current Outpatient Medications  Medication Sig Dispense Refill  . acetaminophen (TYLENOL) 325 MG tablet Take 650 mg by  mouth daily as needed for headache.    . lidocaine-prilocaine (EMLA) cream Apply 1 application topically daily as needed (port access).     Marland Kitchen lovastatin (MEVACOR) 40 MG tablet Take 40 mg by mouth every evening.    . magic mouthwash w/lidocaine SOLN Take 5 mLs by mouth 4 (four) times daily as needed for mouth pain. 240 mL 0  . pantoprazole (PROTONIX) 40 MG tablet Take 1 tablet (40 mg total) by mouth daily. 30 tablet 3  . tamoxifen (NOLVADEX) 20 MG tablet Take 1 tablet (20 mg total) by mouth daily. (Patient not taking: Reported on 10/01/2017) 30 tablet 0   No current facility-administered medications for this visit.    Facility-Administered Medications Ordered in Other Visits  Medication Dose Route Frequency Provider Last Rate Last Dose  . 0.9 %  sodium chloride infusion   Intravenous Once Nicholas Lose, MD      . acetaminophen (TYLENOL) tablet 650 mg  650 mg Oral Once Nicholas Lose, MD      . diphenhydrAMINE (BENADRYL) capsule 50 mg  50 mg Oral Once Nicholas Lose, MD      . heparin lock flush 100 unit/mL  500 Units Intracatheter Once PRN Nicholas Lose, MD      . pertuzumab (PERJETA) 420 mg in sodium chloride 0.9 % 250 mL chemo infusion  420 mg Intravenous Once Nicholas Lose, MD      . sodium chloride flush (NS) 0.9 % injection 10 mL  10 mL Intracatheter PRN Nicholas Lose, MD      . trastuzumab (HERCEPTIN) 378 mg in sodium chloride 0.9 % 250 mL chemo infusion  6 mg/kg (Treatment Plan Recorded) Intravenous Once Nicholas Lose, MD        PHYSICAL EXAMINATION: ECOG PERFORMANCE STATUS: 1 - Symptomatic but completely ambulatory  Vitals:   01/01/18 1012  BP: (!) 160/89  Pulse: 78  Resp: 18  Temp: 98 F (36.7 C)  SpO2: 100%   Filed Weights   01/01/18 1012  Weight: 142 lb 1.6 oz (64.5 kg)    GENERAL:alert, no distress and comfortable SKIN: skin color, texture, turgor are normal, no rashes or significant lesions EYES: normal, Conjunctiva are pink and non-injected, sclera  clear OROPHARYNX:no exudate, no erythema and lips, buccal mucosa, and tongue normal  NECK: supple, thyroid normal size, non-tender, without nodularity LYMPH:  no palpable lymphadenopathy in the cervical, axillary or inguinal LUNGS: clear to auscultation and percussion with normal breathing effort HEART: regular rate & rhythm and no murmurs and no lower extremity edema ABDOMEN:abdomen soft, non-tender and normal bowel sounds MUSCULOSKELETAL:no cyanosis of digits and no clubbing  NEURO: alert & oriented x 3 with fluent speech, no focal motor/sensory deficits EXTREMITIES: No lower extremity edema   LABORATORY DATA:  I have reviewed the data as listed CMP Latest Ref Rng & Units 01/01/2018 11/20/2017 10/30/2017  Glucose 70 - 99 mg/dL 112(H) 118 142(H)  BUN 6 - 20 mg/dL '12 11 10  '$ Creatinine 0.44 - 1.00 mg/dL 0.82 0.82 0.81  Sodium 135 - 145 mmol/L 142 141 141  Potassium 3.5 - 5.1 mmol/L 3.5 3.4(L) 3.6  Chloride 98 - 111 mmol/L 104 108 106  CO2 22 - 32 mmol/L '27 25 24  '$ Calcium 8.9 - 10.3 mg/dL 9.7 8.8 9.2  Total Protein 6.5 -  8.1 g/dL 6.9 6.6 6.3(L)  Total Bilirubin 0.3 - 1.2 mg/dL 0.4 0.4 0.3  Alkaline Phos 38 - 126 U/L 134(H) 122 125  AST 15 - 41 U/L 34 22 25  ALT 0 - 44 U/L 43 24 34    Lab Results  Component Value Date   WBC 4.7 01/01/2018   HGB 10.9 (L) 01/01/2018   HCT 30.7 (L) 01/01/2018   MCV 103.8 (H) 01/01/2018   PLT 289 01/01/2018   NEUTROABS 2.6 01/01/2018    ASSESSMENT & PLAN:  Malignant neoplasm of upper-outer quadrant of left breast in female, estrogen receptor positive ([place]) 06/20/2017 left mastectomy: 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 Adjuvant TCH Perjeta followed by Herceptin and Perjeta maintenance for 1 year 2. followed by adjuvant antiestrogen  therapy -------------------------------------------------------------------- Current treatment: Herceptin Perjeta maintenance We discussed about role of tamoxifen.  Because she is getting her breast reconstruction surgery this week as well as going on a vacation after that I recommended that we wait until she comes back from all of this to begin tamoxifen in about 6 weeks.  Anemia: Due to heavy menstrual cycles and kidney stone and chemotherapy, today's hemoglobin is 10.9.  She received blood transfusion 3 weeks ago.  Return to clinic every 3 weeks for Herceptin Perjeta maintenance and every 6 weeks for follow-up with me and we will then begin tamoxifen.  No orders of the defined types were placed in this encounter.  The patient has a good understanding of the overall plan. she agrees with it. she will call with any problems that may develop before the next visit here.   Harriette Ohara, MD 01/01/18

## 2018-01-01 NOTE — Telephone Encounter (Signed)
Merleen Nicely RN in infusion called- pt has Herceptin today and had her last echo on march 7th- ok to treat per Dr Lindi Adie and per him, order new echo and schedule for pt. Notified Magazine features editor. Merleen Nicely called back and pt is already scheduled for July 24th, told her to remind pt of appt.

## 2018-01-01 NOTE — Patient Instructions (Signed)
[facility] Discharge Instructions for Patients Receiving Chemotherapy  Today you received the following chemotherapy agents: Herceptin, Perjeta  To help prevent nausea and vomiting after your treatment, we encourage you to take your nausea medication as directed.   If you develop nausea and vomiting that is not controlled by your nausea medication, call the clinic.   BELOW ARE SYMPTOMS THAT SHOULD BE REPORTED IMMEDIATELY:  *FEVER GREATER THAN 100.5 F  *CHILLS WITH OR WITHOUT FEVER  NAUSEA AND VOMITING THAT IS NOT CONTROLLED WITH YOUR NAUSEA MEDICATION  *UNUSUAL SHORTNESS OF BREATH  *UNUSUAL BRUISING OR BLEEDING  TENDERNESS IN MOUTH AND THROAT WITH OR WITHOUT PRESENCE OF ULCERS  *URINARY PROBLEMS  *BOWEL PROBLEMS  UNUSUAL RASH Items with * indicate a potential emergency and should be followed up as soon as possible.  Feel free to call the clinic should you have any questions or concerns. The clinic phone number is [phone].  Please show the CHEMO ALERT CARD at check-in to the Emergency Department and triage nurse.   

## 2018-01-01 NOTE — Assessment & Plan Note (Signed)
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 Adjuvant TCH Perjeta followed by Herceptin and Perjeta maintenance for 1 year 2. followed by adjuvant antiestrogen therapy -------------------------------------------------------------------- Current treatment: Herceptin Perjeta maintenance I recommended that she start antiestrogen therapy with tamoxifen 20 mg daily.  Tamoxifen counseling: We discussed the risks and benefits of tamoxifen. These include but not limited to insomnia, hot flashes, mood changes, vaginal dryness, and weight gain. Although rare, serious side effects including endometrial cancer, risk of blood clots were also discussed. We strongly believe that the benefits far outweigh the risks. Patient understands these risks and consented to starting treatment. Planned treatment duration is 10 years.  Anemia: Due to heavy menstrual cycles and kidney stone and chemotherapy  Return to clinic every 3 weeks for Herceptin Perjeta maintenance and every 6 weeks for follow-up with me.

## 2018-01-01 NOTE — Telephone Encounter (Signed)
No 6/25 los °

## 2018-01-01 NOTE — Progress Notes (Signed)
Per Dr. Lindi Adie okay to treat pt with echo from 09/13/17.

## 2018-01-02 ENCOUNTER — Ambulatory Visit: Payer: Self-pay | Admitting: Plastic Surgery

## 2018-01-02 DIAGNOSIS — N651 Disproportion of reconstructed breast: Secondary | ICD-10-CM

## 2018-01-02 DIAGNOSIS — Z9012 Acquired absence of left breast and nipple: Secondary | ICD-10-CM

## 2018-01-04 ENCOUNTER — Ambulatory Visit: Payer: Self-pay | Admitting: Plastic Surgery

## 2018-01-04 NOTE — H&P (View-Only) (Signed)
Alexis Underwood is an 42 y.o. female.   Chief Complaint: acquired absence of breast and asymmetry HPI: Alexis Underwood is a 42 yo female here for pre operative history and physical prior to removal of left breast tissue expander and placement of left breast silicone implant and right breast reduction for symmetry.  She underwent left breast reconstruction with TE/ADM on 06/20/17.  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  The lesion area measures 9.3 cm. The biopsy sites are well healed.  She has grade II ptosis of both breasts.  The sternal notch to nipple on the left is 26 cm and 27 cm on the right.  She is 5 feet 1 inch tall.  Weight is 139 pounds.  Pre op bra is ~ 109 D.  She is interested in a left mastectomy with immediate reconstruction.  MRI bilateral breasts 05/29/17 showing left upper central and upper outer lesions and upper central right breast. Biopsy of right breast lesion showed- small intraductal papilloma and right breast lumpectomy at the time of the left mastectomy.   Past Medical History:  Diagnosis Date  . Breast cancer ([place])   . Family history of breast cancer   . Family history of throat cancer   . History of kidney stones   . Hyperlipidemia   . SVD (spontaneous vaginal delivery)     Past Surgical History:  Procedure Laterality Date  . BREAST BIOPSY Left 04/18/2017   Malignant  . BREAST BIOPSY Right 06/11/2017  . BREAST RECONSTRUCTION WITH PLACEMENT OF TISSUE EXPANDER AND FLEX HD (ACELLULAR HYDRATED DERMIS) Left 06/20/2017   Procedure: LEFT BREAST RECONSTRUCTION WITH PLACEMENT OF TISSUE EXPANDER AND FLEX HD (ACELLULAR HYDRATED DERMIS);  Surgeon: Wallace Going, DO;  Location: [facility];  Service: Plastics;  Laterality:  Left;  . COLONOSCOPY    . MASTECTOMY W/ SENTINEL NODE BIOPSY Left 06/20/2017  . MASTECTOMY W/ SENTINEL NODE BIOPSY Left 06/20/2017   Procedure: LEFT MASTECTOMY WITH LEFT SENTINEL LYMPH NODE BIOPSY;  Surgeon: Jovita Kussmaul, MD;  Location: [facility];  Service: General;  Laterality: Left;  . PORTACATH PLACEMENT Right 06/20/2017   Procedure: INSERTION PORT-A-CATH;  Surgeon: Jovita Kussmaul, MD;  Location: [facility];  Service: General;  Laterality: Right;  . WISDOM TOOTH EXTRACTION      Family History  Problem Relation Age of Onset  . Heart attack Mother   . Hyperlipidemia Mother   . Hypertension Mother   . Heart failure Mother   . Breast cancer Paternal Grandmother 68  . Heart disease Maternal Grandfather        died in 67's  . Throat cancer Paternal Grandfather 14   Social History:  reports that she has never smoked. She has never used smokeless tobacco. She reports that she does not drink alcohol or use drugs.  Allergies: No Known Allergies   (Not in a hospital admission)  No results found for this or any previous visit (from the past 48 hour(s)). No results found.  Review of Systems  Constitutional: Negative.   HENT: Negative.   Eyes: Negative.   Respiratory: Negative.   Cardiovascular: Negative.   Gastrointestinal: Negative.   Genitourinary: Negative.   Musculoskeletal: Negative.   Skin: Negative.   Neurological: Negative.   Endo/Heme/Allergies: Negative.  Psychiatric/Behavioral: Negative.     There were no vitals taken for this visit. Physical Exam  Constitutional: She appears well-developed and well-nourished.  HENT:  Head: Normocephalic and atraumatic.  Eyes: Pupils are equal, round, and reactive to light. Conjunctivae are normal.  Respiratory: Effort normal.  GI: Soft.  Skin: Skin is warm. No erythema.  Psychiatric: She has a normal mood and affect. Her behavior is normal. Judgment and thought content normal.     Assessment/Plan Left expander has a total of  550/375 cc Plan removal of left breast tissue expander and placement of left breast silicone implant and right breast reduction for symmetry.  The risks that can be encountered with and after placement of a breast implant placement were discussed.  [place], DO 01/04/2018, 7:12 AM

## 2018-01-04 NOTE — H&P (Signed)
Alexis Underwood is an 42 y.o. female.   Chief Complaint: acquired absence of breast and asymmetry HPI: Alexis Underwood is a 42 yo female here for pre operative history and physical prior to removal of left breast tissue expander and placement of left breast silicone implant and right breast reduction for symmetry.  She underwent left breast reconstruction with TE/ADM on 06/20/17.  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  The lesion area measures 9.3 cm. The biopsy sites are well healed.  She has grade II ptosis of both breasts.  The sternal notch to nipple on the left is 26 cm and 27 cm on the right.  She is 5 feet 1 inch tall.  Weight is 139 pounds.  Pre op bra is ~ 63 D.  She is interested in a left mastectomy with immediate reconstruction.  MRI bilateral breasts 05/29/17 showing left upper central and upper outer lesions and upper central right breast. Biopsy of right breast lesion showed- small intraductal papilloma and right breast lumpectomy at the time of the left mastectomy.   Past Medical History:  Diagnosis Date  . Breast cancer ([place])   . Family history of breast cancer   . Family history of throat cancer   . History of kidney stones   . Hyperlipidemia   . SVD (spontaneous vaginal delivery)     Past Surgical History:  Procedure Laterality Date  . BREAST BIOPSY Left 04/18/2017   Malignant  . BREAST BIOPSY Right 06/11/2017  . BREAST RECONSTRUCTION WITH PLACEMENT OF TISSUE EXPANDER AND FLEX HD (ACELLULAR HYDRATED DERMIS) Left 06/20/2017   Procedure: LEFT BREAST RECONSTRUCTION WITH PLACEMENT OF TISSUE EXPANDER AND FLEX HD (ACELLULAR HYDRATED DERMIS);  Surgeon: Wallace Going, DO;  Location: [facility];  Service: Plastics;  Laterality:  Left;  . COLONOSCOPY    . MASTECTOMY W/ SENTINEL NODE BIOPSY Left 06/20/2017  . MASTECTOMY W/ SENTINEL NODE BIOPSY Left 06/20/2017   Procedure: LEFT MASTECTOMY WITH LEFT SENTINEL LYMPH NODE BIOPSY;  Surgeon: Jovita Kussmaul, MD;  Location: [facility];  Service: General;  Laterality: Left;  . PORTACATH PLACEMENT Right 06/20/2017   Procedure: INSERTION PORT-A-CATH;  Surgeon: Jovita Kussmaul, MD;  Location: [facility];  Service: General;  Laterality: Right;  . WISDOM TOOTH EXTRACTION      Family History  Problem Relation Age of Onset  . Heart attack Mother   . Hyperlipidemia Mother   . Hypertension Mother   . Heart failure Mother   . Breast cancer Paternal Grandmother 34  . Heart disease Maternal Grandfather        died in 15's  . Throat cancer Paternal Grandfather 65   Social History:  reports that she has never smoked. She has never used smokeless tobacco. She reports that she does not drink alcohol or use drugs.  Allergies: No Known Allergies   (Not in a hospital admission)  No results found for this or any previous visit (from the past 48 hour(s)). No results found.  Review of Systems  Constitutional: Negative.   HENT: Negative.   Eyes: Negative.   Respiratory: Negative.   Cardiovascular: Negative.   Gastrointestinal: Negative.   Genitourinary: Negative.   Musculoskeletal: Negative.   Skin: Negative.   Neurological: Negative.   Endo/Heme/Allergies: Negative.  Psychiatric/Behavioral: Negative.     There were no vitals taken for this visit. Physical Exam  Constitutional: She appears well-developed and well-nourished.  HENT:  Head: Normocephalic and atraumatic.  Eyes: Pupils are equal, round, and reactive to light. Conjunctivae are normal.  Respiratory: Effort normal.  GI: Soft.  Skin: Skin is warm. No erythema.  Psychiatric: She has a normal mood and affect. Her behavior is normal. Judgment and thought content normal.     Assessment/Plan Left expander has a total of  550/375 cc Plan removal of left breast tissue expander and placement of left breast silicone implant and right breast reduction for symmetry.  The risks that can be encountered with and after placement of a breast implant placement were discussed.  [place], DO 01/04/2018, 7:12 AM

## 2018-01-11 ENCOUNTER — Ambulatory Visit (HOSPITAL_BASED_OUTPATIENT_CLINIC_OR_DEPARTMENT_OTHER)
Admission: RE | Admit: 2018-01-11 | Discharge: 2018-01-11 | Disposition: A | Discharge status: Home / Self Care | Payer: BLUE CROSS/BLUE SHIELD | Source: Ambulatory Visit | Attending: Plastic Surgery | Admitting: Plastic Surgery

## 2018-01-11 ENCOUNTER — Encounter (HOSPITAL_BASED_OUTPATIENT_CLINIC_OR_DEPARTMENT_OTHER)
Admission: RE | Disposition: A | Discharge status: Home / Self Care | Payer: Self-pay | Source: Ambulatory Visit | Attending: Plastic Surgery

## 2018-01-11 ENCOUNTER — Other Ambulatory Visit: Payer: Self-pay

## 2018-01-11 ENCOUNTER — Ambulatory Visit (HOSPITAL_BASED_OUTPATIENT_CLINIC_OR_DEPARTMENT_OTHER): Payer: BLUE CROSS/BLUE SHIELD | Admitting: Anesthesiology

## 2018-01-11 ENCOUNTER — Encounter (HOSPITAL_BASED_OUTPATIENT_CLINIC_OR_DEPARTMENT_OTHER): Payer: Self-pay | Admitting: Anesthesiology

## 2018-01-11 DIAGNOSIS — Z9012 Acquired absence of left breast and nipple: Secondary | ICD-10-CM

## 2018-01-11 DIAGNOSIS — Z853 Personal history of malignant neoplasm of breast: Secondary | ICD-10-CM | POA: Diagnosis not present

## 2018-01-11 DIAGNOSIS — N6489 Other specified disorders of breast: Secondary | ICD-10-CM | POA: Insufficient documentation

## 2018-01-11 DIAGNOSIS — Z79899 Other long term (current) drug therapy: Secondary | ICD-10-CM | POA: Insufficient documentation

## 2018-01-11 DIAGNOSIS — E785 Hyperlipidemia, unspecified: Secondary | ICD-10-CM | POA: Insufficient documentation

## 2018-01-11 DIAGNOSIS — Z803 Family history of malignant neoplasm of breast: Secondary | ICD-10-CM | POA: Insufficient documentation

## 2018-01-11 DIAGNOSIS — N651 Disproportion of reconstructed breast: Secondary | ICD-10-CM

## 2018-01-11 HISTORY — PX: REMOVAL OF TISSUE EXPANDER AND PLACEMENT OF IMPLANT: SHX6457

## 2018-01-11 HISTORY — PX: BREAST REDUCTION SURGERY: SHX8

## 2018-01-11 LAB — POCT HEMOGLOBIN-HEMACUE: HEMOGLOBIN: 10.3 g/dL — AB (ref 12.0–15.0)

## 2018-01-11 SURGERY — REMOVAL, TISSUE EXPANDER, BREAST, WITH IMPLANT INSERTION
Anesthesia: General | Site: Breast | Laterality: Right

## 2018-01-11 MED ORDER — PROPOFOL 10 MG/ML IV BOLUS
INTRAVENOUS | Status: DC | PRN
  Administered 2018-01-11: 20 mg via INTRAVENOUS
  Administered 2018-01-11: 180 mg via INTRAVENOUS

## 2018-01-11 MED ORDER — SUGAMMADEX SODIUM 200 MG/2ML IV SOLN
INTRAVENOUS | Status: DC | PRN
  Administered 2018-01-11: 200 mg via INTRAVENOUS

## 2018-01-11 MED ORDER — MIDAZOLAM HCL 2 MG/2ML IJ SOLN
INTRAMUSCULAR | Status: AC
  Filled 2018-01-11: qty 2

## 2018-01-11 MED ORDER — EPINEPHRINE 30 MG/30ML IJ SOLN
INTRAMUSCULAR | Status: AC
  Filled 2018-01-11: qty 1

## 2018-01-11 MED ORDER — SODIUM CHLORIDE 0.9% FLUSH: 3.0000 mL | INTRAVENOUS | Status: DC | PRN

## 2018-01-11 MED ORDER — BUPIVACAINE-EPINEPHRINE (PF) 0.25% -1:200000 IJ SOLN
INTRAMUSCULAR | Status: AC
  Filled 2018-01-11: qty 30

## 2018-01-11 MED ORDER — DEXAMETHASONE SODIUM PHOSPHATE 4 MG/ML IJ SOLN
INTRAMUSCULAR | Status: DC | PRN
  Administered 2018-01-11: 10 mg via INTRAVENOUS

## 2018-01-11 MED ORDER — ACETAMINOPHEN 325 MG PO TABS: 650.0000 mg | ORAL_TABLET | ORAL | Status: DC | PRN

## 2018-01-11 MED ORDER — FENTANYL CITRATE (PF) 100 MCG/2ML IJ SOLN
INTRAMUSCULAR | Status: AC
  Filled 2018-01-11: qty 2

## 2018-01-11 MED ORDER — FENTANYL CITRATE (PF) 100 MCG/2ML IJ SOLN: 50.0000 ug | INTRAMUSCULAR | Status: DC | PRN

## 2018-01-11 MED ORDER — PROPOFOL 10 MG/ML IV BOLUS
INTRAVENOUS | Status: AC
  Filled 2018-01-11: qty 20

## 2018-01-11 MED ORDER — ONDANSETRON HCL 4 MG/2ML IJ SOLN
INTRAMUSCULAR | Status: AC
  Filled 2018-01-11: qty 2

## 2018-01-11 MED ORDER — PHENYLEPHRINE 40 MCG/ML (10ML) SYRINGE FOR IV PUSH (FOR BLOOD PRESSURE SUPPORT)
PREFILLED_SYRINGE | INTRAVENOUS | Status: DC | PRN
  Administered 2018-01-11 (×5): 80 ug via INTRAVENOUS

## 2018-01-11 MED ORDER — LACTATED RINGERS IV SOLN: INTRAVENOUS | Status: DC

## 2018-01-11 MED ORDER — LIDOCAINE HCL (CARDIAC) PF 100 MG/5ML IV SOSY
PREFILLED_SYRINGE | INTRAVENOUS | Status: DC | PRN
  Administered 2018-01-11: 50 mg via INTRAVENOUS

## 2018-01-11 MED ORDER — ONDANSETRON HCL 4 MG/2ML IJ SOLN
INTRAMUSCULAR | Status: DC | PRN
  Administered 2018-01-11: 4 mg via INTRAVENOUS

## 2018-01-11 MED ORDER — EPHEDRINE SULFATE 50 MG/ML IJ SOLN
INTRAMUSCULAR | Status: DC | PRN
  Administered 2018-01-11: 5 mg via INTRAVENOUS
  Administered 2018-01-11: 10 mg via INTRAVENOUS

## 2018-01-11 MED ORDER — LIDOCAINE HCL (CARDIAC) PF 100 MG/5ML IV SOSY
PREFILLED_SYRINGE | INTRAVENOUS | Status: AC
  Filled 2018-01-11: qty 5

## 2018-01-11 MED ORDER — ACETAMINOPHEN 650 MG RE SUPP: 650.0000 mg | RECTAL | Status: DC | PRN

## 2018-01-11 MED ORDER — SODIUM CHLORIDE 0.9 % IV SOLN
INTRAVENOUS | Status: DC | PRN
  Administered 2018-01-11: 500 mL

## 2018-01-11 MED ORDER — HYDROMORPHONE HCL 1 MG/ML IJ SOLN: 0.2500 mg | INTRAMUSCULAR | Status: DC | PRN

## 2018-01-11 MED ORDER — CEFAZOLIN SODIUM-DEXTROSE 2-4 GM/100ML-% IV SOLN
2.0000 g | INTRAVENOUS | Status: AC
  Administered 2018-01-11: 2 g via INTRAVENOUS

## 2018-01-11 MED ORDER — DEXAMETHASONE SODIUM PHOSPHATE 10 MG/ML IJ SOLN
INTRAMUSCULAR | Status: AC
  Filled 2018-01-11: qty 1

## 2018-01-11 MED ORDER — LACTATED RINGERS IV SOLN
INTRAVENOUS | Status: DC | PRN
  Administered 2018-01-11 (×2): via INTRAVENOUS

## 2018-01-11 MED ORDER — BUPIVACAINE-EPINEPHRINE 0.25% -1:200000 IJ SOLN
INTRAMUSCULAR | Status: DC | PRN
  Administered 2018-01-11: 10 mL

## 2018-01-11 MED ORDER — LIDOCAINE HCL (PF) 1 % IJ SOLN
INTRAMUSCULAR | Status: AC
  Filled 2018-01-11: qty 30

## 2018-01-11 MED ORDER — FENTANYL CITRATE (PF) 100 MCG/2ML IJ SOLN
INTRAMUSCULAR | Status: DC | PRN
  Administered 2018-01-11 (×2): 100 ug via INTRAVENOUS

## 2018-01-11 MED ORDER — SCOPOLAMINE 1 MG/3DAYS TD PT72: 1.0000 | MEDICATED_PATCH | Freq: Once | TRANSDERMAL | Status: DC | PRN

## 2018-01-11 MED ORDER — SODIUM CHLORIDE 0.9% FLUSH: 3.0000 mL | Freq: Two times a day (BID) | INTRAVENOUS | Status: DC

## 2018-01-11 MED ORDER — OXYCODONE HCL 5 MG PO TABS: 5.0000 mg | ORAL_TABLET | ORAL | Status: DC | PRN

## 2018-01-11 MED ORDER — SODIUM CHLORIDE 0.9 % IV SOLN: 250.0000 mL | INTRAVENOUS | Status: DC | PRN

## 2018-01-11 MED ORDER — MIDAZOLAM HCL 2 MG/2ML IJ SOLN: 1.0000 mg | INTRAMUSCULAR | Status: DC | PRN

## 2018-01-11 MED ORDER — SUCCINYLCHOLINE CHLORIDE 20 MG/ML IJ SOLN
INTRAMUSCULAR | Status: DC | PRN
  Administered 2018-01-11: 100 mg via INTRAVENOUS

## 2018-01-11 MED ORDER — MIDAZOLAM HCL 5 MG/5ML IJ SOLN
INTRAMUSCULAR | Status: DC | PRN
  Administered 2018-01-11: 2 mg via INTRAVENOUS

## 2018-01-11 MED ORDER — CEFAZOLIN SODIUM-DEXTROSE 2-4 GM/100ML-% IV SOLN
INTRAVENOUS | Status: AC
  Filled 2018-01-11: qty 100

## 2018-01-11 MED ORDER — ROCURONIUM BROMIDE 10 MG/ML (PF) SYRINGE
PREFILLED_SYRINGE | INTRAVENOUS | Status: AC
  Filled 2018-01-11: qty 10

## 2018-01-11 SURGICAL SUPPLY — 79 items
ADH SKN CLS APL DERMABOND .7 (GAUZE/BANDAGES/DRESSINGS)
BAG DECANTER FOR FLEXI CONT (MISCELLANEOUS) ×4 IMPLANT
BINDER BREAST LRG (GAUZE/BANDAGES/DRESSINGS) IMPLANT
BINDER BREAST MEDIUM (GAUZE/BANDAGES/DRESSINGS) IMPLANT
BINDER BREAST XLRG (GAUZE/BANDAGES/DRESSINGS) ×2 IMPLANT
BINDER BREAST XXLRG (GAUZE/BANDAGES/DRESSINGS) IMPLANT
BIOPATCH RED 1 DISK 7.0 (GAUZE/BANDAGES/DRESSINGS) IMPLANT
BIOPATCH RED 1IN DISK 7.0MM (GAUZE/BANDAGES/DRESSINGS)
BLADE HEX COATED 2.75 (ELECTRODE) ×2 IMPLANT
BLADE KNIFE PERSONA 10 (BLADE) ×8 IMPLANT
BLADE SURG 15 STRL LF DISP TIS (BLADE) ×2 IMPLANT
BLADE SURG 15 STRL SS (BLADE) ×4
BNDG GAUZE ELAST 4 BULKY (GAUZE/BANDAGES/DRESSINGS) ×8 IMPLANT
CANISTER SUCT 1200ML W/VALVE (MISCELLANEOUS) ×6 IMPLANT
CHLORAPREP W/TINT 26ML (MISCELLANEOUS) ×6 IMPLANT
COVER BACK TABLE 60X90IN (DRAPES) ×4 IMPLANT
COVER MAYO STAND STRL (DRAPES) ×4 IMPLANT
DECANTER SPIKE VIAL GLASS SM (MISCELLANEOUS) ×2 IMPLANT
DERMABOND ADVANCED (GAUZE/BANDAGES/DRESSINGS)
DERMABOND ADVANCED .7 DNX12 (GAUZE/BANDAGES/DRESSINGS) IMPLANT
DRAIN CHANNEL 19F RND (DRAIN) IMPLANT
DRAPE LAPAROSCOPIC ABDOMINAL (DRAPES) ×4 IMPLANT
DRSG PAD ABDOMINAL 8X10 ST (GAUZE/BANDAGES/DRESSINGS) ×8 IMPLANT
ELECT BLADE 4.0 EZ CLEAN MEGAD (MISCELLANEOUS) ×4
ELECT COATED BLADE 2.86 ST (ELECTRODE) ×4 IMPLANT
ELECT REM PT RETURN 9FT ADLT (ELECTROSURGICAL) ×4
ELECTRODE BLDE 4.0 EZ CLN MEGD (MISCELLANEOUS) ×2 IMPLANT
ELECTRODE REM PT RTRN 9FT ADLT (ELECTROSURGICAL) ×2 IMPLANT
EVACUATOR SILICONE 100CC (DRAIN) IMPLANT
GAUZE SPONGE 4X4 12PLY STRL LF (GAUZE/BANDAGES/DRESSINGS) ×4 IMPLANT
GLOVE BIO SURGEON STRL SZ 6.5 (GLOVE) ×9 IMPLANT
GLOVE BIO SURGEON STRL SZ7 (GLOVE) ×2 IMPLANT
GLOVE BIO SURGEONS STRL SZ 6.5 (GLOVE) ×3
GOWN STRL REUS W/ TWL LRG LVL3 (GOWN DISPOSABLE) ×6 IMPLANT
GOWN STRL REUS W/ TWL XL LVL3 (GOWN DISPOSABLE) IMPLANT
GOWN STRL REUS W/TWL LRG LVL3 (GOWN DISPOSABLE) ×4
GOWN STRL REUS W/TWL XL LVL3 (GOWN DISPOSABLE) ×4
IMPL GEL HP 590CC (Breast) IMPLANT
IMPLANT GEL HP 590CC (Breast) ×4 IMPLANT
IV NS 1000ML (IV SOLUTION)
IV NS 1000ML BAXH (IV SOLUTION) IMPLANT
NDL HYPO 25X1 1.5 SAFETY (NEEDLE) ×2 IMPLANT
NDL SAFETY ECLIPSE 18X1.5 (NEEDLE) ×2 IMPLANT
NEEDLE HYPO 18GX1.5 SHARP (NEEDLE) ×4
NEEDLE HYPO 25X1 1.5 SAFETY (NEEDLE) ×4 IMPLANT
NS IRRIG 1000ML POUR BTL (IV SOLUTION) IMPLANT
PACK BASIN DAY SURGERY FS (CUSTOM PROCEDURE TRAY) ×4 IMPLANT
PAD ALCOHOL SWAB (MISCELLANEOUS) IMPLANT
PENCIL BUTTON HOLSTER BLD 10FT (ELECTRODE) ×4 IMPLANT
PIN SAFETY STERILE (MISCELLANEOUS) IMPLANT
SIZER BREAST REUSE 590CC (SIZER) ×4
SIZER BRST REUSE 590CC (SIZER) IMPLANT
SLEEVE SCD COMPRESS KNEE MED (MISCELLANEOUS) ×4 IMPLANT
SPONGE LAP 18X18 RF (DISPOSABLE) ×10 IMPLANT
STRIP SUTURE WOUND CLOSURE 1/2 (SUTURE) ×8 IMPLANT
SUT MNCRL AB 3-0 PS2 18 (SUTURE) IMPLANT
SUT MNCRL AB 4-0 PS2 18 (SUTURE) ×14 IMPLANT
SUT MON AB 3-0 SH 27 (SUTURE) ×20
SUT MON AB 3-0 SH27 (SUTURE) ×2 IMPLANT
SUT MON AB 5-0 PS2 18 (SUTURE) ×8 IMPLANT
SUT PDS 3-0 CT2 (SUTURE)
SUT PDS AB 2-0 CT2 27 (SUTURE) IMPLANT
SUT PDS II 3-0 CT2 27 ABS (SUTURE) IMPLANT
SUT SILK 3 0 PS 1 (SUTURE) IMPLANT
SUT VIC AB 3-0 SH 27 (SUTURE)
SUT VIC AB 3-0 SH 27X BRD (SUTURE) ×2 IMPLANT
SUT VICRYL 4-0 PS2 18IN ABS (SUTURE) ×2 IMPLANT
SYR 3ML 23GX1 SAFETY (SYRINGE) ×2 IMPLANT
SYR 50ML LL SCALE MARK (SYRINGE) IMPLANT
SYR BULB IRRIGATION 50ML (SYRINGE) ×4 IMPLANT
SYR CONTROL 10ML LL (SYRINGE) ×4 IMPLANT
TAPE MEASURE VINYL STERILE (MISCELLANEOUS) ×2 IMPLANT
TOWEL GREEN STERILE FF (TOWEL DISPOSABLE) ×8 IMPLANT
TUBE CONNECTING 20'X1/4 (TUBING) ×1
TUBE CONNECTING 20X1/4 (TUBING) ×3 IMPLANT
TUBING INFILTRATION IT-10001 (TUBING) IMPLANT
TUBING SET GRADUATE ASPIR 12FT (MISCELLANEOUS) IMPLANT
UNDERPAD 30X30 (UNDERPADS AND DIAPERS) ×8 IMPLANT
YANKAUER SUCT BULB TIP NO VENT (SUCTIONS) ×4 IMPLANT

## 2018-01-11 NOTE — Op Note (Signed)
Op report Unilateral Breast Exchange   DATE OF OPERATION:  01/11/2018  LOCATION: [facility]  SURGICAL DIVISION: Plastic Surgery  PREOPERATIVE DIAGNOSES:  1. History of left breast cancer.  2. Acquired absence of left breast.  3. Right breast asymmetry.  POSTOPERATIVE DIAGNOSES:  1. History of left breast cancer.  2. Acquired absence of left breast.  3. Right breast asymmetry.  PROCEDURE:  1. Exchange of left breast tissue expander for implant. 2. Capsulotomies for implant respositioning. 3. Right breast reduction for symmetry. (250 gm)  SURGEON: Jewelia Bocchino Sanger Madisan Bice, DO  ANESTHESIA:  General.   COMPLICATIONS: None.   IMPLANTS: LEFT: [place] Smooth Round Ultra High Profile Gel 590cc. Ref #[id number].  Serial Number [id number]   INDICATIONS FOR PROCEDURE:  The patient, Charlot Gouin, is a 42 y.o. female born on Aug 22, 1975, is here for treatment for further treatment after a mastectomy and placement of a tissue expander. She now presents for exchange of her expander for an implant.  She requires capsulotomies to better position the implant. MRN: [id number]  CONSENT:  Informed consent was obtained directly from the patient. Risks, benefits and alternatives were fully discussed. Specific risks including but not limited to bleeding, infection, hematoma, seroma, scarring, pain, implant infection, implant extrusion, capsular contracture, asymmetry, wound healing problems, and need for further surgery were all discussed. The patient did have an ample opportunity to have her questions answered to her satisfaction.   DESCRIPTION OF PROCEDURE:  The patient was taken to the operating room. SCDs were placed and IV antibiotics were given. The patient's chest was prepped and draped in a sterile fashion. A time out was performed and the implants to be used were identified.  Local was used to infiltrate the area.   Left: The old mastectomy scar was opened and superior  mastectomy and inferior mastectomy flaps were re-raised over the pectoralis major muscle. The pectoralis was split to expose the tissue expander which was removed. Inspection of the pocket showed a normal healthy capsule and good integration of the biologic matrix.   Circumferential capsulotomies were performed to allow for breast pocket expansion.  Measurements were made to confirm adequate pocket size for the implant dimensions.  Hemostasis was ensured with electrocautery.  The pocket was irrigated with antibiotic solution.  New gloves were placed.  The implant was placed in the pocket and oriented appropriately. The pectoralis major muscle and capsule on the anterior surface were re-closed with a 3-0 running Monocryl suture. The remaining skin was closed with 4-0 Monocryl deep dermal and 5-0 Monocryl subcuticular stitches.  Dermabond was applied.   Right side: Preoperative markings were confirmed.  Incision lines were injected with 1% Xylocaine with epinephrine.  After waiting for vasoconstriction, the marked lines were incised.  A Wise-pattern superomedial breast reduction was performed by de-epithelializing the pedicle, using bovie to create the superomedial pedicle, and removing breast tissue from the superior, lateral, and inferior portions of the breast.  Care was taken to not undermine the breast pedicle. Hemostasis was achieved.  The nipple was gently rotated into position and the soft tissue closed with 4-0 Monocryl.   The pocket was irrigated and hemostasis confirmed.  The deep tissues were approximated with 3-0 monocryl sutures and the skin was closed with deep dermal and subcuticular 4-0 Monocryl sutures.  The nipple and skin flaps had good capillary refill at the end of the procedure.  Dermabond was applied.  A breast binder and ABDs were placed.  The nipple and skin flaps had  good capillary refill at the end of the procedure.  The patient tolerated the procedure well. The patient was allowed to  wake from anesthesia and taken to the recovery room in satisfactory condition

## 2018-01-11 NOTE — Transfer of Care (Signed)
Immediate Anesthesia Transfer of Care Note  Patient: Alexis Underwood  Procedure(s) Performed: REMOVAL OF LEFT TISSUE EXPANDER AND PLACEMENT OF LEFT BREAST SILICONE IMPLANT (Left Breast) RIGHT BREAST REDUCTION/MASTOPEXY FOR SYMMETRY (Right Breast)  Patient Location: PACU  Anesthesia Type:General  Level of Consciousness: awake, alert  and oriented  Airway & Oxygen Therapy: Patient Spontanous Breathing and Patient connected to face mask oxygen  Post-op Assessment: Report given to RN and Post -op Vital signs reviewed and stable  Post vital signs: Reviewed and stable  Last Vitals:  Vitals Value Taken Time  BP 130/74 01/11/2018 12:04 PM  Temp    Pulse 106 01/11/2018 12:07 PM  Resp 17 01/11/2018 12:07 PM  SpO2 100 % 01/11/2018 12:07 PM  Vitals shown include unvalidated device data.  Last Pain:  Vitals:   01/11/18 0853  TempSrc: Oral  PainSc: 0-No pain         Complications: No apparent anesthesia complications

## 2018-01-11 NOTE — Anesthesia Postprocedure Evaluation (Signed)
Anesthesia Post Note  Patient: Alexis Underwood  Procedure(s) Performed: REMOVAL OF LEFT TISSUE EXPANDER AND PLACEMENT OF LEFT BREAST SILICONE IMPLANT (Left Breast) RIGHT BREAST REDUCTION/MASTOPEXY FOR SYMMETRY (Right Breast)     Patient location during evaluation: PACU Anesthesia Type: General Level of consciousness: awake and alert Pain management: pain level controlled Vital Signs Assessment: post-procedure vital signs reviewed and stable Respiratory status: spontaneous breathing, nonlabored ventilation and respiratory function stable Cardiovascular status: blood pressure returned to baseline and stable Postop Assessment: no apparent nausea or vomiting Anesthetic complications: no    Last Vitals:  Vitals:   01/11/18 1315 01/11/18 1330  BP: 126/85 123/84  Pulse: 98 93  Resp: (!) 23 14  Temp:    SpO2: 95% 96%    Last Pain:  Vitals:   01/11/18 1330  TempSrc:   PainSc: 3                  Ceasia Elwell,W. EDMOND

## 2018-01-11 NOTE — Anesthesia Preprocedure Evaluation (Addendum)
Anesthesia Evaluation  Patient identified by MRN, date of birth, ID band Patient awake    Reviewed: Allergy & Precautions, H&P , NPO status , Patient's Chart, lab work & pertinent test results  Airway Mallampati: II  TM Distance: >3 FB Neck ROM: Full    Dental no notable dental hx. (+) Teeth Intact, Dental Advisory Given   Pulmonary neg pulmonary ROS,    Pulmonary exam normal breath sounds clear to auscultation       Cardiovascular negative cardio ROS   Rhythm:Regular Rate:Normal     Neuro/Psych negative neurological ROS  negative psych ROS   GI/Hepatic negative GI ROS, Neg liver ROS,   Endo/Other  negative endocrine ROS  Renal/GU negative Renal ROS  negative genitourinary   Musculoskeletal   Abdominal   Peds  Hematology negative hematology ROS (+)   Anesthesia Other Findings   Reproductive/Obstetrics negative OB ROS                            Anesthesia Physical Anesthesia Plan  ASA: II  Anesthesia Plan: General   Post-op Pain Management:    Induction: Intravenous  PONV Risk Score and Plan: 4 or greater and Ondansetron, Dexamethasone and Midazolam  Airway Management Planned: Oral ETT  Additional Equipment:   Intra-op Plan:   Post-operative Plan: Extubation in OR  Informed Consent: I have reviewed the patients History and Physical, chart, labs and discussed the procedure including the risks, benefits and alternatives for the proposed anesthesia with the patient or authorized representative who has indicated his/her understanding and acceptance.   Dental advisory given  Plan Discussed with: CRNA  Anesthesia Plan Comments: (Pt was hoarse for a month after her primary surgery and asked if there was anything that we could do differently this time to prevent that. We will use a smaller ETT.)       Anesthesia Quick Evaluation

## 2018-01-11 NOTE — Interval H&P Note (Signed)
History and Physical Interval Note:  01/11/2018 8:50 AM  Alexis Underwood  has presented today for surgery, with the diagnosis of Acquired absence of left breast, Malignant neoplasm of upper-outer quadrant of left breast in female, estrogen receptor positive  The various methods of treatment have been discussed with the patient and family. After consideration of risks, benefits and other options for treatment, the patient has consented to  Procedure(s): REMOVAL OF LEFT TISSUE EXPANDER AND PLACEMENT OF LEFT BREAST SILICONE IMPLANT (Left) RIGHT BREAST REDUCTION/MASTOPEXY FOR SYMMETRY (Right) as a surgical intervention .  The patient's history has been reviewed, patient examined, no change in status, stable for surgery.  I have reviewed the patient's chart and labs.  Questions were answered to the patient's satisfaction.     Loel Lofty Alexis Underwood

## 2018-01-11 NOTE — Anesthesia Procedure Notes (Signed)
Procedure Name: Intubation Date/Time: 01/11/2018 9:41 AM Performed by: Lieutenant Diego, CRNA Pre-anesthesia Checklist: Patient identified, Emergency Drugs available, Suction available and Patient being monitored Patient Re-evaluated:Patient Re-evaluated prior to induction Oxygen Delivery Method: Circle system utilized Preoxygenation: Pre-oxygenation with 100% oxygen Induction Type: IV induction Ventilation: Mask ventilation without difficulty Laryngoscope Size: Miller and 2 Grade View: Grade I Tube type: Oral Tube size: 6.5 mm Number of attempts: 1 Airway Equipment and Method: Stylet and Oral airway Placement Confirmation: ETT inserted through vocal cords under direct vision,  positive ETCO2 and breath sounds checked- equal and bilateral Secured at: 23 cm Tube secured with: Tape Dental Injury: Teeth and Oropharynx as per pre-operative assessment

## 2018-01-11 NOTE — Discharge Instructions (Signed)
°  Post Anesthesia Home Care Instructions ° °Activity: °Get plenty of rest for the remainder of the day. A responsible individual must stay with you for 24 hours following the procedure.  °For the next 24 hours, DO NOT: °-Drive a car °-Operate machinery °-Drink alcoholic beverages °-Take any medication unless instructed by your physician °-Make any legal decisions or sign important papers. ° °Meals: °Start with liquid foods such as gelatin or soup. Progress to regular foods as tolerated. Avoid greasy, spicy, heavy foods. If nausea and/or vomiting occur, drink only clear liquids until the nausea and/or vomiting subsides. Call your physician if vomiting continues. ° °Special Instructions/Symptoms: °Your throat may feel dry or sore from the anesthesia or the breathing tube placed in your throat during surgery. If this causes discomfort, gargle with warm salt water. The discomfort should disappear within 24 hours. ° °If you had a scopolamine patch placed behind your ear for the management of post- operative nausea and/or vomiting: ° °1. The medication in the patch is effective for 72 hours, after which it should be removed.  Wrap patch in a tissue and discard in the trash. Wash hands thoroughly with soap and water. °2. You may remove the patch earlier than 72 hours if you experience unpleasant side effects which may include dry mouth, dizziness or visual disturbances. °3. Avoid touching the patch. Wash your hands with soap and water after contact with the patch. °  ° ° ° °No heavy lifting. °May shower tomorrow. °Continue binder or sports bra. ° °

## 2018-01-14 ENCOUNTER — Encounter (HOSPITAL_BASED_OUTPATIENT_CLINIC_OR_DEPARTMENT_OTHER): Payer: Self-pay | Admitting: Plastic Surgery

## 2018-01-22 ENCOUNTER — Telehealth: Payer: Self-pay | Admitting: Hematology and Oncology

## 2018-01-22 ENCOUNTER — Inpatient Hospital Stay: Payer: BLUE CROSS/BLUE SHIELD | Attending: Hematology and Oncology

## 2018-01-22 ENCOUNTER — Other Ambulatory Visit: Payer: Self-pay | Admitting: Hematology and Oncology

## 2018-01-22 VITALS — BP 135/80 | HR 72 | Temp 98.0°F | Resp 16 | Wt 140.0 lb

## 2018-01-22 DIAGNOSIS — Z9012 Acquired absence of left breast and nipple: Secondary | ICD-10-CM | POA: Diagnosis not present

## 2018-01-22 DIAGNOSIS — Z5112 Encounter for antineoplastic immunotherapy: Secondary | ICD-10-CM | POA: Diagnosis present

## 2018-01-22 DIAGNOSIS — Z9221 Personal history of antineoplastic chemotherapy: Secondary | ICD-10-CM | POA: Insufficient documentation

## 2018-01-22 DIAGNOSIS — Z170 Estrogen receptor positive status [ER+]: Secondary | ICD-10-CM | POA: Diagnosis not present

## 2018-01-22 DIAGNOSIS — C50412 Malignant neoplasm of upper-outer quadrant of left female breast: Secondary | ICD-10-CM | POA: Insufficient documentation

## 2018-01-22 MED ORDER — SODIUM CHLORIDE 0.9 % IV SOLN
420.0000 mg | Freq: Once | INTRAVENOUS | Status: AC
  Administered 2018-01-22: 420 mg via INTRAVENOUS
  Filled 2018-01-22: qty 14

## 2018-01-22 MED ORDER — ACETAMINOPHEN 325 MG PO TABS
650.0000 mg | ORAL_TABLET | Freq: Once | ORAL | Status: AC
  Administered 2018-01-22: 650 mg via ORAL

## 2018-01-22 MED ORDER — ACETAMINOPHEN 325 MG PO TABS
ORAL_TABLET | ORAL | Status: AC
  Filled 2018-01-22: qty 2

## 2018-01-22 MED ORDER — DIPHENHYDRAMINE HCL 25 MG PO CAPS
50.0000 mg | ORAL_CAPSULE | Freq: Once | ORAL | Status: AC
  Administered 2018-01-22: 25 mg via ORAL

## 2018-01-22 MED ORDER — HEPARIN SOD (PORK) LOCK FLUSH 100 UNIT/ML IV SOLN
500.0000 [IU] | Freq: Once | INTRAVENOUS | Status: AC | PRN
  Administered 2018-01-22: 500 [IU]
  Filled 2018-01-22: qty 5

## 2018-01-22 MED ORDER — SODIUM CHLORIDE 0.9 % IV SOLN
Freq: Once | INTRAVENOUS | Status: AC
  Administered 2018-01-22: 09:00:00 via INTRAVENOUS

## 2018-01-22 MED ORDER — DIPHENHYDRAMINE HCL 25 MG PO CAPS
ORAL_CAPSULE | ORAL | Status: AC
  Filled 2018-01-22: qty 2

## 2018-01-22 MED ORDER — TRASTUZUMAB CHEMO 150 MG IV SOLR
6.0000 mg/kg | Freq: Once | INTRAVENOUS | Status: AC
  Administered 2018-01-22: 378 mg via INTRAVENOUS
  Filled 2018-01-22: qty 18

## 2018-01-22 MED ORDER — SODIUM CHLORIDE 0.9% FLUSH
10.0000 mL | INTRAVENOUS | Status: DC | PRN
  Administered 2018-01-22: 10 mL
  Filled 2018-01-22: qty 10

## 2018-01-22 NOTE — Telephone Encounter (Signed)
R/s patients appts due to vacation. VG aware. Patient scheduled in new patient slot due to avail in treatment area.

## 2018-01-22 NOTE — Patient Instructions (Signed)
[facility] Discharge Instructions for Patients Receiving Chemotherapy  Today you received the following chemotherapy agents :  Herceptin, Perjeta.  To help prevent nausea and vomiting after your treatment, we encourage you to take your nausea medication as prescribed.   If you develop nausea and vomiting that is not controlled by your nausea medication, call the clinic.   BELOW ARE SYMPTOMS THAT SHOULD BE REPORTED IMMEDIATELY:  *FEVER GREATER THAN 100.5 F  *CHILLS WITH OR WITHOUT FEVER  NAUSEA AND VOMITING THAT IS NOT CONTROLLED WITH YOUR NAUSEA MEDICATION  *UNUSUAL SHORTNESS OF BREATH  *UNUSUAL BRUISING OR BLEEDING  TENDERNESS IN MOUTH AND THROAT WITH OR WITHOUT PRESENCE OF ULCERS  *URINARY PROBLEMS  *BOWEL PROBLEMS  UNUSUAL RASH Items with * indicate a potential emergency and should be followed up as soon as possible.  Feel free to call the clinic should you have any questions or concerns. The clinic phone number is [phone].  Please show the CHEMO ALERT CARD at check-in to the Emergency Department and triage nurse.   

## 2018-01-28 ENCOUNTER — Other Ambulatory Visit: Payer: Self-pay | Admitting: Hematology and Oncology

## 2018-01-29 ENCOUNTER — Other Ambulatory Visit (HOSPITAL_COMMUNITY): Payer: Self-pay | Admitting: *Deleted

## 2018-01-29 DIAGNOSIS — C50412 Malignant neoplasm of upper-outer quadrant of left female breast: Secondary | ICD-10-CM

## 2018-01-30 ENCOUNTER — Ambulatory Visit (HOSPITAL_COMMUNITY)
Admission: RE | Admit: 2018-01-30 | Discharge: 2018-01-30 | Disposition: A | Discharge status: Home / Self Care | Payer: BLUE CROSS/BLUE SHIELD | Source: Ambulatory Visit | Attending: Internal Medicine | Admitting: Internal Medicine

## 2018-01-30 ENCOUNTER — Ambulatory Visit (HOSPITAL_BASED_OUTPATIENT_CLINIC_OR_DEPARTMENT_OTHER)
Admission: RE | Admit: 2018-01-30 | Discharge: 2018-01-30 | Disposition: A | Discharge status: Home / Self Care | Payer: BLUE CROSS/BLUE SHIELD | Source: Ambulatory Visit | Attending: Internal Medicine | Admitting: Internal Medicine

## 2018-01-30 VITALS — BP 149/87 | HR 77 | Wt 144.0 lb

## 2018-01-30 DIAGNOSIS — C50412 Malignant neoplasm of upper-outer quadrant of left female breast: Secondary | ICD-10-CM | POA: Diagnosis not present

## 2018-01-30 DIAGNOSIS — I361 Nonrheumatic tricuspid (valve) insufficiency: Secondary | ICD-10-CM

## 2018-01-30 DIAGNOSIS — I10 Essential (primary) hypertension: Secondary | ICD-10-CM

## 2018-01-30 DIAGNOSIS — E785 Hyperlipidemia, unspecified: Secondary | ICD-10-CM | POA: Diagnosis not present

## 2018-01-30 NOTE — Progress Notes (Signed)
CARDIO-ONCOLOGY CLINIC  NOTE  Referring Physician: Lindi Adie   HPI: [name] is 42 y.o. 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  Denies any significant PMHx other than her breast CA.   Did not tolerate taxotere due to severe rash. Finished carbo, hercetptin and Perjeta in 6/19. Now getting Herceptin and Perjeta. Had reconstruction 7/5. Still sore but back to work and otherwise doing we.. No HF symptoms.   Echo 7/19: EF 60-65% GLS -18.1% (Underestimated due to limited images) % Personally reviewed  Echo 09/13/17: EF 60-65% GLS -21.1%   Echo (06/14/17); EF 55-60% normal RV. Normal diastolic function. GLS -23.3. LS not adequate    SUMMARY OF ONCOLOGIC HISTORY:       Malignant neoplasm of upper-outer quadrant of left breast in female, estrogen receptor positive ([place])   04/16/2017 Initial Diagnosis    Screening mammogram detected calcifications left breast UOQ 9.3 cm, at 2:00: 1.4 cm and at 1:00: 8 mm; biopsy of mass at 1:00: IDC grade 2 with DCIS ER 95%, PR 90%,Ki-67 45%, HER-2 positive ratio 5.18; iopsy of the 2:00 mass and calcifications were DCIS with suspicion for microinvasion, T1 cN0 stage IA clinical stage AJCC 8      05/09/2017 Genetic Testing    The patient had genetic testing due to a personal and family history of breast cancer.  The Common Hereditary Cancer Panel was ordered.  The Hereditary Gene Panel offered by [facility] includes sequencing and/or deletion duplication testing of the following 47 genes: APC, ATM, AXIN2, BARD1, BMPR1A, BRCA1, BRCA2, BRIP1, CDH1, CDKN2A (p14ARF), CDKN2A (p16INK4a), CKD4, CHEK2, CTNNA1, DICER1, EPCAM (Deletion/duplication testing only), GREM1 (promoter region deletion/duplication testing only), KIT, MEN1, MLH1, MSH2, MSH3, MSH6, MUTYH, NBN, NF1, NHTL1, PALB2, PDGFRA, PMS2, POLD1, POLE, PTEN, RAD50, RAD51C, RAD51D, SDHB,  SDHC, SDHD, SMAD4, SMARCA4. STK11, TP53, TSC1, TSC2, and VHL.  The following genes were evaluated for sequence changes only: SDHA and HOXB13 c.251G>A variant only.  Results- No pathogenic variants identified.  A Variant of Uncertain Significance was identified in the gene APC c.-30626C>G (Promoter 1B).  The date of this test report is 05/09/2017.       06/20/2017 Surgery    Left mastectomy: Multifocal IDC with DCIS tumor size is 1.1, 1.6 and 2.4 cm, margins negative, 0/5 lymph nodes negative, ER 95%, PR 90%, HER-2 positive ratio 5.18, Ki-67 45% T2N0 stage Ib pathologic stage      07/24/2017 -  Chemotherapy    TCH P followed by Herceptin and Perjeta maintenance          Past Medical History:  Diagnosis Date  . Breast cancer ([place])   . Family history of breast cancer   . Family history of throat cancer   . History of kidney stones   . Hyperlipidemia   . SVD (spontaneous vaginal delivery)     Current Outpatient Medications  Medication Sig Dispense Refill  . acetaminophen (TYLENOL) 325 MG tablet Take 650 mg by mouth daily as needed for headache.    . lovastatin (MEVACOR) 40 MG tablet Take 40 mg by mouth every evening.    . pantoprazole (PROTONIX) 40 MG tablet TAKE ONE (1) TABLET BY MOUTH EVERY DAY 30 tablet 3  . tamoxifen (NOLVADEX) 20 MG tablet Take 1 tablet (20 mg total) by mouth daily. (Patient not taking: Reported on 01/30/2018) 30 tablet 0   No current facility-administered medications for this encounter.     No  Known Allergies    Social History   Socioeconomic History  . Marital status: Married    Spouse name: Not on file  . Number of children: Not on file  . Years of education: Not on file  . Highest education level: Not on file  Occupational History  . Not on file  Social Needs  . Financial resource strain: Not on file  . Food insecurity:    Worry: Not on file    Inability: Not on file  . Transportation needs:    Medical: Not on file     Non-medical: Not on file  Tobacco Use  . Smoking status: Never Smoker  . Smokeless tobacco: Never Used  Substance and Sexual Activity  . Alcohol use: No  . Drug use: No  . Sexual activity: Not on file  Lifestyle  . Physical activity:    Days per week: Not on file    Minutes per session: Not on file  . Stress: Not on file  Relationships  . Social connections:    Talks on phone: Not on file    Gets together: Not on file    Attends religious service: Not on file    Active member of club or organization: Not on file    Attends meetings of clubs or organizations: Not on file    Relationship status: Not on file  . Intimate partner violence:    Fear of current or ex partner: Not on file    Emotionally abused: Not on file    Physically abused: Not on file    Forced sexual activity: Not on file  Other Topics Concern  . Not on file  Social History Narrative  . Not on file      Family History  Problem Relation Age of Onset  . Heart attack Mother   . Hyperlipidemia Mother   . Hypertension Mother   . Heart failure Mother   . Breast cancer Paternal Grandmother 59  . Heart disease Maternal Grandfather        died in 87's  . Throat cancer Paternal Grandfather 39    Vitals:   01/30/18 1509  BP: (!) 149/87  Pulse: 77  SpO2: 100%  Weight: 144 lb (65.3 kg)    PHYSICAL EXAM: General:  Well appearing. No resp difficulty HEENT: normal Neck: supple. no JVD. Carotids 2+ bilat; no bruits. No lymphadenopathy or thryomegaly appreciated. Cor: PMI nondisplaced. Regular rate & rhythm. No rubs, gallops or murmurs. R port-a-cath looks ok Lungs: clear Abdomen: soft, nontender, nondistended. No hepatosplenomegaly. No bruits or masses. Good bowel sounds. Extremities: no cyanosis, clubbing, rash, edema Neuro: alert & orientedx3, cranial nerves grossly intact. moves all 4 extremities w/o difficulty. Affect pleasant   ASSESSMENT & PLAN: 1. Left breast Cancer, triple positive - ER 95%, PR  90%,Ki-67 45%, HER-2 positive ratio 5.18; - T1 cN0 stage IA clinical stage AJCC 8 - I reviewed echos personally. EF and Doppler parameters stable. No HF on exam. Continue Herceptin.   2. High blood pressure - SBPs at [facility] reviewed mostly in 130s. If she goes over 140 will need to consider anti-HTN  Glori Bickers, MD  3:21 PM

## 2018-01-30 NOTE — Patient Instructions (Signed)
Your physician recommends that you schedule a follow-up appointment in: 3 months with echocardiogram  

## 2018-01-30 NOTE — Addendum Note (Signed)
Encounter addended by: Scarlette Calico, RN on: 01/30/2018 3:33 PM  Actions taken: Order list changed, Diagnosis association updated

## 2018-01-30 NOTE — Progress Notes (Signed)
  Echocardiogram 2D Echocardiogram has been performed.  Extremely difficult study due to patient implant and recent reconstruction (July)  Alexis Underwood 01/30/2018, 2:44 PM

## 2018-02-12 ENCOUNTER — Ambulatory Visit: Payer: BLUE CROSS/BLUE SHIELD

## 2018-02-12 ENCOUNTER — Other Ambulatory Visit: Payer: BLUE CROSS/BLUE SHIELD

## 2018-02-12 ENCOUNTER — Ambulatory Visit: Payer: BLUE CROSS/BLUE SHIELD | Admitting: Hematology and Oncology

## 2018-02-19 ENCOUNTER — Inpatient Hospital Stay: Payer: BLUE CROSS/BLUE SHIELD

## 2018-02-19 ENCOUNTER — Encounter: Payer: Self-pay | Admitting: *Deleted

## 2018-02-19 ENCOUNTER — Inpatient Hospital Stay (HOSPITAL_BASED_OUTPATIENT_CLINIC_OR_DEPARTMENT_OTHER): Payer: BLUE CROSS/BLUE SHIELD | Admitting: Hematology and Oncology

## 2018-02-19 ENCOUNTER — Telehealth: Payer: Self-pay | Admitting: Hematology and Oncology

## 2018-02-19 ENCOUNTER — Inpatient Hospital Stay: Payer: BLUE CROSS/BLUE SHIELD | Attending: Hematology and Oncology

## 2018-02-19 DIAGNOSIS — Z5112 Encounter for antineoplastic immunotherapy: Secondary | ICD-10-CM | POA: Insufficient documentation

## 2018-02-19 DIAGNOSIS — Z170 Estrogen receptor positive status [ER+]: Principal | ICD-10-CM

## 2018-02-19 DIAGNOSIS — D500 Iron deficiency anemia secondary to blood loss (chronic): Secondary | ICD-10-CM | POA: Diagnosis not present

## 2018-02-19 DIAGNOSIS — N920 Excessive and frequent menstruation with regular cycle: Secondary | ICD-10-CM

## 2018-02-19 DIAGNOSIS — D6481 Anemia due to antineoplastic chemotherapy: Secondary | ICD-10-CM

## 2018-02-19 DIAGNOSIS — C50412 Malignant neoplasm of upper-outer quadrant of left female breast: Secondary | ICD-10-CM | POA: Diagnosis present

## 2018-02-19 DIAGNOSIS — N200 Calculus of kidney: Secondary | ICD-10-CM

## 2018-02-19 DIAGNOSIS — Z9012 Acquired absence of left breast and nipple: Secondary | ICD-10-CM

## 2018-02-19 DIAGNOSIS — Z95828 Presence of other vascular implants and grafts: Secondary | ICD-10-CM

## 2018-02-19 LAB — CBC WITH DIFFERENTIAL/PLATELET
Basophils Absolute: 0 10*3/uL (ref 0.0–0.1)
Basophils Relative: 0 %
Eosinophils Absolute: 0.1 10*3/uL (ref 0.0–0.5)
Eosinophils Relative: 2 %
HEMATOCRIT: 35.4 % (ref 34.8–46.6)
Hemoglobin: 12.1 g/dL (ref 11.6–15.9)
LYMPHS ABS: 1.3 10*3/uL (ref 0.9–3.3)
Lymphocytes Relative: 20 %
MCH: 34.9 pg — AB (ref 25.1–34.0)
MCHC: 34.2 g/dL (ref 31.5–36.0)
MCV: 102.1 fL — AB (ref 79.5–101.0)
MONO ABS: 0.5 10*3/uL (ref 0.1–0.9)
Monocytes Relative: 7 %
NEUTROS ABS: 4.6 10*3/uL (ref 1.5–6.5)
Neutrophils Relative %: 71 %
Platelets: 291 10*3/uL (ref 145–400)
RBC: 3.47 MIL/uL — ABNORMAL LOW (ref 3.70–5.45)
RDW: 13.2 % (ref 11.2–14.5)
WBC: 6.6 10*3/uL (ref 3.9–10.3)

## 2018-02-19 LAB — COMPREHENSIVE METABOLIC PANEL
ALBUMIN: 3.9 g/dL (ref 3.5–5.0)
ALK PHOS: 106 U/L (ref 38–126)
ALT: 19 U/L (ref 0–44)
ANION GAP: 11 (ref 5–15)
AST: 18 U/L (ref 15–41)
BUN: 12 mg/dL (ref 6–20)
CALCIUM: 8.7 mg/dL — AB (ref 8.9–10.3)
CO2: 25 mmol/L (ref 22–32)
Chloride: 105 mmol/L (ref 98–111)
Creatinine, Ser: 0.84 mg/dL (ref 0.44–1.00)
GFR calc Af Amer: >60 mL/min (ref >60)
GFR calc non Af Amer: >60 mL/min (ref >60)
GLUCOSE: 127 mg/dL — AB (ref 70–99)
POTASSIUM: 3.4 mmol/L — AB (ref 3.5–5.1)
SODIUM: 141 mmol/L (ref 135–145)
Total Bilirubin: 0.3 mg/dL (ref 0.3–1.2)
Total Protein: 6.7 g/dL (ref 6.5–8.1)

## 2018-02-19 MED ORDER — ACETAMINOPHEN 325 MG PO TABS
ORAL_TABLET | ORAL | Status: AC
  Filled 2018-02-19: qty 2

## 2018-02-19 MED ORDER — PERTUZUMAB CHEMO INJECTION 420 MG/14ML
420.0000 mg | Freq: Once | INTRAVENOUS | Status: AC
  Administered 2018-02-19: 420 mg via INTRAVENOUS
  Filled 2018-02-19: qty 14

## 2018-02-19 MED ORDER — SODIUM CHLORIDE 0.9 % IV SOLN
6.0000 mg/kg | Freq: Once | INTRAVENOUS | Status: AC
  Administered 2018-02-19: 378 mg via INTRAVENOUS
  Filled 2018-02-19: qty 18

## 2018-02-19 MED ORDER — SODIUM CHLORIDE 0.9% FLUSH
10.0000 mL | INTRAVENOUS | Status: DC | PRN
  Administered 2018-02-19: 10 mL
  Filled 2018-02-19: qty 10

## 2018-02-19 MED ORDER — ACETAMINOPHEN 325 MG PO TABS
650.0000 mg | ORAL_TABLET | Freq: Once | ORAL | Status: AC
  Administered 2018-02-19: 650 mg via ORAL

## 2018-02-19 MED ORDER — SODIUM CHLORIDE 0.9% FLUSH
10.0000 mL | INTRAVENOUS | Status: DC | PRN
  Administered 2018-02-19: 10 mL via INTRAVENOUS
  Filled 2018-02-19: qty 10

## 2018-02-19 MED ORDER — DIPHENHYDRAMINE HCL 25 MG PO CAPS
ORAL_CAPSULE | ORAL | Status: AC
  Filled 2018-02-19: qty 1

## 2018-02-19 MED ORDER — DIPHENHYDRAMINE HCL 25 MG PO CAPS
25.0000 mg | ORAL_CAPSULE | Freq: Once | ORAL | Status: AC
  Administered 2018-02-19: 25 mg via ORAL

## 2018-02-19 MED ORDER — TAMOXIFEN CITRATE 20 MG PO TABS: 20.0000 mg | ORAL_TABLET | Freq: Every day | ORAL | 3 refills | Status: DC

## 2018-02-19 MED ORDER — HEPARIN SOD (PORK) LOCK FLUSH 100 UNIT/ML IV SOLN
500.0000 [IU] | Freq: Once | INTRAVENOUS | Status: AC | PRN
  Administered 2018-02-19: 500 [IU]
  Filled 2018-02-19: qty 5

## 2018-02-19 MED ORDER — SODIUM CHLORIDE 0.9 % IV SOLN
Freq: Once | INTRAVENOUS | Status: AC
  Administered 2018-02-19: 14:00:00 via INTRAVENOUS
  Filled 2018-02-19: qty 250

## 2018-02-19 NOTE — Telephone Encounter (Signed)
Per 8/13 los.  Patient did not need AVS or calendar.  My chart active.

## 2018-02-19 NOTE — Progress Notes (Signed)
Patient Care Team: Carol Ada, MD as PCP - General (Family Medicine)  DIAGNOSIS:  Encounter Diagnosis  Name Primary?  . Malignant neoplasm of upper-outer quadrant of left breast in female, estrogen receptor positive ([place])     SUMMARY OF ONCOLOGIC HISTORY:   Malignant neoplasm of upper-outer quadrant of left breast in female, estrogen receptor positive ([place])   04/16/2017 Initial Diagnosis    Screening mammogram detected calcifications left breast UOQ 9.3 cm, at 2:00: 1.4 cm and at 1:00: 8 mm; biopsy of mass at 1:00: IDC grade 2 with DCIS ER 95%, PR 90%,Ki-67 45%, HER-2 positive ratio 5.18; iopsy of the 2:00 mass and calcifications were DCIS with suspicion for microinvasion, T1 cN0 stage IA clinical stage AJCC 8    05/09/2017 Genetic Testing    The patient had genetic testing due to a personal and family history of breast cancer.  The Common Hereditary Cancer Panel was ordered.  The Hereditary Gene Panel offered by [facility] includes sequencing and/or deletion duplication testing of the following 47 genes: APC, ATM, AXIN2, BARD1, BMPR1A, BRCA1, BRCA2, BRIP1, CDH1, CDKN2A (p14ARF), CDKN2A (p16INK4a), CKD4, CHEK2, CTNNA1, DICER1, EPCAM (Deletion/duplication testing only), GREM1 (promoter region deletion/duplication testing only), KIT, MEN1, MLH1, MSH2, MSH3, MSH6, MUTYH, NBN, NF1, NHTL1, PALB2, PDGFRA, PMS2, POLD1, POLE, PTEN, RAD50, RAD51C, RAD51D, SDHB, SDHC, SDHD, SMAD4, SMARCA4. STK11, TP53, TSC1, TSC2, and VHL.  The following genes were evaluated for sequence changes only: SDHA and HOXB13 c.251G>A variant only.  Results- No pathogenic variants identified.  A Variant of Uncertain Significance was identified in the gene APC c.-30626C>G (Promoter 1B).  The date of this test report is 05/09/2017.     06/20/2017 Surgery    Left mastectomy: Multifocal IDC with DCIS tumor size is 1.1, 1.6 and 2.4 cm, margins negative, 0/5 lymph nodes negative, ER 95%, PR 90%, HER-2 positive ratio 5.18, Ki-67 45%  T2N0 stage Ib pathologic stage    07/24/2017 - 11/09/2017 Chemotherapy    TCH P followed by Herceptin and Perjeta maintenance ( density discontinued after cycle 2 because of profound rash)     CHIEF COMPLIANT: Follow-up on Herceptin and Perjeta maintenance  INTERVAL HISTORY: Alexis Underwood is a 42 year old with above-mentioned history of left breast cancer currently on Herceptin Perjeta maintenance.  She appears to be tolerating it very well.  She had breast reconstruction surgery were done.  She also went on a vacation to [place].  She is ready to also begin tamoxifen therapy.  REVIEW OF SYSTEMS:   Constitutional: Denies fevers, chills or abnormal weight loss Eyes: Denies blurriness of vision Ears, nose, mouth, throat, and face: Denies mucositis or sore throat Respiratory: Denies cough, dyspnea or wheezes Cardiovascular: Denies palpitation, chest discomfort Gastrointestinal:  Denies nausea, heartburn or change in bowel habits Skin: Denies abnormal skin rashes Lymphatics: Denies new lymphadenopathy or easy bruising Neurological:Denies numbness, tingling or new weaknesses Behavioral/Psych: Mood is stable, no new changes  Extremities: No lower extremity edema   All other systems were reviewed with the patient and are negative.  I have reviewed the past medical history, past surgical history, social history and family history with the patient and they are unchanged from previous note.  ALLERGIES:  has No Known Allergies.  MEDICATIONS:  Current Outpatient Medications  Medication Sig Dispense Refill  . acetaminophen (TYLENOL) 325 MG tablet Take 650 mg by mouth daily as needed for headache.    . lovastatin (MEVACOR) 40 MG tablet Take 40 mg by mouth every evening.    . pantoprazole (PROTONIX)  40 MG tablet TAKE ONE (1) TABLET BY MOUTH EVERY DAY 30 tablet 3  . tamoxifen (NOLVADEX) 20 MG tablet Take 1 tablet (20 mg total) by mouth daily. 90 tablet 3   No current facility-administered  medications for this visit.     PHYSICAL EXAMINATION: ECOG PERFORMANCE STATUS: 1 - Symptomatic but completely ambulatory  Vitals:   02/19/18 1236  BP: (!) 149/90  Pulse: 78  Resp: 19  Temp: 98 F (36.7 C)  SpO2: 100%   Filed Weights   02/19/18 1236  Weight: 144 lb 11.2 oz (65.6 kg)    GENERAL:alert, no distress and comfortable SKIN: skin color, texture, turgor are normal, no rashes or significant lesions EYES: normal, Conjunctiva are pink and non-injected, sclera clear OROPHARYNX:no exudate, no erythema and lips, buccal mucosa, and tongue normal  NECK: supple, thyroid normal size, non-tender, without nodularity LYMPH:  no palpable lymphadenopathy in the cervical, axillary or inguinal LUNGS: clear to auscultation and percussion with normal breathing effort HEART: regular rate & rhythm and no murmurs and no lower extremity edema ABDOMEN:abdomen soft, non-tender and normal bowel sounds MUSCULOSKELETAL:no cyanosis of digits and no clubbing  NEURO: alert & oriented x 3 with fluent speech, no focal motor/sensory deficits EXTREMITIES: No lower extremity edema   LABORATORY DATA:  I have reviewed the data as listed CMP Latest Ref Rng & Units 02/19/2018 01/01/2018 11/20/2017  Glucose 70 - 99 mg/dL 127(H) 112(H) 118  BUN 6 - 20 mg/dL _0 Creatinine 0.44 - 1.00 mg/dL 0.84 0.82 0.82  Sodium 135 - 145 mmol/L 141 142 141  Potassium 3.5 - 5.1 mmol/L 3.4(L) 3.5 3.4(L)  Chloride 98 - 111 mmol/L 105 104 108  CO2 22 - 32 mmol/L _1 Calcium 8.9 - 10.3 mg/dL 8.7(L) 9.7 8.8  Total Protein 6.5 - 8.1 g/dL 6.7 6.9 6.6  Total Bilirubin 0.3 - 1.2 mg/dL 0.3 0.4 0.4  Alkaline Phos 38 - 126 U/L 106 134(H) 122  AST 15 - 41 U/L 18 34 22  ALT 0 - 44 U/L 19 43 24    Lab Results  Component Value Date   WBC 6.6 02/19/2018   HGB 12.1 02/19/2018   HCT 35.4 02/19/2018   MCV 102.1 (H) 02/19/2018   PLT 291 02/19/2018   NEUTROABS 4.6 02/19/2018    ASSESSMENT & PLAN:  Malignant neoplasm of  upper-outer quadrant of left breast in female, estrogen receptor positive ([place]) 06/20/2017 left mastectomy: 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 Adjuvant TCH Perjeta followed by Herceptin and Perjeta maintenance for 1 year 2. followed by adjuvant antiestrogen therapy -------------------------------------------------------------------- Current treatment: Herceptin Perjeta maintenance Patient will begin tamoxifen.  I sent a new prescription today.  Anemia: Due to heavy menstrual cycles and kidney stone and chemotherapy, today's hemoglobin is 12.1.  She received blood transfusion previously.  Return to clinic every 3 weeks for Herceptin Perjeta maintenance and every 6 weeks for follow-up with me     No orders of the defined types were placed in this encounter.  The patient has a good understanding of the overall plan. she agrees with it. she will call with any problems that may develop before the next visit here.   Harriette Ohara, MD 02/19/18

## 2018-02-19 NOTE — Assessment & Plan Note (Addendum)
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 Adjuvant TCH Perjeta followed by Herceptin and Perjeta maintenance for 1 year 2. followed by adjuvant antiestrogen therapy -------------------------------------------------------------------- Current treatment: Herceptin Perjeta maintenance Patient will begin tamoxifen.  Anemia: Due to heavy menstrual cycles and kidney stone and chemotherapy, today's hemoglobin is 10.9.  She received blood transfusion previously.  Return to clinic every 3 weeks for Herceptin Perjeta maintenance and every 6 weeks for follow-up with me  

## 2018-02-19 NOTE — Patient Instructions (Signed)
[facility] Discharge Instructions for Patients Receiving Chemotherapy  Today you received the following chemotherapy agents :  Herceptin, Perjeta.  To help prevent nausea and vomiting after your treatment, we encourage you to take your nausea medication as prescribed.   If you develop nausea and vomiting that is not controlled by your nausea medication, call the clinic.   BELOW ARE SYMPTOMS THAT SHOULD BE REPORTED IMMEDIATELY:  *FEVER GREATER THAN 100.5 F  *CHILLS WITH OR WITHOUT FEVER  NAUSEA AND VOMITING THAT IS NOT CONTROLLED WITH YOUR NAUSEA MEDICATION  *UNUSUAL SHORTNESS OF BREATH  *UNUSUAL BRUISING OR BLEEDING  TENDERNESS IN MOUTH AND THROAT WITH OR WITHOUT PRESENCE OF ULCERS  *URINARY PROBLEMS  *BOWEL PROBLEMS  UNUSUAL RASH Items with * indicate a potential emergency and should be followed up as soon as possible.  Feel free to call the clinic should you have any questions or concerns. The clinic phone number is [phone].  Please show the CHEMO ALERT CARD at check-in to the Emergency Department and triage nurse.   

## 2018-02-20 ENCOUNTER — Other Ambulatory Visit: Payer: Self-pay | Admitting: Obstetrics and Gynecology

## 2018-02-20 DIAGNOSIS — Z1231 Encounter for screening mammogram for malignant neoplasm of breast: Secondary | ICD-10-CM

## 2018-02-21 ENCOUNTER — Telehealth: Payer: Self-pay | Admitting: Hematology and Oncology

## 2018-02-21 NOTE — Telephone Encounter (Signed)
Printed medical records for [employer], Release ID: [id number]

## 2018-02-22 ENCOUNTER — Telehealth: Payer: Self-pay | Admitting: Hematology and Oncology

## 2018-02-22 NOTE — Telephone Encounter (Signed)
Patient requested appts to be moved to later on same day 9/24.

## 2018-03-12 ENCOUNTER — Inpatient Hospital Stay: Payer: BLUE CROSS/BLUE SHIELD | Attending: Hematology and Oncology

## 2018-03-12 VITALS — BP 144/91 | HR 72 | Temp 98.4°F | Resp 18

## 2018-03-12 DIAGNOSIS — Z170 Estrogen receptor positive status [ER+]: Secondary | ICD-10-CM

## 2018-03-12 DIAGNOSIS — Z5112 Encounter for antineoplastic immunotherapy: Secondary | ICD-10-CM | POA: Diagnosis present

## 2018-03-12 DIAGNOSIS — C50412 Malignant neoplasm of upper-outer quadrant of left female breast: Secondary | ICD-10-CM | POA: Diagnosis present

## 2018-03-12 MED ORDER — SODIUM CHLORIDE 0.9 % IV SOLN
Freq: Once | INTRAVENOUS | Status: AC
  Administered 2018-03-12: 09:00:00 via INTRAVENOUS
  Filled 2018-03-12: qty 250

## 2018-03-12 MED ORDER — SODIUM CHLORIDE 0.9 % IV SOLN
420.0000 mg | Freq: Once | INTRAVENOUS | Status: AC
  Administered 2018-03-12: 420 mg via INTRAVENOUS
  Filled 2018-03-12: qty 14

## 2018-03-12 MED ORDER — TRASTUZUMAB CHEMO 150 MG IV SOLR
6.0000 mg/kg | Freq: Once | INTRAVENOUS | Status: AC
  Administered 2018-03-12: 378 mg via INTRAVENOUS
  Filled 2018-03-12: qty 18

## 2018-03-12 MED ORDER — HEPARIN SOD (PORK) LOCK FLUSH 100 UNIT/ML IV SOLN
500.0000 [IU] | Freq: Once | INTRAVENOUS | Status: AC | PRN
  Administered 2018-03-12: 500 [IU]
  Filled 2018-03-12: qty 5

## 2018-03-12 MED ORDER — ACETAMINOPHEN 325 MG PO TABS
650.0000 mg | ORAL_TABLET | Freq: Once | ORAL | Status: AC
  Administered 2018-03-12: 650 mg via ORAL

## 2018-03-12 MED ORDER — DIPHENHYDRAMINE HCL 25 MG PO CAPS
ORAL_CAPSULE | ORAL | Status: AC
  Filled 2018-03-12: qty 1

## 2018-03-12 MED ORDER — SODIUM CHLORIDE 0.9% FLUSH
10.0000 mL | INTRAVENOUS | Status: DC | PRN
  Administered 2018-03-12: 10 mL
  Filled 2018-03-12: qty 10

## 2018-03-12 MED ORDER — ACETAMINOPHEN 325 MG PO TABS
ORAL_TABLET | ORAL | Status: AC
  Filled 2018-03-12: qty 2

## 2018-03-12 MED ORDER — DIPHENHYDRAMINE HCL 25 MG PO CAPS
25.0000 mg | ORAL_CAPSULE | Freq: Once | ORAL | Status: AC
  Administered 2018-03-12: 25 mg via ORAL

## 2018-03-12 NOTE — Patient Instructions (Signed)
[facility] Discharge Instructions for Patients Receiving Chemotherapy  Today you received the following chemotherapy agents Herceptin and Perjeta.   To help prevent nausea and vomiting after your treatment, we encourage you to take your nausea medication as directed.   If you develop nausea and vomiting that is not controlled by your nausea medication, call the clinic.   BELOW ARE SYMPTOMS THAT SHOULD BE REPORTED IMMEDIATELY:  *FEVER GREATER THAN 100.5 F  *CHILLS WITH OR WITHOUT FEVER  NAUSEA AND VOMITING THAT IS NOT CONTROLLED WITH YOUR NAUSEA MEDICATION  *UNUSUAL SHORTNESS OF BREATH  *UNUSUAL BRUISING OR BLEEDING  TENDERNESS IN MOUTH AND THROAT WITH OR WITHOUT PRESENCE OF ULCERS  *URINARY PROBLEMS  *BOWEL PROBLEMS  UNUSUAL RASH Items with * indicate a potential emergency and should be followed up as soon as possible.  Feel free to call the clinic should you have any questions or concerns. The clinic phone number is [phone].  Please show the CHEMO ALERT CARD at check-in to the Emergency Department and triage nurse.   

## 2018-04-02 ENCOUNTER — Other Ambulatory Visit: Payer: BLUE CROSS/BLUE SHIELD

## 2018-04-02 ENCOUNTER — Inpatient Hospital Stay: Payer: BLUE CROSS/BLUE SHIELD

## 2018-04-02 ENCOUNTER — Ambulatory Visit: Payer: BLUE CROSS/BLUE SHIELD

## 2018-04-02 ENCOUNTER — Ambulatory Visit: Payer: BLUE CROSS/BLUE SHIELD | Admitting: Hematology and Oncology

## 2018-04-02 ENCOUNTER — Inpatient Hospital Stay (HOSPITAL_BASED_OUTPATIENT_CLINIC_OR_DEPARTMENT_OTHER): Payer: BLUE CROSS/BLUE SHIELD | Admitting: Hematology and Oncology

## 2018-04-02 VITALS — BP 147/96 | HR 78 | Temp 97.8°F | Resp 16 | Ht 61.0 in | Wt 146.2 lb

## 2018-04-02 DIAGNOSIS — Z170 Estrogen receptor positive status [ER+]: Principal | ICD-10-CM

## 2018-04-02 DIAGNOSIS — Z5112 Encounter for antineoplastic immunotherapy: Secondary | ICD-10-CM | POA: Diagnosis not present

## 2018-04-02 DIAGNOSIS — K922 Gastrointestinal hemorrhage, unspecified: Secondary | ICD-10-CM

## 2018-04-02 DIAGNOSIS — Z9012 Acquired absence of left breast and nipple: Secondary | ICD-10-CM

## 2018-04-02 DIAGNOSIS — Z95828 Presence of other vascular implants and grafts: Secondary | ICD-10-CM

## 2018-04-02 DIAGNOSIS — D6481 Anemia due to antineoplastic chemotherapy: Secondary | ICD-10-CM

## 2018-04-02 DIAGNOSIS — R739 Hyperglycemia, unspecified: Secondary | ICD-10-CM

## 2018-04-02 DIAGNOSIS — D500 Iron deficiency anemia secondary to blood loss (chronic): Secondary | ICD-10-CM

## 2018-04-02 DIAGNOSIS — Z79810 Long term (current) use of selective estrogen receptor modulators (SERMs): Secondary | ICD-10-CM

## 2018-04-02 DIAGNOSIS — C50412 Malignant neoplasm of upper-outer quadrant of left female breast: Secondary | ICD-10-CM

## 2018-04-02 LAB — CBC WITH DIFFERENTIAL/PLATELET
Basophils Absolute: 0 10*3/uL (ref 0.0–0.1)
Basophils Relative: 0 %
EOS PCT: 1 %
Eosinophils Absolute: 0.1 10*3/uL (ref 0.0–0.5)
HEMATOCRIT: 35.6 % (ref 34.8–46.6)
Hemoglobin: 12.3 g/dL (ref 11.6–15.9)
LYMPHS ABS: 1.9 10*3/uL (ref 0.9–3.3)
LYMPHS PCT: 24 %
MCH: 33.6 pg (ref 25.1–34.0)
MCHC: 34.6 g/dL (ref 31.5–36.0)
MCV: 97.3 fL (ref 79.5–101.0)
MONO ABS: 0.7 10*3/uL (ref 0.1–0.9)
MONOS PCT: 10 %
NEUTROS ABS: 4.9 10*3/uL (ref 1.5–6.5)
Neutrophils Relative %: 65 %
PLATELETS: 223 10*3/uL (ref 145–400)
RBC: 3.66 MIL/uL — ABNORMAL LOW (ref 3.70–5.45)
RDW: 12 % (ref 11.2–14.5)
WBC: 7.7 10*3/uL (ref 3.9–10.3)

## 2018-04-02 LAB — COMPREHENSIVE METABOLIC PANEL
ALT: 15 U/L (ref 0–44)
AST: 18 U/L (ref 15–41)
Albumin: 3.5 g/dL (ref 3.5–5.0)
Alkaline Phosphatase: 93 U/L (ref 38–126)
Anion gap: 8 (ref 5–15)
BILIRUBIN TOTAL: 0.3 mg/dL (ref 0.3–1.2)
BUN: 12 mg/dL (ref 6–20)
CHLORIDE: 104 mmol/L (ref 98–111)
CO2: 29 mmol/L (ref 22–32)
CREATININE: 0.81 mg/dL (ref 0.44–1.00)
Calcium: 8.8 mg/dL — ABNORMAL LOW (ref 8.9–10.3)
GFR calc non Af Amer: >60 mL/min (ref >60)
Glucose, Bld: 127 mg/dL — ABNORMAL HIGH (ref 70–99)
POTASSIUM: 3.3 mmol/L — AB (ref 3.5–5.1)
Sodium: 141 mmol/L (ref 135–145)
TOTAL PROTEIN: 6.3 g/dL — AB (ref 6.5–8.1)

## 2018-04-02 LAB — LIPID PANEL
Cholesterol: 219 mg/dL — ABNORMAL HIGH (ref 0–200)
HDL: 40 mg/dL — AB (ref >40)
LDL CALC: 130 mg/dL — AB (ref 0–99)
Total CHOL/HDL Ratio: 5.5 RATIO
Triglycerides: 246 mg/dL — ABNORMAL HIGH (ref <150)
VLDL: 49 mg/dL — ABNORMAL HIGH (ref 0–40)

## 2018-04-02 LAB — HEMOGLOBIN A1C
HEMOGLOBIN A1C: 6 % — AB (ref 4.8–5.6)
Mean Plasma Glucose: 125.5 mg/dL

## 2018-04-02 MED ORDER — DIPHENHYDRAMINE HCL 25 MG PO CAPS
ORAL_CAPSULE | ORAL | Status: AC
  Filled 2018-04-02: qty 1

## 2018-04-02 MED ORDER — SODIUM CHLORIDE 0.9% FLUSH
10.0000 mL | INTRAVENOUS | Status: DC | PRN
  Administered 2018-04-02: 10 mL
  Filled 2018-04-02: qty 10

## 2018-04-02 MED ORDER — ACETAMINOPHEN 325 MG PO TABS
ORAL_TABLET | ORAL | Status: AC
  Filled 2018-04-02: qty 2

## 2018-04-02 MED ORDER — SODIUM CHLORIDE 0.9 % IV SOLN
420.0000 mg | Freq: Once | INTRAVENOUS | Status: AC
  Administered 2018-04-02: 420 mg via INTRAVENOUS
  Filled 2018-04-02: qty 14

## 2018-04-02 MED ORDER — SODIUM CHLORIDE 0.9% FLUSH
10.0000 mL | INTRAVENOUS | Status: DC | PRN
  Administered 2018-04-02: 10 mL via INTRAVENOUS
  Filled 2018-04-02: qty 10

## 2018-04-02 MED ORDER — DIPHENHYDRAMINE HCL 25 MG PO CAPS
25.0000 mg | ORAL_CAPSULE | Freq: Once | ORAL | Status: AC
  Administered 2018-04-02: 25 mg via ORAL

## 2018-04-02 MED ORDER — HEPARIN SOD (PORK) LOCK FLUSH 100 UNIT/ML IV SOLN
500.0000 [IU] | Freq: Once | INTRAVENOUS | Status: AC | PRN
  Administered 2018-04-02: 500 [IU]
  Filled 2018-04-02: qty 5

## 2018-04-02 MED ORDER — ACETAMINOPHEN 325 MG PO TABS
650.0000 mg | ORAL_TABLET | Freq: Once | ORAL | Status: AC
  Administered 2018-04-02: 650 mg via ORAL

## 2018-04-02 MED ORDER — SODIUM CHLORIDE 0.9 % IV SOLN
Freq: Once | INTRAVENOUS | Status: AC
  Administered 2018-04-02: 15:00:00 via INTRAVENOUS
  Filled 2018-04-02: qty 250

## 2018-04-02 MED ORDER — TRASTUZUMAB CHEMO 150 MG IV SOLR
6.0000 mg/kg | Freq: Once | INTRAVENOUS | Status: AC
  Administered 2018-04-02: 378 mg via INTRAVENOUS
  Filled 2018-04-02: qty 18

## 2018-04-02 NOTE — Assessment & Plan Note (Signed)
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 Adjuvant TCH Perjeta followed by Herceptin and Perjeta maintenance for 1 year 2. followed by adjuvant antiestrogen therapy -------------------------------------------------------------------- Current treatment:Herceptin Perjeta maintenance Tamoxifen started 02/19/2018.   Tamoxifen toxicities:  Anemia: Due to heavy menstrual cycles and kidney stone and chemotherapy, today's hemoglobin is 12.1. She received blood transfusion previously.  Return to clinic every 3 weeks for Herceptin Perjeta maintenance and every 6 weeks for follow-up with me

## 2018-04-02 NOTE — Patient Instructions (Signed)
[facility] Discharge Instructions for Patients Receiving Chemotherapy  Today you received the following chemotherapy agents: Trastuzumab (Herceptin) and Pertuzumab (Perjeta)  To help prevent nausea and vomiting after your treatment, we encourage you to take your nausea medication as directed.    If you develop nausea and vomiting that is not controlled by your nausea medication, call the clinic.   BELOW ARE SYMPTOMS THAT SHOULD BE REPORTED IMMEDIATELY:  *FEVER GREATER THAN 100.5 F  *CHILLS WITH OR WITHOUT FEVER  NAUSEA AND VOMITING THAT IS NOT CONTROLLED WITH YOUR NAUSEA MEDICATION  *UNUSUAL SHORTNESS OF BREATH  *UNUSUAL BRUISING OR BLEEDING  TENDERNESS IN MOUTH AND THROAT WITH OR WITHOUT PRESENCE OF ULCERS  *URINARY PROBLEMS  *BOWEL PROBLEMS  UNUSUAL RASH Items with * indicate a potential emergency and should be followed up as soon as possible.  Feel free to call the clinic should you have any questions or concerns. The clinic phone number is [phone].  Please show the CHEMO ALERT CARD at check-in to the Emergency Department and triage nurse.   

## 2018-04-02 NOTE — Progress Notes (Signed)
Patient Care Team: Carol Ada, MD as PCP - General (Family Medicine)  DIAGNOSIS:  Encounter Diagnoses  Name Primary?  . Malignant neoplasm of upper-outer quadrant of left breast in female, estrogen receptor positive (Alexis Underwood)   . Elevated blood sugar Yes    SUMMARY OF ONCOLOGIC HISTORY:   Malignant neoplasm of upper-outer quadrant of left breast in female, estrogen receptor positive (Alexis Underwood)   04/16/2017 Initial Diagnosis    Screening mammogram detected calcifications left breast UOQ 9.3 cm, at 2:00: 1.4 cm and at 1:00: 8 mm; biopsy of mass at 1:00: IDC grade 2 with DCIS ER 95%, PR 90%,Ki-67 45%, HER-2 positive ratio 5.18; iopsy of the 2:00 mass and calcifications were DCIS with suspicion for microinvasion, T1 cN0 stage IA clinical stage AJCC 8    05/09/2017 Genetic Testing    The patient had genetic testing due to a personal and family history of breast cancer.  The Common Hereditary Cancer Panel was ordered.  The Hereditary Gene Panel offered by [facility] includes sequencing and/or deletion duplication testing of the following 47 genes: APC, ATM, AXIN2, BARD1, BMPR1A, BRCA1, BRCA2, BRIP1, CDH1, CDKN2A (p14ARF), CDKN2A (p16INK4a), CKD4, CHEK2, CTNNA1, DICER1, EPCAM (Deletion/duplication testing only), GREM1 (promoter region deletion/duplication testing only), KIT, MEN1, MLH1, MSH2, MSH3, MSH6, MUTYH, NBN, NF1, NHTL1, PALB2, PDGFRA, PMS2, POLD1, POLE, PTEN, RAD50, RAD51C, RAD51D, SDHB, SDHC, SDHD, SMAD4, SMARCA4. STK11, TP53, TSC1, TSC2, and VHL.  The following genes were evaluated for sequence changes only: SDHA and HOXB13 c.251G>A variant only.  Results- No pathogenic variants identified.  A Variant of Uncertain Significance was identified in the gene APC c.-30626C>G (Promoter 1B).  The date of this test report is 05/09/2017.     06/20/2017 Surgery    Left mastectomy: Multifocal IDC with DCIS tumor size is 1.1, 1.6 and 2.4 cm, margins negative, 0/5 lymph nodes negative, ER 95%, PR 90%, HER-2  positive ratio 5.18, Ki-67 45% T2N0 stage Ib pathologic stage    07/24/2017 - 11/09/2017 Chemotherapy    TCH P followed by Herceptin and Perjeta maintenance ( density discontinued after cycle 2 because of profound rash)     CHIEF COMPLIANT: Herceptin and Perjeta maintenance  INTERVAL HISTORY: Alexis Underwood is a 42 year old with above-mentioned history of left breast cancer treated with neoadjuvant chemotherapy followed by mastectomy and is now on Herceptin Perjeta maintenance.  She is tolerating the treatment fairly well.  She does have loose stools once per day.  Otherwise she denies any other complaints or concerns.  REVIEW OF SYSTEMS:   Constitutional: Denies fevers, chills or abnormal weight loss Eyes: Denies blurriness of vision Ears, nose, mouth, throat, and face: Denies mucositis or sore throat Respiratory: Denies cough, dyspnea or wheezes Cardiovascular: Denies palpitation, chest discomfort Gastrointestinal: Diarrhea Skin: Denies abnormal skin rashes Lymphatics: Denies new lymphadenopathy or easy bruising Neurological:Denies numbness, tingling or new weaknesses Behavioral/Psych: Mood is stable, no new changes  Extremities: No lower extremity edema Breast:  denies any pain or lumps or nodules in either breasts All other systems were reviewed with the patient and are negative.  I have reviewed the past medical history, past surgical history, social history and family history with the patient and they are unchanged from previous note.  ALLERGIES:  has No Known Allergies.  MEDICATIONS:  Current Outpatient Medications  Medication Sig Dispense Refill  . acetaminophen (TYLENOL) 325 MG tablet Take 650 mg by mouth daily as needed for headache.    . lovastatin (MEVACOR) 40 MG tablet Take 40 mg by mouth every evening.    Marland Kitchen  pantoprazole (PROTONIX) 40 MG tablet TAKE ONE (1) TABLET BY MOUTH EVERY DAY 30 tablet 3  . tamoxifen (NOLVADEX) 20 MG tablet Take 1 tablet (20 mg total) by mouth  daily. 90 tablet 3   No current facility-administered medications for this visit.     PHYSICAL EXAMINATION: ECOG PERFORMANCE STATUS: 1 - Symptomatic but completely ambulatory  Vitals:   04/02/18 1336  BP: (!) 147/96  Pulse: 78  Resp: 16  Temp: 97.8 F (36.6 C)  SpO2: 98%   Filed Weights   04/02/18 1336  Weight: 146 lb 3.2 oz (66.3 kg)    GENERAL:alert, no distress and comfortable SKIN: skin color, texture, turgor are normal, no rashes or significant lesions EYES: normal, Conjunctiva are pink and non-injected, sclera clear OROPHARYNX:no exudate, no erythema and lips, buccal mucosa, and tongue normal  NECK: supple, thyroid normal size, non-tender, without nodularity LYMPH:  no palpable lymphadenopathy in the cervical, axillary or inguinal LUNGS: clear to auscultation and percussion with normal breathing effort HEART: regular rate & rhythm and no murmurs and no lower extremity edema ABDOMEN:abdomen soft, non-tender and normal bowel sounds MUSCULOSKELETAL:no cyanosis of digits and no clubbing  NEURO: alert & oriented x 3 with fluent speech, no focal motor/sensory deficits EXTREMITIES: No lower extremity edema BREAST: No palpable masses or nodules in either right or left breasts. No palpable axillary supraclavicular or infraclavicular adenopathy no breast tenderness or nipple discharge. (exam performed in the presence of a chaperone)  LABORATORY DATA:  I have reviewed the data as listed CMP Latest Ref Rng & Units 04/02/2018 02/19/2018 01/01/2018  Glucose 70 - 99 mg/dL 127(H) 127(H) 112(H)  BUN 6 - 20 mg/dL _0 Creatinine 0.44 - 1.00 mg/dL 0.81 0.84 0.82  Sodium 135 - 145 mmol/L 141 141 142  Potassium 3.5 - 5.1 mmol/L 3.3(L) 3.4(L) 3.5  Chloride 98 - 111 mmol/L 104 105 104  CO2 22 - 32 mmol/L _1 Calcium 8.9 - 10.3 mg/dL 8.8(L) 8.7(L) 9.7  Total Protein 6.5 - 8.1 g/dL 6.3(L) 6.7 6.9  Total Bilirubin 0.3 - 1.2 mg/dL 0.3 0.3 0.4  Alkaline Phos 38 - 126 U/L 93 106  134(H)  AST 15 - 41 U/L 18 18 34  ALT 0 - 44 U/L 15 19 43    Lab Results  Component Value Date   WBC 7.7 04/02/2018   HGB 12.3 04/02/2018   HCT 35.6 04/02/2018   MCV 97.3 04/02/2018   PLT 223 04/02/2018   NEUTROABS 4.9 04/02/2018    ASSESSMENT & PLAN:  Malignant neoplasm of upper-outer quadrant of left breast in female, estrogen receptor positive ([place]) 06/20/2017 left mastectomy: 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 Adjuvant TCH Perjeta followed by Herceptin and Perjeta maintenance for 1 year 2. followed by adjuvant antiestrogen therapy -------------------------------------------------------------------- Current treatment:Herceptin Perjeta maintenance Tamoxifen started 02/19/2018.   Tamoxifen toxicities: Tolerating extremely well.  Does not have any hot flashes or myalgias. Anemia: Due to heavy menstrual cycles and kidney stone and chemotherapy, today's hemoglobin is 12.1. She received blood transfusion previously.  Return to clinic every 3 weeks for Herceptin Perjeta maintenance and every 6 weeks for follow-up with me    Orders Placed This Encounter  Procedures  . Hemoglobin A1c    Standing Status:   Future    Standing Expiration Date:  04/02/2019  . Lipid panel    Standing Status:   Future    Standing Expiration Date:   04/02/2019   The patient has a good understanding of the overall plan. she agrees with it. she will call with any problems that may develop before the next visit here.   Harriette Ohara, MD 04/02/18

## 2018-04-15 ENCOUNTER — Ambulatory Visit: Payer: BLUE CROSS/BLUE SHIELD

## 2018-04-15 ENCOUNTER — Ambulatory Visit
Admission: RE | Admit: 2018-04-15 | Discharge: 2018-04-15 | Disposition: A | Discharge status: Home / Self Care | Payer: BLUE CROSS/BLUE SHIELD | Source: Ambulatory Visit | Attending: Obstetrics and Gynecology | Admitting: Obstetrics and Gynecology

## 2018-04-15 DIAGNOSIS — Z1231 Encounter for screening mammogram for malignant neoplasm of breast: Secondary | ICD-10-CM

## 2018-04-23 ENCOUNTER — Inpatient Hospital Stay: Payer: BLUE CROSS/BLUE SHIELD | Attending: Hematology and Oncology

## 2018-04-23 ENCOUNTER — Telehealth: Payer: Self-pay | Admitting: *Deleted

## 2018-04-23 VITALS — BP 135/83 | HR 77 | Temp 98.0°F | Resp 16

## 2018-04-23 DIAGNOSIS — Z5112 Encounter for antineoplastic immunotherapy: Secondary | ICD-10-CM | POA: Insufficient documentation

## 2018-04-23 DIAGNOSIS — Z170 Estrogen receptor positive status [ER+]: Secondary | ICD-10-CM

## 2018-04-23 DIAGNOSIS — C50412 Malignant neoplasm of upper-outer quadrant of left female breast: Secondary | ICD-10-CM | POA: Insufficient documentation

## 2018-04-23 MED ORDER — DIPHENHYDRAMINE HCL 25 MG PO CAPS
25.0000 mg | ORAL_CAPSULE | Freq: Once | ORAL | Status: AC
  Administered 2018-04-23: 25 mg via ORAL

## 2018-04-23 MED ORDER — ACETAMINOPHEN 325 MG PO TABS
ORAL_TABLET | ORAL | Status: AC
  Filled 2018-04-23: qty 2

## 2018-04-23 MED ORDER — SODIUM CHLORIDE 0.9% FLUSH
10.0000 mL | INTRAVENOUS | Status: DC | PRN
  Administered 2018-04-23: 10 mL
  Filled 2018-04-23: qty 10

## 2018-04-23 MED ORDER — HEPARIN SOD (PORK) LOCK FLUSH 100 UNIT/ML IV SOLN
500.0000 [IU] | Freq: Once | INTRAVENOUS | Status: AC | PRN
  Administered 2018-04-23: 500 [IU]
  Filled 2018-04-23: qty 5

## 2018-04-23 MED ORDER — TRASTUZUMAB CHEMO 150 MG IV SOLR
6.0000 mg/kg | Freq: Once | INTRAVENOUS | Status: AC
  Administered 2018-04-23: 378 mg via INTRAVENOUS
  Filled 2018-04-23: qty 18

## 2018-04-23 MED ORDER — SODIUM CHLORIDE 0.9 % IV SOLN
Freq: Once | INTRAVENOUS | Status: AC
  Administered 2018-04-23: 10:00:00 via INTRAVENOUS
  Filled 2018-04-23: qty 250

## 2018-04-23 MED ORDER — ACETAMINOPHEN 325 MG PO TABS
650.0000 mg | ORAL_TABLET | Freq: Once | ORAL | Status: AC
  Administered 2018-04-23: 650 mg via ORAL

## 2018-04-23 MED ORDER — SODIUM CHLORIDE 0.9 % IV SOLN
420.0000 mg | Freq: Once | INTRAVENOUS | Status: AC
  Administered 2018-04-23: 420 mg via INTRAVENOUS
  Filled 2018-04-23: qty 14

## 2018-04-23 MED ORDER — DIPHENHYDRAMINE HCL 25 MG PO CAPS
ORAL_CAPSULE | ORAL | Status: AC
  Filled 2018-04-23: qty 1

## 2018-04-23 NOTE — Patient Instructions (Signed)
[facility] Discharge Instructions for Patients Receiving Chemotherapy  Today you received the following chemotherapy agents: Trastuzumab (Herceptin) and Pertuzumab (Perjeta)  To help prevent nausea and vomiting after your treatment, we encourage you to take your nausea medication as directed.    If you develop nausea and vomiting that is not controlled by your nausea medication, call the clinic.   BELOW ARE SYMPTOMS THAT SHOULD BE REPORTED IMMEDIATELY:  *FEVER GREATER THAN 100.5 F  *CHILLS WITH OR WITHOUT FEVER  NAUSEA AND VOMITING THAT IS NOT CONTROLLED WITH YOUR NAUSEA MEDICATION  *UNUSUAL SHORTNESS OF BREATH  *UNUSUAL BRUISING OR BLEEDING  TENDERNESS IN MOUTH AND THROAT WITH OR WITHOUT PRESENCE OF ULCERS  *URINARY PROBLEMS  *BOWEL PROBLEMS  UNUSUAL RASH Items with * indicate a potential emergency and should be followed up as soon as possible.  Feel free to call the clinic should you have any questions or concerns. The clinic phone number is [phone].  Please show the CHEMO ALERT CARD at check-in to the Emergency Department and triage nurse.   

## 2018-04-23 NOTE — Telephone Encounter (Signed)
Medical records faxed to [facility]; release [id number]

## 2018-04-25 NOTE — Progress Notes (Signed)
FMLA successfully faxed to [employer] at [phone]. Mailed copy to patient address on file. 

## 2018-04-29 ENCOUNTER — Ambulatory Visit (HOSPITAL_COMMUNITY)
Admission: RE | Admit: 2018-04-29 | Discharge: 2018-04-29 | Disposition: A | Discharge status: Home / Self Care | Payer: BLUE CROSS/BLUE SHIELD | Source: Ambulatory Visit | Attending: Internal Medicine | Admitting: Internal Medicine

## 2018-04-29 ENCOUNTER — Encounter (HOSPITAL_COMMUNITY): Payer: Self-pay | Admitting: Internal Medicine

## 2018-04-29 ENCOUNTER — Other Ambulatory Visit: Payer: Self-pay

## 2018-04-29 VITALS — BP 133/88 | HR 81 | Wt 145.0 lb

## 2018-04-29 DIAGNOSIS — C50412 Malignant neoplasm of upper-outer quadrant of left female breast: Secondary | ICD-10-CM | POA: Diagnosis not present

## 2018-04-29 DIAGNOSIS — I10 Essential (primary) hypertension: Secondary | ICD-10-CM

## 2018-04-29 DIAGNOSIS — Z9882 Breast implant status: Secondary | ICD-10-CM | POA: Diagnosis not present

## 2018-04-29 DIAGNOSIS — Z803 Family history of malignant neoplasm of breast: Secondary | ICD-10-CM | POA: Diagnosis not present

## 2018-04-29 DIAGNOSIS — E785 Hyperlipidemia, unspecified: Secondary | ICD-10-CM | POA: Insufficient documentation

## 2018-04-29 DIAGNOSIS — Z808 Family history of malignant neoplasm of other organs or systems: Secondary | ICD-10-CM | POA: Insufficient documentation

## 2018-04-29 DIAGNOSIS — R030 Elevated blood-pressure reading, without diagnosis of hypertension: Secondary | ICD-10-CM | POA: Diagnosis not present

## 2018-04-29 DIAGNOSIS — Z79899 Other long term (current) drug therapy: Secondary | ICD-10-CM | POA: Diagnosis not present

## 2018-04-29 DIAGNOSIS — Z8249 Family history of ischemic heart disease and other diseases of the circulatory system: Secondary | ICD-10-CM | POA: Diagnosis not present

## 2018-04-29 NOTE — Addendum Note (Signed)
Encounter addended by: Scarlette Calico, RN on: 04/29/2018 3:45 PM  Actions taken: Sign clinical note

## 2018-04-29 NOTE — Patient Instructions (Signed)
Follow up as needed

## 2018-04-29 NOTE — Progress Notes (Signed)
  Echocardiogram 2D Echocardiogram has been performed.  Bobbye Charleston 04/29/2018, 3:07 PM

## 2018-04-29 NOTE — Progress Notes (Addendum)
CARDIO-ONCOLOGY CLINIC  NOTE  Referring Physician: Lindi Adie   HPI: Alexis Underwood is a 42 y.o. 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  Denies any significant PMHx other than her breast CA.   Did not tolerate taxotere due to severe rash. Finished carbo, hercetptin and Perjeta in 6/19. Now getting Herceptin and Perjeta. Had reconstruction 7/5.   She presents today for follow up with Echo. BPs running 130-140/80-90s at [facility]. No SOB or CP. No edema.  Finishes Herceptin/Perjeta in January  Echo today EF 60-65% -21.0% Personally reviewed  Echo 7/19: EF 60-65% GLS -18.1% (Underestimated due to limited images) % Personally reviewed Echo 09/13/17: EF 60-65% GLS -21.1%  Echo (06/14/17); EF 55-60% normal RV. Normal diastolic function. GLS -23.3. LS not adequate  SUMMARY OF ONCOLOGIC HISTORY:       Malignant neoplasm of upper-outer quadrant of left breast in female, estrogen receptor positive ([place])   04/16/2017 Initial Diagnosis    Screening mammogram detected calcifications left breast UOQ 9.3 cm, at 2:00: 1.4 cm and at 1:00: 8 mm; biopsy of mass at 1:00: IDC grade 2 with DCIS ER 95%, PR 90%,Ki-67 45%, HER-2 positive ratio 5.18; iopsy of the 2:00 mass and calcifications were DCIS with suspicion for microinvasion, T1 cN0 stage IA clinical stage AJCC 8      05/09/2017 Genetic Testing    The patient had genetic testing due to a personal and family history of breast cancer.  The Common Hereditary Cancer Panel was ordered.  The Hereditary Gene Panel offered by [facility] includes sequencing and/or deletion duplication testing of the following 47 genes: APC, ATM, AXIN2, BARD1, BMPR1A, BRCA1, BRCA2, BRIP1, CDH1, CDKN2A (p14ARF), CDKN2A (p16INK4a), CKD4, CHEK2, CTNNA1, DICER1, EPCAM (Deletion/duplication testing only), GREM1 (promoter region deletion/duplication testing  only), KIT, MEN1, MLH1, MSH2, MSH3, MSH6, MUTYH, NBN, NF1, NHTL1, PALB2, PDGFRA, PMS2, POLD1, POLE, PTEN, RAD50, RAD51C, RAD51D, SDHB, SDHC, SDHD, SMAD4, SMARCA4. STK11, TP53, TSC1, TSC2, and VHL.  The following genes were evaluated for sequence changes only: SDHA and HOXB13 c.251G>A variant only.  Results- No pathogenic variants identified.  A Variant of Uncertain Significance was identified in the gene APC c.-30626C>G (Promoter 1B).  The date of this test report is 05/09/2017.       06/20/2017 Surgery    Left mastectomy: Multifocal IDC with DCIS tumor size is 1.1, 1.6 and 2.4 cm, margins negative, 0/5 lymph nodes negative, ER 95%, PR 90%, HER-2 positive ratio 5.18, Ki-67 45% T2N0 stage Ib pathologic stage      07/24/2017 -  Chemotherapy    TCH P followed by Herceptin and Perjeta maintenance        Past Medical History:  Diagnosis Date  . Breast cancer ([place])   . Family history of breast cancer   . Family history of throat cancer   . History of kidney stones   . Hyperlipidemia   . SVD (spontaneous vaginal delivery)     Current Outpatient Medications  Medication Sig Dispense Refill  . acetaminophen (TYLENOL) 325 MG tablet Take 650 mg by mouth daily as needed for headache.    . lovastatin (MEVACOR) 40 MG tablet Take 40 mg by mouth every evening.    . pantoprazole (PROTONIX) 40 MG tablet TAKE ONE (1) TABLET BY MOUTH EVERY DAY 30 tablet 3  . tamoxifen (NOLVADEX) 20 MG tablet Take 1 tablet (20 mg total) by mouth daily. 90 tablet 3   No  current facility-administered medications for this encounter.    No Known Allergies   Social History   Socioeconomic History  . Marital status: Married    Spouse name: Not on file  . Number of children: Not on file  . Years of education: Not on file  . Highest education level: Not on file  Occupational History  . Not on file  Social Needs  . Financial resource strain: Not on file  . Food insecurity:    Worry: Not on file     Inability: Not on file  . Transportation needs:    Medical: Not on file    Non-medical: Not on file  Tobacco Use  . Smoking status: Never Smoker  . Smokeless tobacco: Never Used  Substance and Sexual Activity  . Alcohol use: No  . Drug use: No  . Sexual activity: Not on file  Lifestyle  . Physical activity:    Days per week: Not on file    Minutes per session: Not on file  . Stress: Not on file  Relationships  . Social connections:    Talks on phone: Not on file    Gets together: Not on file    Attends religious service: Not on file    Active member of club or organization: Not on file    Attends meetings of clubs or organizations: Not on file    Relationship status: Not on file  . Intimate partner violence:    Fear of current or ex partner: Not on file    Emotionally abused: Not on file    Physically abused: Not on file    Forced sexual activity: Not on file  Other Topics Concern  . Not on file  Social History Narrative  . Not on file      Family History  Problem Relation Age of Onset  . Heart attack Mother   . Hyperlipidemia Mother   . Hypertension Mother   . Heart failure Mother   . Breast cancer Paternal Grandmother 22  . Heart disease Maternal Grandfather        died in 6's  . Throat cancer Paternal Grandfather 72   Vitals:   04/29/18 1516  BP: 133/88  Pulse: 81  SpO2: 100%  Weight: 65.8 kg (145 lb)    Wt Readings from Last 3 Encounters:  04/02/18 66.3 kg (146 lb 3.2 oz)  02/19/18 65.6 kg (144 lb 11.2 oz)  01/30/18 65.3 kg (144 lb)    PHYSICAL EXAM: General:  Well appearing. No resp difficulty HEENT: normal Neck: supple. no JVD. Carotids 2+ bilat; no bruits. No lymphadenopathy or thryomegaly appreciated. Cor: PMI nondisplaced. Regular rate & rhythm. No rubs, gallops or murmurs. Lungs: clear Abdomen: soft, nontender, nondistended. No hepatosplenomegaly. No bruits or masses. Good bowel sounds. Extremities: no cyanosis, clubbing, rash, trace  edema Neuro: alert & orientedx3, cranial nerves grossly intact. moves all 4 extremities w/o difficulty. Affect pleasant   ASSESSMENT & PLAN: 1. Left breast Cancer, triple positive - ER 95%, PR 90%,Ki-67 45%, HER-2 positive ratio 5.18; - T1 cN0 stage IA clinical stage AJCC 8 - Echo today EF 60-65% GLS -21.0%  - Completes Herceptin and Perjeta in January can f/u PRN.   2. High blood pressure - SBPs at cancer center running 135-149.  - Will need to consider anti-HTN if SBP consistently over 140. Will defer to PCP. Can consider spiro 12.'5mg'$  daily if needed.   Glori Bickers, MD  3:39 PM

## 2018-05-01 ENCOUNTER — Encounter (HOSPITAL_COMMUNITY): Payer: BLUE CROSS/BLUE SHIELD | Admitting: Internal Medicine

## 2018-05-01 ENCOUNTER — Other Ambulatory Visit (HOSPITAL_COMMUNITY): Payer: BLUE CROSS/BLUE SHIELD

## 2018-05-14 ENCOUNTER — Inpatient Hospital Stay: Payer: BLUE CROSS/BLUE SHIELD

## 2018-05-14 ENCOUNTER — Inpatient Hospital Stay (HOSPITAL_BASED_OUTPATIENT_CLINIC_OR_DEPARTMENT_OTHER): Payer: BLUE CROSS/BLUE SHIELD | Admitting: Hematology and Oncology

## 2018-05-14 ENCOUNTER — Telehealth: Payer: Self-pay | Admitting: Hematology and Oncology

## 2018-05-14 ENCOUNTER — Inpatient Hospital Stay: Payer: BLUE CROSS/BLUE SHIELD | Attending: Hematology and Oncology

## 2018-05-14 ENCOUNTER — Encounter: Payer: Self-pay | Admitting: *Deleted

## 2018-05-14 VITALS — BP 138/91

## 2018-05-14 DIAGNOSIS — Z95828 Presence of other vascular implants and grafts: Secondary | ICD-10-CM

## 2018-05-14 DIAGNOSIS — Z9012 Acquired absence of left breast and nipple: Secondary | ICD-10-CM | POA: Diagnosis not present

## 2018-05-14 DIAGNOSIS — Z170 Estrogen receptor positive status [ER+]: Secondary | ICD-10-CM

## 2018-05-14 DIAGNOSIS — C50412 Malignant neoplasm of upper-outer quadrant of left female breast: Secondary | ICD-10-CM | POA: Diagnosis present

## 2018-05-14 DIAGNOSIS — Z5111 Encounter for antineoplastic chemotherapy: Secondary | ICD-10-CM | POA: Insufficient documentation

## 2018-05-14 DIAGNOSIS — Z5112 Encounter for antineoplastic immunotherapy: Secondary | ICD-10-CM | POA: Diagnosis present

## 2018-05-14 DIAGNOSIS — N951 Menopausal and female climacteric states: Secondary | ICD-10-CM

## 2018-05-14 DIAGNOSIS — Z79810 Long term (current) use of selective estrogen receptor modulators (SERMs): Secondary | ICD-10-CM

## 2018-05-14 LAB — CBC WITH DIFFERENTIAL/PLATELET
ABS IMMATURE GRANULOCYTES: 0.04 10*3/uL (ref 0.00–0.07)
BASOS ABS: 0 10*3/uL (ref 0.0–0.1)
Basophils Relative: 1 %
Eosinophils Absolute: 0.2 10*3/uL (ref 0.0–0.5)
Eosinophils Relative: 3 %
HCT: 35.7 % — ABNORMAL LOW (ref 36.0–46.0)
HEMOGLOBIN: 12.1 g/dL (ref 12.0–15.0)
Immature Granulocytes: 1 %
LYMPHS PCT: 26 %
Lymphs Abs: 1.7 10*3/uL (ref 0.7–4.0)
MCH: 33 pg (ref 26.0–34.0)
MCHC: 33.9 g/dL (ref 30.0–36.0)
MCV: 97.3 fL (ref 80.0–100.0)
Monocytes Absolute: 0.7 10*3/uL (ref 0.1–1.0)
Monocytes Relative: 10 %
NEUTROS ABS: 4 10*3/uL (ref 1.7–7.7)
NRBC: 0 % (ref 0.0–0.2)
Neutrophils Relative %: 59 %
PLATELETS: 230 10*3/uL (ref 150–400)
RBC: 3.67 MIL/uL — AB (ref 3.87–5.11)
RDW: 12.1 % (ref 11.5–15.5)
WBC: 6.6 10*3/uL (ref 4.0–10.5)

## 2018-05-14 LAB — COMPREHENSIVE METABOLIC PANEL
ALT: 16 U/L (ref 0–44)
AST: 17 U/L (ref 15–41)
Albumin: 3.4 g/dL — ABNORMAL LOW (ref 3.5–5.0)
Alkaline Phosphatase: 86 U/L (ref 38–126)
Anion gap: 10 (ref 5–15)
BUN: 10 mg/dL (ref 6–20)
CHLORIDE: 105 mmol/L (ref 98–111)
CO2: 25 mmol/L (ref 22–32)
Calcium: 8.6 mg/dL — ABNORMAL LOW (ref 8.9–10.3)
Creatinine, Ser: 0.96 mg/dL (ref 0.44–1.00)
Glucose, Bld: 180 mg/dL — ABNORMAL HIGH (ref 70–99)
POTASSIUM: 3.4 mmol/L — AB (ref 3.5–5.1)
SODIUM: 140 mmol/L (ref 135–145)
Total Bilirubin: <0.2 mg/dL — ABNORMAL LOW (ref 0.3–1.2)
Total Protein: 6.2 g/dL — ABNORMAL LOW (ref 6.5–8.1)

## 2018-05-14 MED ORDER — DIPHENHYDRAMINE HCL 25 MG PO CAPS
ORAL_CAPSULE | ORAL | Status: AC
  Filled 2018-05-14: qty 1

## 2018-05-14 MED ORDER — ACETAMINOPHEN 325 MG PO TABS
ORAL_TABLET | ORAL | Status: AC
  Filled 2018-05-14: qty 2

## 2018-05-14 MED ORDER — SODIUM CHLORIDE 0.9 % IV SOLN
Freq: Once | INTRAVENOUS | Status: AC
  Administered 2018-05-14: 10:00:00 via INTRAVENOUS
  Filled 2018-05-14: qty 250

## 2018-05-14 MED ORDER — SODIUM CHLORIDE 0.9% FLUSH
10.0000 mL | INTRAVENOUS | Status: DC | PRN
  Administered 2018-05-14: 10 mL via INTRAVENOUS
  Filled 2018-05-14: qty 10

## 2018-05-14 MED ORDER — HEPARIN SOD (PORK) LOCK FLUSH 100 UNIT/ML IV SOLN
500.0000 [IU] | Freq: Once | INTRAVENOUS | Status: AC | PRN
  Administered 2018-05-14: 500 [IU]
  Filled 2018-05-14: qty 5

## 2018-05-14 MED ORDER — SODIUM CHLORIDE 0.9% FLUSH
10.0000 mL | INTRAVENOUS | Status: DC | PRN
  Administered 2018-05-14: 10 mL
  Filled 2018-05-14: qty 10

## 2018-05-14 MED ORDER — TRASTUZUMAB CHEMO 150 MG IV SOLR
6.0000 mg/kg | Freq: Once | INTRAVENOUS | Status: AC
  Administered 2018-05-14: 378 mg via INTRAVENOUS
  Filled 2018-05-14: qty 18

## 2018-05-14 MED ORDER — DIPHENHYDRAMINE HCL 25 MG PO CAPS
25.0000 mg | ORAL_CAPSULE | Freq: Once | ORAL | Status: AC
  Administered 2018-05-14: 25 mg via ORAL

## 2018-05-14 MED ORDER — ACETAMINOPHEN 325 MG PO TABS
650.0000 mg | ORAL_TABLET | Freq: Once | ORAL | Status: AC
  Administered 2018-05-14: 650 mg via ORAL

## 2018-05-14 MED ORDER — SODIUM CHLORIDE 0.9 % IV SOLN
420.0000 mg | Freq: Once | INTRAVENOUS | Status: AC
  Administered 2018-05-14: 420 mg via INTRAVENOUS
  Filled 2018-05-14: qty 14

## 2018-05-14 NOTE — Progress Notes (Signed)
Patient Care Team: Carol Ada, MD as PCP - General (Family Medicine)  DIAGNOSIS:    ICD-10-CM   1. Malignant neoplasm of upper-outer quadrant of left breast in female, estrogen receptor positive ([place]) C50.412    Z17.0     SUMMARY OF ONCOLOGIC HISTORY:   Malignant neoplasm of upper-outer quadrant of left breast in female, estrogen receptor positive ([place])   04/16/2017 Initial Diagnosis    Screening mammogram detected calcifications left breast UOQ 9.3 cm, at 2:00: 1.4 cm and at 1:00: 8 mm; biopsy of mass at 1:00: IDC grade 2 with DCIS ER 95%, PR 90%,Ki-67 45%, HER-2 positive ratio 5.18; iopsy of the 2:00 mass and calcifications were DCIS with suspicion for microinvasion, T1 cN0 stage IA clinical stage AJCC 8    05/09/2017 Genetic Testing    The patient had genetic testing due to a personal and family history of breast cancer.  The Common Hereditary Cancer Panel was ordered.  The Hereditary Gene Panel offered by [facility] includes sequencing and/or deletion duplication testing of the following 47 genes: APC, ATM, AXIN2, BARD1, BMPR1A, BRCA1, BRCA2, BRIP1, CDH1, CDKN2A (p14ARF), CDKN2A (p16INK4a), CKD4, CHEK2, CTNNA1, DICER1, EPCAM (Deletion/duplication testing only), GREM1 (promoter region deletion/duplication testing only), KIT, MEN1, MLH1, MSH2, MSH3, MSH6, MUTYH, NBN, NF1, NHTL1, PALB2, PDGFRA, PMS2, POLD1, POLE, PTEN, RAD50, RAD51C, RAD51D, SDHB, SDHC, SDHD, SMAD4, SMARCA4. STK11, TP53, TSC1, TSC2, and VHL.  The following genes were evaluated for sequence changes only: SDHA and HOXB13 c.251G>A variant only.  Results- No pathogenic variants identified.  A Variant of Uncertain Significance was identified in the gene APC c.-30626C>G (Promoter 1B).  The date of this test report is 05/09/2017.     06/20/2017 Surgery    Left mastectomy: Multifocal IDC with DCIS tumor size is 1.1, 1.6 and 2.4 cm, margins negative, 0/5 lymph nodes negative, ER 95%, PR 90%, HER-2 positive ratio 5.18, Ki-67 45% T2N0  stage Ib pathologic stage    07/24/2017 - 11/09/2017 Chemotherapy    TCH P followed by Herceptin and Perjeta maintenance ( density discontinued after cycle 2 because of profound rash)     CHIEF COMPLIANT:  Herceptin and Perjeta maintenance  INTERVAL HISTORY: Alexis Underwood is a 42 y.o. with above-mentioned history of left breast cancer treated with neoadjuvant chemotherapy followed by mastectomy and is now on Herceptin Perjeta maintenance.  She notes she is tolerating Herceptin/Perjeta well and denies fatigue, but has improved diarrhea which occurs 2-3 times a week. She note new very mild numbness in the tips of her fingers (mostly in her thumb) which have been very dry lately. Plans to have her last Herceptin/Perjeta on 07/16/17. She has requested to have her last treatment and port removal before her end of year deductible in 06/2018. Her latest ECHO from 04/29/18 was normal. She is interested in Survivorship clinic after treatment.   She is tolerating Tamoxifen well with improved intermittent hot flashes. She had not had a menstrual cycle since 02/2018.  She notes her recent kidney stone has been passed and she saw her urologist last week with a KUB which was clear.  Since her last visit in on 04/02/18 she had a screening mammogram of her right breast on 04/15/18 ordered by Dr. Christophe Louis. Her mammogram was negative for malignancy.  She would like to work more hours at work and requested a letter to clear her.     REVIEW OF SYSTEMS:   Constitutional: Denies fevers, chills or abnormal weight loss (+) intermittent hot flashes Eyes: Denies blurriness  of vision Ears, nose, mouth, throat, and face: Denies mucositis or sore throat Respiratory: Denies cough, dyspnea or wheezes Cardiovascular: Denies palpitation, chest discomfort Gastrointestinal:  Denies nausea, heartburn (+) improved diarrhea to 2-3/week Skin: Denies abnormal skin rashes (+) moderate dryness of hands Lymphatics: Denies new  lymphadenopathy or easy bruising Neurological:Denies tingling or new weaknesses (+) mild numbness in tips of fingers, mostly thumbs Behavioral/Psych: Mood is stable, no new changes  Extremities: No lower extremity edema Breast: denies any pain or lumps or nodules in either breasts All other systems were reviewed with the patient and are negative.  I have reviewed the past medical history, past surgical history, social history and family history with the patient and they are unchanged from previous note.  ALLERGIES:  has No Known Allergies.  MEDICATIONS:  Current Outpatient Medications  Medication Sig Dispense Refill  . acetaminophen (TYLENOL) 325 MG tablet Take 650 mg by mouth daily as needed for headache.    . lovastatin (MEVACOR) 40 MG tablet Take 40 mg by mouth every evening.    . pantoprazole (PROTONIX) 40 MG tablet TAKE ONE (1) TABLET BY MOUTH EVERY DAY 30 tablet 3  . tamoxifen (NOLVADEX) 20 MG tablet Take 1 tablet (20 mg total) by mouth daily. 90 tablet 3   No current facility-administered medications for this visit.     PHYSICAL EXAMINATION: ECOG PERFORMANCE STATUS: 1 - Symptomatic but completely ambulatory  There were no vitals filed for this visit. There were no vitals filed for this visit.  GENERAL:alert, no distress and comfortable SKIN: skin color, texture, turgor are normal, no rashes or significant lesions EYES: normal, Conjunctiva are pink and non-injected, sclera clear OROPHARYNX:no exudate, no erythema and lips, buccal mucosa, and tongue normal  NECK: supple, thyroid normal size, non-tender, without nodularity LYMPH:  no palpable lymphadenopathy in the cervical, axillary or inguinal LUNGS: clear to auscultation and percussion with normal breathing effort HEART: regular rate & rhythm and no murmurs and no lower extremity edema ABDOMEN:abdomen soft, non-tender and normal bowel sounds MUSCULOSKELETAL:no cyanosis of digits and no clubbing  NEURO: alert & oriented x  3 with fluent speech, no focal motor/sensory deficits EXTREMITIES: No lower extremity edema   LABORATORY DATA:  I have reviewed the data as listed CMP Latest Ref Rng & Units 04/02/2018 02/19/2018 01/01/2018  Glucose 70 - 99 mg/dL 127(H) 127(H) 112(H)  BUN 6 - 20 mg/dL '12 12 12  '$ Creatinine 0.44 - 1.00 mg/dL 0.81 0.84 0.82  Sodium 135 - 145 mmol/L 141 141 142  Potassium 3.5 - 5.1 mmol/L 3.3(L) 3.4(L) 3.5  Chloride 98 - 111 mmol/L 104 105 104  CO2 22 - 32 mmol/L '29 25 27  '$ Calcium 8.9 - 10.3 mg/dL 8.8(L) 8.7(L) 9.7  Total Protein 6.5 - 8.1 g/dL 6.3(L) 6.7 6.9  Total Bilirubin 0.3 - 1.2 mg/dL 0.3 0.3 0.4  Alkaline Phos 38 - 126 U/L 93 106 134(H)  AST 15 - 41 U/L 18 18 34  ALT 0 - 44 U/L 15 19 43    Lab Results  Component Value Date   WBC 7.7 04/02/2018   HGB 12.3 04/02/2018   HCT 35.6 04/02/2018   MCV 97.3 04/02/2018   PLT 223 04/02/2018   NEUTROABS 4.9 04/02/2018    ASSESSMENT & PLAN:  No problem-specific Assessment & Plan notes found for this encounter.    No orders of the defined types were placed in this encounter.  The patient has a good understanding of the overall plan. she agrees with it. she  will call with any problems that may develop before the next visit here.  Nicholas Lose, MD 05/14/2018  Oneal Deputy, am acting as scribe for Nicholas Lose, MD.  I have reviewed the above documentation for accuracy and completeness, and I agree with the above.

## 2018-05-14 NOTE — Telephone Encounter (Signed)
Per 11/5 los. Made appt for March SCP visit.  Called patient with date and time and asked her to swing by after her next appt to pick up a new calendar.

## 2018-05-14 NOTE — Patient Instructions (Signed)
[facility] Discharge Instructions for Patients Receiving Chemotherapy  Today you received the following chemotherapy agents: Trastuzumab (Herceptin) and Pertuzumab (Perjeta)  To help prevent nausea and vomiting after your treatment, we encourage you to take your nausea medication as directed.    If you develop nausea and vomiting that is not controlled by your nausea medication, call the clinic.   BELOW ARE SYMPTOMS THAT SHOULD BE REPORTED IMMEDIATELY:  *FEVER GREATER THAN 100.5 F  *CHILLS WITH OR WITHOUT FEVER  NAUSEA AND VOMITING THAT IS NOT CONTROLLED WITH YOUR NAUSEA MEDICATION  *UNUSUAL SHORTNESS OF BREATH  *UNUSUAL BRUISING OR BLEEDING  TENDERNESS IN MOUTH AND THROAT WITH OR WITHOUT PRESENCE OF ULCERS  *URINARY PROBLEMS  *BOWEL PROBLEMS  UNUSUAL RASH Items with * indicate a potential emergency and should be followed up as soon as possible.  Feel free to call the clinic should you have any questions or concerns. The clinic phone number is [phone].  Please show the CHEMO ALERT CARD at check-in to the Emergency Department and triage nurse.   

## 2018-05-14 NOTE — Assessment & Plan Note (Addendum)
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 Adjuvant TCH Perjeta followed by Herceptin and Perjeta maintenance for 1 year 2. followed by adjuvant antiestrogen therapy -------------------------------------------------------------------- Current treatment:Herceptin Perjeta maintenance Tamoxifen started 02/19/2018.  Tamoxifen toxicities: Tolerating extremely well.  Does not have any hot flashes or myalgias.  Anemia: Due to heavy menstrual cycles and kidney stone and chemotherapy, today's hemoglobin is12.1. She received blood transfusionpreviously.  She finishes Herceptin 06/25/2018.  We will set her up for survivorship care plan visit with Mendel Ryder in March 2020.

## 2018-05-23 ENCOUNTER — Ambulatory Visit: Payer: Self-pay | Admitting: General Surgery

## 2018-05-30 ENCOUNTER — Telehealth: Payer: Self-pay | Admitting: Hematology and Oncology

## 2018-05-30 NOTE — Telephone Encounter (Signed)
Printed medical records from 04/09/18 to present for [employer]. Release ID# [id number].  Gave to [facility] Specialist to complete the questioinaire.

## 2018-05-31 NOTE — Progress Notes (Signed)
Disability paperwork and medical records successfully faxed to [employer] at [phone].

## 2018-06-04 ENCOUNTER — Encounter: Payer: Self-pay | Admitting: *Deleted

## 2018-06-04 ENCOUNTER — Inpatient Hospital Stay: Payer: BLUE CROSS/BLUE SHIELD

## 2018-06-04 VITALS — BP 154/95 | HR 80 | Temp 98.3°F | Resp 17

## 2018-06-04 DIAGNOSIS — Z170 Estrogen receptor positive status [ER+]: Principal | ICD-10-CM

## 2018-06-04 DIAGNOSIS — C50412 Malignant neoplasm of upper-outer quadrant of left female breast: Secondary | ICD-10-CM | POA: Diagnosis not present

## 2018-06-04 MED ORDER — SODIUM CHLORIDE 0.9% FLUSH
10.0000 mL | INTRAVENOUS | Status: DC | PRN
  Administered 2018-06-04: 10 mL
  Filled 2018-06-04: qty 10

## 2018-06-04 MED ORDER — ACETAMINOPHEN 325 MG PO TABS
650.0000 mg | ORAL_TABLET | Freq: Once | ORAL | Status: AC
  Administered 2018-06-04: 650 mg via ORAL

## 2018-06-04 MED ORDER — HEPARIN SOD (PORK) LOCK FLUSH 100 UNIT/ML IV SOLN
500.0000 [IU] | Freq: Once | INTRAVENOUS | Status: AC | PRN
  Administered 2018-06-04: 500 [IU]
  Filled 2018-06-04: qty 5

## 2018-06-04 MED ORDER — DIPHENHYDRAMINE HCL 25 MG PO CAPS
25.0000 mg | ORAL_CAPSULE | Freq: Once | ORAL | Status: AC
  Administered 2018-06-04: 25 mg via ORAL

## 2018-06-04 MED ORDER — ACETAMINOPHEN 325 MG PO TABS
ORAL_TABLET | ORAL | Status: AC
  Filled 2018-06-04: qty 2

## 2018-06-04 MED ORDER — SODIUM CHLORIDE 0.9 % IV SOLN
Freq: Once | INTRAVENOUS | Status: AC
  Administered 2018-06-04: 09:00:00 via INTRAVENOUS
  Filled 2018-06-04: qty 250

## 2018-06-04 MED ORDER — TRASTUZUMAB CHEMO 150 MG IV SOLR
6.0000 mg/kg | Freq: Once | INTRAVENOUS | Status: AC
  Administered 2018-06-04: 378 mg via INTRAVENOUS
  Filled 2018-06-04: qty 18

## 2018-06-04 MED ORDER — DIPHENHYDRAMINE HCL 25 MG PO CAPS
ORAL_CAPSULE | ORAL | Status: AC
  Filled 2018-06-04: qty 1

## 2018-06-04 MED ORDER — SODIUM CHLORIDE 0.9 % IV SOLN
420.0000 mg | Freq: Once | INTRAVENOUS | Status: AC
  Administered 2018-06-04: 420 mg via INTRAVENOUS
  Filled 2018-06-04: qty 14

## 2018-06-04 NOTE — Patient Instructions (Signed)
[facility] Discharge Instructions for Patients Receiving Chemotherapy  Today you received the following chemotherapy agents: Herceptin, Perjeta  To help prevent nausea and vomiting after your treatment, we encourage you to take your nausea medication as directed.   If you develop nausea and vomiting that is not controlled by your nausea medication, call the clinic.   BELOW ARE SYMPTOMS THAT SHOULD BE REPORTED IMMEDIATELY:  *FEVER GREATER THAN 100.5 F  *CHILLS WITH OR WITHOUT FEVER  NAUSEA AND VOMITING THAT IS NOT CONTROLLED WITH YOUR NAUSEA MEDICATION  *UNUSUAL SHORTNESS OF BREATH  *UNUSUAL BRUISING OR BLEEDING  TENDERNESS IN MOUTH AND THROAT WITH OR WITHOUT PRESENCE OF ULCERS  *URINARY PROBLEMS  *BOWEL PROBLEMS  UNUSUAL RASH Items with * indicate a potential emergency and should be followed up as soon as possible.  Feel free to call the clinic should you have any questions or concerns. The clinic phone number is [phone].  Please show the CHEMO ALERT CARD at check-in to the Emergency Department and triage nurse.   

## 2018-06-13 NOTE — Progress Notes (Signed)
Patient Care Team: Carol Ada, MD as PCP - General (Family Medicine)  DIAGNOSIS:    ICD-10-CM   1. Malignant neoplasm of upper-outer quadrant of left breast in female, estrogen receptor positive ([place]) C50.412 CBC with Differential ([place])   Z17.0 [place] ([place] only)    SUMMARY OF ONCOLOGIC HISTORY:   Malignant neoplasm of upper-outer quadrant of left breast in female, estrogen receptor positive ([place])   04/16/2017 Initial Diagnosis    Screening mammogram detected calcifications left breast UOQ 9.3 cm, at 2:00: 1.4 cm and at 1:00: 8 mm; biopsy of mass at 1:00: IDC grade 2 with DCIS ER 95%, PR 90%,Ki-67 45%, HER-2 positive ratio 5.18; iopsy of the 2:00 mass and calcifications were DCIS with suspicion for microinvasion, T1 cN0 stage IA clinical stage AJCC 8    05/09/2017 Genetic Testing    The patient had genetic testing due to a personal and family history of breast cancer.  The Common Hereditary Cancer Panel was ordered.  The Hereditary Gene Panel offered by [facility] includes sequencing and/or deletion duplication testing of the following 47 genes: APC, ATM, AXIN2, BARD1, BMPR1A, BRCA1, BRCA2, BRIP1, CDH1, CDKN2A (p14ARF), CDKN2A (p16INK4a), CKD4, CHEK2, CTNNA1, DICER1, EPCAM (Deletion/duplication testing only), GREM1 (promoter region deletion/duplication testing only), KIT, MEN1, MLH1, MSH2, MSH3, MSH6, MUTYH, NBN, NF1, NHTL1, PALB2, PDGFRA, PMS2, POLD1, POLE, PTEN, RAD50, RAD51C, RAD51D, SDHB, SDHC, SDHD, SMAD4, SMARCA4. STK11, TP53, TSC1, TSC2, and VHL.  The following genes were evaluated for sequence changes only: SDHA and HOXB13 c.251G>A variant only.  Results- No pathogenic variants identified.  A Variant of Uncertain Significance was identified in the gene APC c.-30626C>G (Promoter 1B).  The date of this test report is 05/09/2017.     06/20/2017 Surgery    Left mastectomy: Multifocal IDC with DCIS tumor size is 1.1, 1.6 and 2.4 cm, margins negative, 0/5 lymph nodes  negative, ER 95%, PR 90%, HER-2 positive ratio 5.18, Ki-67 45% T2N0 stage Ib pathologic stage    07/24/2017 - 11/09/2017 Chemotherapy    TCH P followed by Herceptin and Perjeta maintenance ( density discontinued after cycle 2 because of profound rash)     CHIEF COMPLIANT: Follow-up of final Herceptin treatment  INTERVAL HISTORY: Alexis Underwood is a 42 y.o. with above-mentioned history of left breast cancer treated with neoadjuvant chemotherapy followed by mastectomy and is now on Herceptin Perjeta maintenance and Tamoxifen. She presents to the clinic today for her final Herceptin treatment. She reports once daily diarrhea despite taking Imodium. She denies neuropathy in her fingers and toes. Her energy levels are normal and she has been eating normal. She reports chemotherapy has affected her memory and she occassionally struggles to find words. She denies any other residual symptoms from previous treatments or current Tamoxifen treatment. She will have her port removed on 06/28/18. She reviewed her medication list with me.   REVIEW OF SYSTEMS:   Constitutional: Denies fevers, chills or abnormal weight loss Eyes: Denies blurriness of vision Ears, nose, mouth, throat, and face: Denies mucositis or sore throat Respiratory: Denies cough, dyspnea or wheezes Cardiovascular: Denies palpitation, chest discomfort Gastrointestinal:  Denies nausea, heartburn (+) once daily diarrhea  Skin: Denies abnormal skin rashes Lymphatics: Denies new lymphadenopathy or easy bruising Neurological: Denies numbness, tingling or new weaknesses  Behavioral/Psych: Mood is stable, no new changes (+) mild memory issues Extremities: No lower extremity edema Breast: denies any pain or lumps or nodules in either breasts All other systems were reviewed with the patient and are negative.  I have reviewed the past medical history, past surgical history, social history and family history with the patient and they are unchanged  from previous note.  ALLERGIES:  has No Known Allergies.  MEDICATIONS:  Current Outpatient Medications  Medication Sig Dispense Refill  . acetaminophen (TYLENOL) 325 MG tablet Take 650 mg by mouth daily as needed for headache.    . lovastatin (MEVACOR) 40 MG tablet Take 40 mg by mouth every evening.    . tamoxifen (NOLVADEX) 20 MG tablet Take 1 tablet (20 mg total) by mouth daily. 90 tablet 3   No current facility-administered medications for this visit.    Facility-Administered Medications Ordered in Other Visits  Medication Dose Route Frequency Provider Last Rate Last Dose  . sodium chloride flush (NS) 0.9 % injection 10 mL  10 mL Intravenous PRN Nicholas Lose, MD   10 mL at 06/25/18 0300    PHYSICAL EXAMINATION: ECOG PERFORMANCE STATUS: 1 - Symptomatic but completely ambulatory  Vitals:   06/25/18 0953  BP: (!) 154/102  Pulse: 79  Resp: 19  Temp: 98.6 F (37 C)  SpO2: 100%   Filed Weights   06/25/18 0953  Weight: 148 lb (67.1 kg)    GENERAL:alert, no distress and comfortable SKIN: skin color, texture, turgor are normal, no rashes or significant lesions EYES: normal, Conjunctiva are pink and non-injected, sclera clear OROPHARYNX:no exudate, no erythema and lips, buccal mucosa, and tongue normal  NECK: supple, thyroid normal size, non-tender, without nodularity LYMPH:  no palpable lymphadenopathy in the cervical, axillary or inguinal LUNGS: clear to auscultation and percussion with normal breathing effort HEART: regular rate & rhythm and no murmurs and no lower extremity edema ABDOMEN:abdomen soft, non-tender and normal bowel sounds MUSCULOSKELETAL:no cyanosis of digits and no clubbing  NEURO: alert & oriented x 3 with fluent speech, no focal motor/sensory deficits EXTREMITIES: No lower extremity edema  LABORATORY DATA:  I have reviewed the data as listed CMP Latest Ref Rng & Units 06/25/2018 05/14/2018 04/02/2018  Glucose 70 - 99 mg/dL 120(H) 180(H) 127(H)  BUN 6 -  20 mg/dL '13 10 12  '$ Creatinine 0.44 - 1.00 mg/dL 0.82 0.96 0.81  Sodium 135 - 145 mmol/L 144 140 141  Potassium 3.5 - 5.1 mmol/L 3.4(L) 3.4(L) 3.3(L)  Chloride 98 - 111 mmol/L 107 105 104  CO2 22 - 32 mmol/L '27 25 29  '$ Calcium 8.9 - 10.3 mg/dL 8.9 8.6(L) 8.8(L)  Total Protein 6.5 - 8.1 g/dL 6.5 6.2(L) 6.3(L)  Total Bilirubin 0.3 - 1.2 mg/dL 0.4 <0.2(L) 0.3  Alkaline Phos 38 - 126 U/L 90 86 93  AST 15 - 41 U/L '16 17 18  '$ ALT 0 - 44 U/L '17 16 15    '$ Lab Results  Component Value Date   WBC 6.5 06/25/2018   HGB 12.6 06/25/2018   HCT 36.7 06/25/2018   MCV 96.1 06/25/2018   PLT 266 06/25/2018   NEUTROABS 3.8 06/25/2018    ASSESSMENT & PLAN:  Malignant neoplasm of upper-outer quadrant of left breast in female, estrogen receptor positive ([place]) 06/20/2017 left mastectomy: 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 Adjuvant TCH Perjeta followed by Herceptin and Perjeta maintenance for 1 year 2. followed by adjuvant antiestrogen therapy -------------------------------------------------------------------- Current treatment:Herceptin Perjeta maintenance Tamoxifenstarted 02/19/2018.  Tamoxifen toxicities:Tolerating extremely well. Does not have any hot flashes or myalgias.  Anemia: Due to heavy menstrual cycles and kidney stone and chemotherapy, today's hemoglobin is12.6. She received blood transfusionpreviously. Mild hypokalemia: Due to the diarrhea potassium 3.4.  I suspect that the diarrhea will stop after the infusions are complete. Diarrhea: Once a day Imodium  She finishes Herceptin and Perjeta today. Port will be removed. Return to clinic in 6 months for follow-up and after that we can see her once a year    Orders Placed This Encounter  Procedures  . CBC with Differential (Cancer Center Only)    Standing  Status:   Future    Standing Expiration Date:   06/26/2019  . CMP ([place] only)    Standing Status:   Future    Standing Expiration Date:   06/26/2019   The patient has a good understanding of the overall plan. she agrees with it. she will call with any problems that may develop before the next visit here.  Nicholas Lose, MD 06/25/2018   I, Cloyde Reams Dorshimer, am acting as scribe for Nicholas Lose, MD.  I have reviewed the above documentation for accuracy and completeness, and I agree with the above.

## 2018-06-14 ENCOUNTER — Other Ambulatory Visit: Payer: Self-pay | Admitting: Hematology and Oncology

## 2018-06-20 ENCOUNTER — Other Ambulatory Visit: Payer: Self-pay

## 2018-06-20 ENCOUNTER — Encounter (HOSPITAL_BASED_OUTPATIENT_CLINIC_OR_DEPARTMENT_OTHER): Payer: Self-pay | Admitting: *Deleted

## 2018-06-21 ENCOUNTER — Ambulatory Visit: Payer: BLUE CROSS/BLUE SHIELD | Admitting: Plastic Surgery

## 2018-06-25 ENCOUNTER — Telehealth: Payer: Self-pay | Admitting: Hematology and Oncology

## 2018-06-25 ENCOUNTER — Encounter: Payer: Self-pay | Admitting: *Deleted

## 2018-06-25 ENCOUNTER — Inpatient Hospital Stay: Payer: BLUE CROSS/BLUE SHIELD | Attending: Hematology and Oncology

## 2018-06-25 ENCOUNTER — Inpatient Hospital Stay: Payer: BLUE CROSS/BLUE SHIELD

## 2018-06-25 ENCOUNTER — Inpatient Hospital Stay (HOSPITAL_BASED_OUTPATIENT_CLINIC_OR_DEPARTMENT_OTHER): Payer: BLUE CROSS/BLUE SHIELD | Admitting: Hematology and Oncology

## 2018-06-25 VITALS — BP 138/88 | HR 82 | Temp 98.2°F | Resp 20

## 2018-06-25 DIAGNOSIS — Z170 Estrogen receptor positive status [ER+]: Secondary | ICD-10-CM

## 2018-06-25 DIAGNOSIS — D500 Iron deficiency anemia secondary to blood loss (chronic): Secondary | ICD-10-CM

## 2018-06-25 DIAGNOSIS — K922 Gastrointestinal hemorrhage, unspecified: Secondary | ICD-10-CM

## 2018-06-25 DIAGNOSIS — E876 Hypokalemia: Secondary | ICD-10-CM

## 2018-06-25 DIAGNOSIS — Z79811 Long term (current) use of aromatase inhibitors: Secondary | ICD-10-CM

## 2018-06-25 DIAGNOSIS — N200 Calculus of kidney: Secondary | ICD-10-CM

## 2018-06-25 DIAGNOSIS — C50412 Malignant neoplasm of upper-outer quadrant of left female breast: Secondary | ICD-10-CM | POA: Insufficient documentation

## 2018-06-25 DIAGNOSIS — Z5112 Encounter for antineoplastic immunotherapy: Secondary | ICD-10-CM | POA: Insufficient documentation

## 2018-06-25 DIAGNOSIS — R197 Diarrhea, unspecified: Secondary | ICD-10-CM

## 2018-06-25 DIAGNOSIS — Z95828 Presence of other vascular implants and grafts: Secondary | ICD-10-CM

## 2018-06-25 LAB — CBC WITH DIFFERENTIAL/PLATELET
ABS IMMATURE GRANULOCYTES: 0.02 10*3/uL (ref 0.00–0.07)
Basophils Absolute: 0 10*3/uL (ref 0.0–0.1)
Basophils Relative: 1 %
Eosinophils Absolute: 0.2 10*3/uL (ref 0.0–0.5)
Eosinophils Relative: 3 %
HCT: 36.7 % (ref 36.0–46.0)
HEMOGLOBIN: 12.6 g/dL (ref 12.0–15.0)
Immature Granulocytes: 0 %
LYMPHS ABS: 1.8 10*3/uL (ref 0.7–4.0)
Lymphocytes Relative: 28 %
MCH: 33 pg (ref 26.0–34.0)
MCHC: 34.3 g/dL (ref 30.0–36.0)
MCV: 96.1 fL (ref 80.0–100.0)
Monocytes Absolute: 0.6 10*3/uL (ref 0.1–1.0)
Monocytes Relative: 9 %
NEUTROS ABS: 3.8 10*3/uL (ref 1.7–7.7)
Neutrophils Relative %: 59 %
Platelets: 266 10*3/uL (ref 150–400)
RBC: 3.82 MIL/uL — ABNORMAL LOW (ref 3.87–5.11)
RDW: 12.2 % (ref 11.5–15.5)
WBC: 6.5 10*3/uL (ref 4.0–10.5)
nRBC: 0 % (ref 0.0–0.2)

## 2018-06-25 LAB — COMPREHENSIVE METABOLIC PANEL
ALBUMIN: 3.6 g/dL (ref 3.5–5.0)
ALT: 17 U/L (ref 0–44)
AST: 16 U/L (ref 15–41)
Alkaline Phosphatase: 90 U/L (ref 38–126)
Anion gap: 10 (ref 5–15)
BUN: 13 mg/dL (ref 6–20)
CO2: 27 mmol/L (ref 22–32)
CREATININE: 0.82 mg/dL (ref 0.44–1.00)
Calcium: 8.9 mg/dL (ref 8.9–10.3)
Chloride: 107 mmol/L (ref 98–111)
GFR calc Af Amer: >60 mL/min (ref >60)
GFR calc non Af Amer: >60 mL/min (ref >60)
GLUCOSE: 120 mg/dL — AB (ref 70–99)
Potassium: 3.4 mmol/L — ABNORMAL LOW (ref 3.5–5.1)
Sodium: 144 mmol/L (ref 135–145)
Total Bilirubin: 0.4 mg/dL (ref 0.3–1.2)
Total Protein: 6.5 g/dL (ref 6.5–8.1)

## 2018-06-25 MED ORDER — HEPARIN SOD (PORK) LOCK FLUSH 100 UNIT/ML IV SOLN
500.0000 [IU] | Freq: Once | INTRAVENOUS | Status: AC | PRN
  Administered 2018-06-25: 500 [IU]
  Filled 2018-06-25: qty 5

## 2018-06-25 MED ORDER — TRASTUZUMAB CHEMO 150 MG IV SOLR
6.0000 mg/kg | Freq: Once | INTRAVENOUS | Status: AC
  Administered 2018-06-25: 378 mg via INTRAVENOUS
  Filled 2018-06-25: qty 18

## 2018-06-25 MED ORDER — SODIUM CHLORIDE 0.9 % IV SOLN
Freq: Once | INTRAVENOUS | Status: AC
  Administered 2018-06-25: 11:00:00 via INTRAVENOUS
  Filled 2018-06-25: qty 250

## 2018-06-25 MED ORDER — SODIUM CHLORIDE 0.9 % IV SOLN
420.0000 mg | Freq: Once | INTRAVENOUS | Status: AC
  Administered 2018-06-25: 420 mg via INTRAVENOUS
  Filled 2018-06-25: qty 14

## 2018-06-25 MED ORDER — ACETAMINOPHEN 325 MG PO TABS
650.0000 mg | ORAL_TABLET | Freq: Once | ORAL | Status: AC
  Administered 2018-06-25: 650 mg via ORAL

## 2018-06-25 MED ORDER — SODIUM CHLORIDE 0.9% FLUSH
10.0000 mL | INTRAVENOUS | Status: DC | PRN
  Administered 2018-06-25: 10 mL via INTRAVENOUS
  Filled 2018-06-25: qty 10

## 2018-06-25 MED ORDER — DIPHENHYDRAMINE HCL 25 MG PO CAPS
25.0000 mg | ORAL_CAPSULE | Freq: Once | ORAL | Status: AC
  Administered 2018-06-25: 25 mg via ORAL

## 2018-06-25 MED ORDER — ACETAMINOPHEN 325 MG PO TABS
ORAL_TABLET | ORAL | Status: AC
  Filled 2018-06-25: qty 2

## 2018-06-25 MED ORDER — DIPHENHYDRAMINE HCL 25 MG PO CAPS
ORAL_CAPSULE | ORAL | Status: AC
  Filled 2018-06-25: qty 1

## 2018-06-25 MED ORDER — SODIUM CHLORIDE 0.9% FLUSH
10.0000 mL | INTRAVENOUS | Status: DC | PRN
  Administered 2018-06-25: 10 mL
  Filled 2018-06-25: qty 10

## 2018-06-25 NOTE — Patient Instructions (Signed)

## 2018-06-25 NOTE — Patient Instructions (Signed)
[facility] Discharge Instructions for Patients Receiving Chemotherapy  Today you received the following chemotherapy agents Trastuzumab (HERCEPTIN) & Pertuzumab (PERJETA).  To help prevent nausea and vomiting after your treatment, we encourage you to take your nausea medication as prescribed.   If you develop nausea and vomiting that is not controlled by your nausea medication, call the clinic.   BELOW ARE SYMPTOMS THAT SHOULD BE REPORTED IMMEDIATELY:  *FEVER GREATER THAN 100.5 F  *CHILLS WITH OR WITHOUT FEVER  NAUSEA AND VOMITING THAT IS NOT CONTROLLED WITH YOUR NAUSEA MEDICATION  *UNUSUAL SHORTNESS OF BREATH  *UNUSUAL BRUISING OR BLEEDING  TENDERNESS IN MOUTH AND THROAT WITH OR WITHOUT PRESENCE OF ULCERS  *URINARY PROBLEMS  *BOWEL PROBLEMS  UNUSUAL RASH Items with * indicate a potential emergency and should be followed up as soon as possible.  Feel free to call the clinic should you have any questions or concerns. The clinic phone number is [phone].  Please show the CHEMO ALERT CARD at check-in to the Emergency Department and triage nurse.   

## 2018-06-25 NOTE — Assessment & Plan Note (Signed)
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 Adjuvant TCH Perjeta followed by Herceptin and Perjeta maintenance for 1 year 2. followed by adjuvant antiestrogen therapy -------------------------------------------------------------------- Current treatment:Herceptin Perjeta maintenance Tamoxifenstarted 02/19/2018.  Tamoxifen toxicities:Tolerating extremely well. Does not have any hot flashes or myalgias.  Anemia: Due to heavy menstrual cycles and kidney stone and chemotherapy, today's hemoglobin is12.1. She received blood transfusionpreviously.  She finishes Herceptin and Perjeta today. Port will be removed. Return to clinic in 6 months for follow-up and after that we can see her once a year

## 2018-06-25 NOTE — Telephone Encounter (Signed)
Gave avs and calendar ° °

## 2018-06-27 NOTE — Progress Notes (Signed)
Ensure pre surgery drink given with instructions to complete by [facility], pt verbalized understanding.

## 2018-06-27 NOTE — Progress Notes (Signed)
FMLA successfully faxed to [employer] at [phone]. Mailed copy to patient address on file.

## 2018-06-28 ENCOUNTER — Ambulatory Visit (HOSPITAL_BASED_OUTPATIENT_CLINIC_OR_DEPARTMENT_OTHER): Payer: BLUE CROSS/BLUE SHIELD | Admitting: Anesthesiology

## 2018-06-28 ENCOUNTER — Encounter (HOSPITAL_BASED_OUTPATIENT_CLINIC_OR_DEPARTMENT_OTHER): Payer: Self-pay | Admitting: Anesthesiology

## 2018-06-28 ENCOUNTER — Ambulatory Visit (HOSPITAL_BASED_OUTPATIENT_CLINIC_OR_DEPARTMENT_OTHER)
Admission: RE | Admit: 2018-06-28 | Discharge: 2018-06-28 | Disposition: A | Discharge status: Home / Self Care | Payer: BLUE CROSS/BLUE SHIELD | Attending: General Surgery | Admitting: General Surgery

## 2018-06-28 ENCOUNTER — Other Ambulatory Visit: Payer: Self-pay

## 2018-06-28 ENCOUNTER — Encounter (HOSPITAL_BASED_OUTPATIENT_CLINIC_OR_DEPARTMENT_OTHER)
Admission: RE | Disposition: A | Discharge status: Home / Self Care | Payer: Self-pay | Source: Home / Self Care | Attending: General Surgery

## 2018-06-28 DIAGNOSIS — Z853 Personal history of malignant neoplasm of breast: Secondary | ICD-10-CM | POA: Insufficient documentation

## 2018-06-28 DIAGNOSIS — Z452 Encounter for adjustment and management of vascular access device: Secondary | ICD-10-CM | POA: Diagnosis not present

## 2018-06-28 DIAGNOSIS — Z9012 Acquired absence of left breast and nipple: Secondary | ICD-10-CM | POA: Insufficient documentation

## 2018-06-28 HISTORY — DX: Adverse effect of unspecified anesthetic, initial encounter: T41.45XA

## 2018-06-28 HISTORY — DX: Other complications of anesthesia, initial encounter: T88.59XA

## 2018-06-28 HISTORY — PX: PORT-A-CATH REMOVAL: SHX5289

## 2018-06-28 SURGERY — REMOVAL PORT-A-CATH
Anesthesia: General | Site: Chest

## 2018-06-28 MED ORDER — ONDANSETRON HCL 4 MG/2ML IJ SOLN: 4.0000 mg | Freq: Once | INTRAMUSCULAR | Status: DC | PRN

## 2018-06-28 MED ORDER — ONDANSETRON HCL 4 MG/2ML IJ SOLN
INTRAMUSCULAR | Status: DC | PRN
  Administered 2018-06-28: 4 mg via INTRAVENOUS

## 2018-06-28 MED ORDER — FENTANYL CITRATE (PF) 100 MCG/2ML IJ SOLN: 25.0000 ug | INTRAMUSCULAR | Status: DC | PRN

## 2018-06-28 MED ORDER — DEXAMETHASONE SODIUM PHOSPHATE 4 MG/ML IJ SOLN
INTRAMUSCULAR | Status: DC | PRN
  Administered 2018-06-28: 5 mg via INTRAVENOUS

## 2018-06-28 MED ORDER — LACTATED RINGERS IV SOLN
INTRAVENOUS | Status: DC
  Administered 2018-06-28: 10:00:00 via INTRAVENOUS

## 2018-06-28 MED ORDER — BUPIVACAINE-EPINEPHRINE 0.25% -1:200000 IJ SOLN
INTRAMUSCULAR | Status: DC | PRN
  Administered 2018-06-28: 10 mL

## 2018-06-28 MED ORDER — FENTANYL CITRATE (PF) 100 MCG/2ML IJ SOLN: 50.0000 ug | INTRAMUSCULAR | Status: DC | PRN

## 2018-06-28 MED ORDER — HYDROCODONE-ACETAMINOPHEN 5-325 MG PO TABS: 1.0000 | ORAL_TABLET | Freq: Four times a day (QID) | ORAL | 0 refills | Status: DC | PRN

## 2018-06-28 MED ORDER — MIDAZOLAM HCL 2 MG/2ML IJ SOLN: 1.0000 mg | INTRAMUSCULAR | Status: DC | PRN

## 2018-06-28 MED ORDER — SCOPOLAMINE 1 MG/3DAYS TD PT72: 1.0000 | MEDICATED_PATCH | Freq: Once | TRANSDERMAL | Status: DC | PRN

## 2018-06-28 MED ORDER — FENTANYL CITRATE (PF) 100 MCG/2ML IJ SOLN
INTRAMUSCULAR | Status: AC
  Filled 2018-06-28: qty 2

## 2018-06-28 MED ORDER — LIDOCAINE HCL (CARDIAC) PF 100 MG/5ML IV SOSY
PREFILLED_SYRINGE | INTRAVENOUS | Status: DC | PRN
  Administered 2018-06-28: 60 mg via INTRAVENOUS

## 2018-06-28 MED ORDER — PROPOFOL 10 MG/ML IV BOLUS
INTRAVENOUS | Status: DC | PRN
  Administered 2018-06-28: 150 mg via INTRAVENOUS

## 2018-06-28 MED ORDER — CHLORHEXIDINE GLUCONATE CLOTH 2 % EX PADS: 6.0000 | MEDICATED_PAD | Freq: Once | CUTANEOUS | Status: DC

## 2018-06-28 MED ORDER — MEPERIDINE HCL 25 MG/ML IJ SOLN: 6.2500 mg | INTRAMUSCULAR | Status: DC | PRN

## 2018-06-28 MED ORDER — HYDROCODONE-ACETAMINOPHEN 7.5-325 MG PO TABS: 1.0000 | ORAL_TABLET | Freq: Once | ORAL | Status: DC | PRN

## 2018-06-28 SURGICAL SUPPLY — 29 items
ADH SKN CLS APL DERMABOND .7 (GAUZE/BANDAGES/DRESSINGS) ×1
BLADE SURG 15 STRL LF DISP TIS (BLADE) ×1 IMPLANT
BLADE SURG 15 STRL SS (BLADE) ×3
CHLORAPREP W/TINT 26ML (MISCELLANEOUS) ×3 IMPLANT
COVER BACK TABLE 60X90IN (DRAPES) ×3 IMPLANT
COVER MAYO STAND STRL (DRAPES) ×3 IMPLANT
COVER WAND RF STERILE (DRAPES) IMPLANT
DECANTER SPIKE VIAL GLASS SM (MISCELLANEOUS) ×1 IMPLANT
DERMABOND ADVANCED (GAUZE/BANDAGES/DRESSINGS) ×2
DERMABOND ADVANCED .7 DNX12 (GAUZE/BANDAGES/DRESSINGS) ×1 IMPLANT
DRAPE LAPAROTOMY 100X72 PEDS (DRAPES) ×3 IMPLANT
DRAPE UTILITY XL STRL (DRAPES) ×3 IMPLANT
ELECT COATED BLADE 2.86 ST (ELECTRODE) ×2 IMPLANT
ELECT REM PT RETURN 9FT ADLT (ELECTROSURGICAL) ×3
ELECTRODE REM PT RTRN 9FT ADLT (ELECTROSURGICAL) IMPLANT
GLOVE BIO SURGEON STRL SZ7.5 (GLOVE) ×3 IMPLANT
GOWN STRL REUS W/ TWL LRG LVL3 (GOWN DISPOSABLE) ×2 IMPLANT
GOWN STRL REUS W/TWL LRG LVL3 (GOWN DISPOSABLE) ×6
NDL HYPO 25X1 1.5 SAFETY (NEEDLE) ×1 IMPLANT
NEEDLE HYPO 25X1 1.5 SAFETY (NEEDLE) ×3 IMPLANT
PACK BASIN DAY SURGERY FS (CUSTOM PROCEDURE TRAY) ×3 IMPLANT
PENCIL BUTTON HOLSTER BLD 10FT (ELECTRODE) ×2 IMPLANT
SLEEVE SCD COMPRESS KNEE MED (MISCELLANEOUS) IMPLANT
SUT MON AB 4-0 PC3 18 (SUTURE) ×3 IMPLANT
SUT VIC AB 3-0 SH 27 (SUTURE) ×3
SUT VIC AB 3-0 SH 27X BRD (SUTURE) ×1 IMPLANT
SYR CONTROL 10ML LL (SYRINGE) ×3 IMPLANT
TOWEL GREEN STERILE FF (TOWEL DISPOSABLE) ×3 IMPLANT
TOWEL OR NON WOVEN STRL DISP B (DISPOSABLE) ×3 IMPLANT

## 2018-06-28 NOTE — H&P (Signed)
Alexis Underwood  Location: [facility] Patient #: [id number] DOB: [birth year] Married / Language: English / Race: White Female   History of Present Illness  The patient is a 42 year old female who presents for a follow-up for Breast cancer. The patient is a 42 year old white female who is about one year status post left mastectomy and sentinel node biopsy for a T2 N0 left breast cancer that was ER and PR positive and HER-2 positive with a Ki-67 45%. She tolerated the surgery well. She had a reconstruction performed by Dr. Marla Roe. She is done with the reconstruction except for the tattooing which will occur soon. She did have a recent mammogram on the right side that showed no evidence of malignancy. She is now taking tamoxifen and seems to be tolerating it well. The hot flashes seem to be wearing off   Medication History  Tamoxifen Citrate ('20MG'$  Tablet, Oral) Active. Pantoprazole Sodium ('40MG'$  Tablet DR, Oral) Active. Benadryl Allergy ('25MG'$  Tablet, Oral) Active. Lovastatin ('40MG'$  Tablet, Oral) Active. Tylenol ('325MG'$  Tablet, Oral) Active. Prochlorperazine Maleate ('10MG'$  Tablet, Oral) Active. Medications Reconciled    Review of Systems  General Not Present- Appetite Loss, Chills, Fatigue, Fever, Night Sweats, Weight Gain and Weight Loss. Skin Present- Dryness. Not Present- Change in Wart/Mole, Hives, Jaundice, New Lesions, Non-Healing Wounds, Rash and Ulcer. HEENT Present- Wears glasses/contact lenses. Not Present- Earache, Hearing Loss, Hoarseness, Nose Bleed, Oral Ulcers, Ringing in the Ears, Seasonal Allergies, Sinus Pain, Sore Throat, Visual Disturbances and Yellow Eyes. Respiratory Not Present- Bloody sputum, Chronic Cough, Difficulty Breathing, Snoring and Wheezing. Breast Present- Breast Mass. Not Present- Breast Pain, Nipple Discharge and Skin Changes. Cardiovascular Not Present- Chest Pain, Difficulty Breathing Lying Down, Leg Cramps, Palpitations, Rapid  Heart Rate, Shortness of Breath and Swelling of Extremities. Gastrointestinal Not Present- Abdominal Pain, Bloating, Bloody Stool, Change in Bowel Habits, Chronic diarrhea, Constipation, Difficulty Swallowing, Excessive gas, Gets full quickly at meals, Hemorrhoids, Indigestion, Nausea, Rectal Pain and Vomiting. Female Genitourinary Not Present- Frequency, Nocturia, Painful Urination, Pelvic Pain and Urgency. Musculoskeletal Not Present- Back Pain, Joint Pain, Joint Stiffness, Muscle Pain, Muscle Weakness and Swelling of Extremities. Neurological Not Present- Decreased Memory, Fainting, Headaches, Numbness, Seizures, Tingling, Tremor, Trouble walking and Weakness. Psychiatric Not Present- Anxiety, Bipolar, Change in Sleep Pattern, Depression, Fearful and Frequent crying. Endocrine Not Present- Cold Intolerance, Excessive Hunger, Hair Changes, Heat Intolerance, Hot flashes and New Diabetes. Hematology Not Present- Blood Thinners, Easy Bruising, Excessive bleeding, Gland problems, HIV and Persistent Infections.  Vitals Weight: 146.38 lb Height: 61in Body Surface Area: 1.65 m Body Mass Index: 27.66 kg/m  Temp.: 99.63F  Pulse: 125 (Regular)  P.OX: 97% (Room air) BP: 158/98 (Sitting, Left Arm, Standard)       Physical Exam  General Mental Status-Alert. General Appearance-Consistent with stated age. Hydration-Well hydrated. Voice-Normal.  Head and Neck Head-normocephalic, atraumatic with no lesions or palpable masses. Trachea-midline. Thyroid Gland Characteristics - normal size and consistency.  Eye Eyeball - Bilateral-Extraocular movements intact. Sclera/Conjunctiva - Bilateral-No scleral icterus.  Chest and Lung Exam Chest and lung exam reveals -quiet, even and easy respiratory effort with no use of accessory muscles and on auscultation, normal breath sounds, no adventitious sounds and normal vocal resonance. Inspection Chest Wall - Normal. Back -  normal.  Breast Note: There is no palpable mass in the left reconstructed breast. There is no palpable mass in the reduced right breast. There is no palpable axillary, supraclavicular, or cervical lymphadenopathy.   Cardiovascular Cardiovascular examination reveals -normal heart sounds, regular  rate and rhythm with no murmurs and normal pedal pulses bilaterally.  Abdomen Inspection Inspection of the abdomen reveals - No Hernias. Skin - Scar - no surgical scars. Palpation/Percussion Palpation and Percussion of the abdomen reveal - Soft, Non Tender, No Rebound tenderness, No Rigidity (guarding) and No hepatosplenomegaly. Auscultation Auscultation of the abdomen reveals - Bowel sounds normal.  Neurologic Neurologic evaluation reveals -alert and oriented x 3 with no impairment of recent or remote memory. Mental Status-Normal.  Musculoskeletal Normal Exam - Left-Upper Extremity Strength Normal and Lower Extremity Strength Normal. Normal Exam - Right-Upper Extremity Strength Normal and Lower Extremity Strength Normal.  Lymphatic Head & Neck  General Head & Neck Lymphatics: Bilateral - Description - Normal. Axillary  General Axillary Region: Bilateral - Description - Normal. Tenderness - Non Tender. Femoral & Inguinal  Generalized Femoral & Inguinal Lymphatics: Bilateral - Description - Normal. Tenderness - Non Tender.    Assessment & Plan  MALIGNANT NEOPLASM OF UPPER-OUTER QUADRANT OF LEFT BREAST IN FEMALE, ESTROGEN RECEPTOR POSITIVE (C50.412) Impression: The patient is about 1 year status post left mastectomy for breast cancer with reconstruction. She continues to do well with no clinical evidence of recurrence. Her recent mammogram showed no evidence of malignancy. At this point she will continue to take tamoxifen. She will continue to do regular self exams. I will plan to see her back in about 6 months for a breast cancer follow-up. She is scheduled for port removal in  about 2 weeks and I will also see her for this. Current Plans Follow up with [place] in the office in 6 MONTHS.   Call us sooner as needed.

## 2018-06-28 NOTE — Transfer of Care (Signed)
Immediate Anesthesia Transfer of Care Note  Patient: [name]  Procedure(s) Performed: REMOVAL PORT-A-CATH ERAS PATHWAY (N/A Chest)  Patient Location: PACU  Anesthesia Type:General  Level of Consciousness: drowsy and patient cooperative  Airway & Oxygen Therapy: Patient Spontanous Breathing and Patient connected to face mask oxygen  Post-op Assessment: Report given to RN and Post -op Vital signs reviewed and stable  Post vital signs: Reviewed and stable  Last Vitals:  Vitals Value Taken Time  BP    Temp    Pulse 103 06/28/2018 11:46 AM  Resp 24 06/28/2018 11:46 AM  SpO2 100 % 06/28/2018 11:46 AM  Vitals shown include unvalidated device data.  Last Pain:  Vitals:   06/28/18 0956  TempSrc: Oral  PainSc: 0-No pain         Complications: No apparent anesthesia complications

## 2018-06-28 NOTE — Interval H&P Note (Signed)
History and Physical Interval Note:  06/28/2018 10:50 AM  Nasrin D Wiltgen  has presented today for surgery, with the diagnosis of H/O breast cancer  The various methods of treatment have been discussed with the patient and family. After consideration of risks, benefits and other options for treatment, the patient has consented to  Procedure(s): [place] (N/A) as a surgical intervention .  The patient's history has been reviewed, patient examined, no change in status, stable for surgery.  I have reviewed the patient's chart and labs.  Questions were answered to the patient's satisfaction.     Autumn Messing III

## 2018-06-28 NOTE — Anesthesia Preprocedure Evaluation (Addendum)
Anesthesia Evaluation  Patient identified by MRN, date of birth, ID band Patient awake    Reviewed: Allergy & Precautions, NPO status , Patient's Chart, lab work & pertinent test results  Airway Mallampati: II  TM Distance: >3 FB Neck ROM: Full    Dental no notable dental hx. (+) Teeth Intact   Pulmonary neg pulmonary ROS,    Pulmonary exam normal breath sounds clear to auscultation       Cardiovascular negative cardio ROS Normal cardiovascular exam Rhythm:Regular Rate:Normal     Neuro/Psych negative neurological ROS  negative psych ROS   GI/Hepatic negative GI ROS, Neg liver ROS,   Endo/Other  Hx/o breast Ca S/P ChemoRx Hyperlipidemia  Renal/GU Hx/o Renal Calculi  negative genitourinary   Musculoskeletal negative musculoskeletal ROS (+)   Abdominal   Peds  Hematology negative hematology ROS (+)   Anesthesia Other Findings   Reproductive/Obstetrics                             Anesthesia Physical Anesthesia Plan  ASA: II  Anesthesia Plan: General   Post-op Pain Management:    Induction: Intravenous  PONV Risk Score and Plan: 4 or greater and Ondansetron, Dexamethasone, Treatment may vary due to age or medical condition, TIVA and Propofol infusion  Airway Management Planned: LMA  Additional Equipment:   Intra-op Plan:   Post-operative Plan: Extubation in OR  Informed Consent: I have reviewed the patients History and Physical, chart, labs and discussed the procedure including the risks, benefits and alternatives for the proposed anesthesia with the patient or authorized representative who has indicated his/her understanding and acceptance.   Dental advisory given  Plan Discussed with: CRNA and Surgeon  Anesthesia Plan Comments:        Anesthesia Quick Evaluation

## 2018-06-28 NOTE — Discharge Instructions (Signed)

## 2018-06-28 NOTE — Anesthesia Procedure Notes (Signed)
Procedure Name: LMA Insertion Date/Time: 06/28/2018 11:14 AM Performed by: Willa Frater, CRNA Pre-anesthesia Checklist: Patient identified, Emergency Drugs available, Suction available and Patient being monitored Patient Re-evaluated:Patient Re-evaluated prior to induction Oxygen Delivery Method: Circle system utilized Preoxygenation: Pre-oxygenation with 100% oxygen Induction Type: IV induction Ventilation: Mask ventilation without difficulty LMA: LMA inserted LMA Size: 4.0 Number of attempts: 1 Airway Equipment and Method: Bite block Placement Confirmation: positive ETCO2 Tube secured with: Tape Dental Injury: Teeth and Oropharynx as per pre-operative assessment

## 2018-06-28 NOTE — Anesthesia Postprocedure Evaluation (Signed)
Anesthesia Post Note  Patient: Alexis Underwood  Procedure(s) Performed: REMOVAL PORT-A-CATH ERAS PATHWAY (N/A Chest)     Patient location during evaluation: PACU Anesthesia Type: General Level of consciousness: awake and alert and oriented Pain management: pain level controlled Vital Signs Assessment: post-procedure vital signs reviewed and stable Respiratory status: spontaneous breathing, nonlabored ventilation and respiratory function stable Cardiovascular status: blood pressure returned to baseline and stable Postop Assessment: no apparent nausea or vomiting Anesthetic complications: no    Last Vitals:  Vitals:   06/28/18 1215 06/28/18 1236  BP: 137/86 (!) 153/90  Pulse: 86 81  Resp: 15 16  Temp:  36.7 C  SpO2: 99% 99%    Last Pain:  Vitals:   06/28/18 1236  TempSrc: Oral  PainSc: 0-No pain                 Shianne Zeiser A.

## 2018-06-28 NOTE — Op Note (Signed)
06/28/2018  11:40 AM  PATIENT:  Alexis Underwood  42 y.o. female  PRE-OPERATIVE DIAGNOSIS:  H/O breast cancer  POST-OPERATIVE DIAGNOSIS:  H/O breast cancer  PROCEDURE:  Procedure(s): REMOVAL PORT-A-CATH ERAS PATHWAY (N/A)  SURGEON:  Surgeon(s) and Role:    * Jovita Kussmaul, MD - Primary  PHYSICIAN ASSISTANT:   ASSISTANTS: none   ANESTHESIA:   local and general  EBL:  Minimal   BLOOD ADMINISTERED:none  DRAINS: none   LOCAL MEDICATIONS USED:  MARCAINE     SPECIMEN:  No Specimen  DISPOSITION OF SPECIMEN:  N/A  COUNTS:  YES  TOURNIQUET:  * No tourniquets in log *  DICTATION: .Dragon Dictation   After informed consent was obtained the patient was brought to the operating room and placed in the supine position on the operating table.  After adequate induction of general anesthesia the patient's right chest wall was prepped with ChloraPrep, allowed to dry, and draped in usual sterile manner.  An appropriate timeout was performed.  The area around the port was infiltrated with quarter percent Marcaine.  A small incision was made through her previous incision with a 15 blade knife.  The incision was carried through the subcutaneous tissue sharply with a 15 blade knife until the capsule surrounding the port was opened.  The 2 anchoring stitches were divided and removed.  This port was then gently pushed out of its pocket and with gentle traction was removed from the patient without difficulty.  Pressure was held for several minutes until the area was completely hemostatic.  The tubing tract was closed with a figure-of-eight 3-0 Vicryl stitch.  The deep layer of the wound was then closed with interrupted 3-0 Vicryl stitches.  The skin was then closed with a running 4-0 Monocryl subcuticular stitch.  Dermabond dressings were applied.  The patient tolerated the procedure well.  At the end of the case all needle sponge and instrument counts were correct.  The patient was then awakened and  taken recovery in stable condition.  PLAN OF CARE: Discharge to home after PACU  PATIENT DISPOSITION:  PACU - hemodynamically stable.   Delay start of Pharmacological VTE agent (>24hrs) due to surgical blood loss or risk of bleeding: not applicable

## 2018-07-01 ENCOUNTER — Telehealth: Payer: Self-pay | Admitting: *Deleted

## 2018-07-01 ENCOUNTER — Encounter (HOSPITAL_BASED_OUTPATIENT_CLINIC_OR_DEPARTMENT_OTHER): Payer: Self-pay | Admitting: General Surgery

## 2018-07-01 NOTE — Telephone Encounter (Signed)
Medical records faxed to [facility]; RID [phone]

## 2018-07-05 ENCOUNTER — Ambulatory Visit: Payer: BLUE CROSS/BLUE SHIELD | Admitting: Plastic Surgery

## 2018-08-02 ENCOUNTER — Ambulatory Visit: Payer: BLUE CROSS/BLUE SHIELD | Admitting: Plastic Surgery

## 2018-09-10 ENCOUNTER — Ambulatory Visit
Admission: RE | Admit: 2018-09-10 | Discharge: 2018-09-10 | Disposition: A | Discharge status: Home / Self Care | Payer: BLUE CROSS/BLUE SHIELD | Source: Ambulatory Visit | Attending: Family Medicine | Admitting: Family Medicine

## 2018-09-10 ENCOUNTER — Other Ambulatory Visit: Payer: Self-pay | Admitting: Family Medicine

## 2018-09-10 DIAGNOSIS — S99911A Unspecified injury of right ankle, initial encounter: Secondary | ICD-10-CM

## 2018-09-18 ENCOUNTER — Telehealth: Payer: Self-pay

## 2018-09-18 NOTE — Telephone Encounter (Signed)
Called and lvm with call back number to address pt question.

## 2018-09-24 ENCOUNTER — Inpatient Hospital Stay: Payer: BLUE CROSS/BLUE SHIELD | Admitting: Adult Health

## 2018-10-25 MED FILL — LOVASTATIN 40 MG TABS: 40 | 30 days supply | Qty: 30 | Fill #0

## 2018-10-25 MED FILL — TAMOXIFEN CITRATE 20 MG TAB: 20 | 30 days supply | Qty: 30 | Fill #0

## 2018-11-22 MED FILL — LOVASTATIN 40 MG TABS: 40 | 30 days supply | Qty: 30 | Fill #1

## 2018-11-22 MED FILL — TAMOXIFEN CITRATE 20 MG TAB: 20 | 30 days supply | Qty: 30 | Fill #1

## 2018-12-20 ENCOUNTER — Telehealth: Payer: Self-pay | Admitting: Hematology and Oncology

## 2018-12-20 NOTE — Telephone Encounter (Signed)
I LEFT PATIENT A MESSAGE REGARDING VIDEO VISIT

## 2018-12-20 NOTE — Assessment & Plan Note (Deleted)
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 Adjuvant TCH Perjeta followed by Herceptin and Perjeta maintenance for 1 year completed 06/25/2018 2. followed by adjuvant antiestrogen therapy -------------------------------------------------------------------- Current treatment:Tamoxifenstarted 02/19/2018.  Tamoxifen toxicities:Tolerating extremely well. Does not have any hot flashes or myalgias.  Breast cancer surveillance: Mammogram 04/15/2018: Benign, breast density category B Return to clinic in 1 year for follow-up

## 2018-12-23 ENCOUNTER — Telehealth: Payer: Self-pay | Admitting: Hematology and Oncology

## 2018-12-23 MED FILL — LOVASTATIN 40 MG TABS: 40 | 30 days supply | Qty: 30 | Fill #2

## 2018-12-23 MED FILL — TAMOXIFEN CITRATE 20 MG TAB: 20 | 30 days supply | Qty: 30 | Fill #2

## 2018-12-23 NOTE — Telephone Encounter (Signed)
Called pt per 6/15 sch message - unable to reach pt . Left message for patient to call beck for reschedule.

## 2018-12-24 ENCOUNTER — Telehealth: Payer: Self-pay | Admitting: Hematology and Oncology

## 2018-12-24 NOTE — Telephone Encounter (Signed)
Left message for patient to verify telephone visit for pre reg °

## 2018-12-25 ENCOUNTER — Inpatient Hospital Stay: Payer: BC Managed Care – PPO | Admitting: Hematology and Oncology

## 2018-12-25 ENCOUNTER — Other Ambulatory Visit: Payer: BLUE CROSS/BLUE SHIELD

## 2019-01-25 ENCOUNTER — Other Ambulatory Visit: Payer: Self-pay | Admitting: Obstetrics and Gynecology

## 2019-01-25 DIAGNOSIS — Z1231 Encounter for screening mammogram for malignant neoplasm of breast: Secondary | ICD-10-CM

## 2019-02-25 NOTE — Assessment & Plan Note (Deleted)
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 Adjuvant TCH Perjeta followed by Herceptin and Perjeta maintenance for 1 year 2. followed by adjuvant antiestrogen therapy -------------------------------------------------------------------- Current treatment:Herceptin Perjeta maintenance Tamoxifenstarted 02/19/2018.  Tamoxifen toxicities:Tolerating extremely well. Does not have any hot flashes or myalgias. Treatment-related toxicities like anemia, diarrhea, hypokalemia have resolved.  Breast cancer surveillance: Right breast mammogram scheduled for 04/28/2019  Return to clinic in 1 year for follow-up

## 2019-02-28 ENCOUNTER — Other Ambulatory Visit: Payer: Self-pay | Admitting: Hematology and Oncology

## 2019-02-28 DIAGNOSIS — Z170 Estrogen receptor positive status [ER+]: Secondary | ICD-10-CM

## 2019-02-28 DIAGNOSIS — C50412 Malignant neoplasm of upper-outer quadrant of left female breast: Secondary | ICD-10-CM

## 2019-02-28 NOTE — Telephone Encounter (Signed)
Scheduled F/U 03-04-2019.

## 2019-03-03 ENCOUNTER — Telehealth: Payer: Self-pay | Admitting: *Deleted

## 2019-03-03 NOTE — Telephone Encounter (Signed)
Received message from patient that she would like to reschedule her appointment to late September.  Confirmed new appointment for 9/22 at 8:15am.  Patient aware.  She is requesting in person visit.

## 2019-03-04 ENCOUNTER — Telehealth: Payer: BC Managed Care – PPO | Admitting: Hematology and Oncology

## 2019-03-31 NOTE — Progress Notes (Signed)
Patient Care Team: Carol Ada, MD as PCP - General (Family Medicine) Jovita Kussmaul, MD as Consulting Physician (General Surgery) Nicholas Lose, MD as Consulting Physician (Hematology and Oncology) Bensimhon, Shaune Pascal, MD as Consulting Physician (Cardiology)  DIAGNOSIS:    ICD-10-CM   1. Malignant neoplasm of upper-outer quadrant of left breast in female, estrogen receptor positive ([name])  C50.412 tamoxifen (NOLVADEX) 20 MG tablet   Z17.0     SUMMARY OF ONCOLOGIC HISTORY: Oncology History  Malignant neoplasm of upper-outer quadrant of left breast in female, estrogen receptor positive ([name])  04/16/2017 Initial Diagnosis   Screening mammogram detected calcifications left breast UOQ 9.3 cm, at 2:00: 1.4 cm and at 1:00: 8 mm; biopsy of mass at 1:00: IDC grade 2 with DCIS ER 95%, PR 90%,Ki-67 45%, HER-2 positive ratio 5.18; iopsy of the 2:00 mass and calcifications were DCIS with suspicion for microinvasion, T1 cN0 stage IA clinical stage AJCC 8   05/09/2017 Genetic Testing   The patient had genetic testing due to a personal and family history of breast cancer.  The Common Hereditary Cancer Panel was ordered.  The Hereditary Gene Panel offered by [facility] includes sequencing and/or deletion duplication testing of the following 47 genes: APC, ATM, AXIN2, BARD1, BMPR1A, BRCA1, BRCA2, BRIP1, CDH1, CDKN2A (p14ARF), CDKN2A (p16INK4a), CKD4, CHEK2, CTNNA1, DICER1, EPCAM (Deletion/duplication testing only), GREM1 (promoter region deletion/duplication testing only), KIT, MEN1, MLH1, MSH2, MSH3, MSH6, MUTYH, NBN, NF1, NHTL1, PALB2, PDGFRA, PMS2, POLD1, POLE, PTEN, RAD50, RAD51C, RAD51D, SDHB, SDHC, SDHD, SMAD4, SMARCA4. STK11, TP53, TSC1, TSC2, and VHL.  The following genes were evaluated for sequence changes only: SDHA and HOXB13 c.251G>A variant only.  Results- No pathogenic variants identified.  A Variant of Uncertain Significance was identified in the gene APC c.-[id number]>G (Promoter 1B).  The date of  this test report is 05/09/2017.    06/20/2017 Surgery   Left mastectomy: Multifocal IDC with DCIS tumor size is 1.1, 1.6 and 2.4 cm, margins negative, 0/5 lymph nodes negative, ER 95%, PR 90%, HER-2 positive ratio 5.18, Ki-67 45% T2N0 stage Ib pathologic stage   07/24/2017 - 11/09/2017 Chemotherapy   TCH P followed by Herceptin and Perjeta maintenance (taxotere discontinued after cycle 1 because of profound rash)   02/19/2018 -  Anti-estrogen oral therapy   Tamoxifen 20 mg daily     CHIEF COMPLIANT: Follow-up of left breast cancer on tamoxifen  INTERVAL HISTORY: Alexis Underwood is a 43 y.o. with above-mentioned history of left breast cancer treated with neoadjuvant chemotherapy, mastectomy, adjuvant Herceptin Perjeta maintenance, and who is currently on anti-estrogen therapy with tamoxifen. She presents to the clinic today for follow-up.   REVIEW OF SYSTEMS:   Constitutional: Denies fevers, chills or abnormal weight loss Eyes: Denies blurriness of vision Ears, nose, mouth, throat, and face: Denies mucositis or sore throat Respiratory: Denies cough, dyspnea or wheezes Cardiovascular: Denies palpitation, chest discomfort Gastrointestinal: Denies nausea, heartburn or change in bowel habits Skin: Denies abnormal skin rashes Lymphatics: Denies new lymphadenopathy or easy bruising Neurological: Denies numbness, tingling or new weaknesses Behavioral/Psych: Mood is stable, no new changes  Extremities: No lower extremity edema Breast: denies any pain or lumps or nodules in either breasts All other systems were reviewed with the patient and are negative.  I have reviewed the past medical history, past surgical history, social history and family history with the patient and they are unchanged from previous note.  ALLERGIES:  has No Known Allergies.  MEDICATIONS:  Current Outpatient Medications  Medication Sig Dispense Refill  .  acetaminophen (TYLENOL) 325 MG tablet Take 650 mg by mouth daily  as needed for headache.    . lovastatin (MEVACOR) 40 MG tablet Take 40 mg by mouth every evening.    . tamoxifen (NOLVADEX) 20 MG tablet Take 1 tablet (20 mg total) by mouth daily. 90 tablet 3   No current facility-administered medications for this visit.     PHYSICAL EXAMINATION: ECOG PERFORMANCE STATUS: 1 - Symptomatic but completely ambulatory  Vitals:   04/01/19 0821  BP: (!) 154/102  Pulse: 76  Resp: 17  Temp: 98 F (36.7 C)  SpO2: 100%   Filed Weights   04/01/19 0821  Weight: 147 lb 14.4 oz (67.1 kg)    GENERAL: alert, no distress and comfortable SKIN: skin color, texture, turgor are normal, no rashes or significant lesions EYES: normal, Conjunctiva are pink and non-injected, sclera clear OROPHARYNX: no exudate, no erythema and lips, buccal mucosa, and tongue normal  NECK: supple, thyroid normal size, non-tender, without nodularity LYMPH: no palpable lymphadenopathy in the cervical, axillary or inguinal LUNGS: clear to auscultation and percussion with normal breathing effort HEART: regular rate & rhythm and no murmurs and no lower extremity edema ABDOMEN: abdomen soft, non-tender and normal bowel sounds MUSCULOSKELETAL: no cyanosis of digits and no clubbing  NEURO: alert & oriented x 3 with fluent speech, no focal motor/sensory deficits EXTREMITIES: No lower extremity edema  LABORATORY DATA:  I have reviewed the data as listed CMP Latest Ref Rng & Units 06/25/2018 05/14/2018 04/02/2018  Glucose 70 - 99 mg/dL 120(H) 180(H) 127(H)  BUN 6 - 20 mg/dL _0 Creatinine 0.44 - 1.00 mg/dL 0.82 0.96 0.81  Sodium 135 - 145 mmol/L 144 140 141  Potassium 3.5 - 5.1 mmol/L 3.4(L) 3.4(L) 3.3(L)  Chloride 98 - 111 mmol/L 107 105 104  CO2 22 - 32 mmol/L _1 Calcium 8.9 - 10.3 mg/dL 8.9 8.6(L) 8.8(L)  Total Protein 6.5 - 8.1 g/dL 6.5 6.2(L) 6.3(L)  Total Bilirubin 0.3 - 1.2 mg/dL 0.4 <0.2(L) 0.3  Alkaline Phos 38 - 126 U/L 90 86 93  AST 15 - 41 U/L _2 ALT 0 - 44  U/L _3 Lab Results  Component Value Date   WBC 6.5 06/25/2018   HGB 12.6 06/25/2018   HCT 36.7 06/25/2018   MCV 96.1 06/25/2018   PLT 266 06/25/2018   NEUTROABS 3.8 06/25/2018    ASSESSMENT & PLAN:  Malignant neoplasm of upper-outer quadrant of left breast in female, estrogen receptor positive ([place]) 06/20/2017 left mastectomy: 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 Adjuvant TCH Perjeta followed by Herceptin and Perjeta maintenance for 1 year completed 06/26/2019 2. followed by adjuvant antiestrogen therapy -------------------------------------------------------------------- Current treatment:Tamoxifenstarted 02/19/2018.  Tamoxifen toxicities: 1.  Leg cramps which get better with drinking tonic water 2.  Moderate hot flashes Renewed her prescription for tamoxifen for another year.  Heartburn with occasional difficulty with swallowing: I instructed her to stop take Protonix once again.  If her symptoms persist then we will need to get gastroenterology evaluation.  I instructed her to exercise 30 minutes every day to improve the heartburn symptoms.  Hypertension: Patient will discuss with her primary care physician about the need for blood pressure medication.  Breast cancer surveillance:  1. Right breast mammogram 04/15/2018: Benign,  breast density category B 2.  Breast exam 04/01/2019: Benign  Return to clinic in 1 year for follow-up    No orders of the defined types were placed in this encounter.  The patient has a good understanding of the overall plan. she agrees with it. she will call with any problems that may develop before the next visit here.  Nicholas Lose, MD 04/01/2019  Julious Oka Dorshimer am acting as scribe for Dr. Nicholas Lose.  I have reviewed the above documentation for  accuracy and completeness, and I agree with the above.

## 2019-04-01 ENCOUNTER — Inpatient Hospital Stay: Payer: Self-pay | Attending: Hematology and Oncology | Admitting: Hematology and Oncology

## 2019-04-01 ENCOUNTER — Telehealth: Payer: Self-pay | Admitting: Hematology and Oncology

## 2019-04-01 ENCOUNTER — Other Ambulatory Visit: Payer: Self-pay

## 2019-04-01 DIAGNOSIS — C50412 Malignant neoplasm of upper-outer quadrant of left female breast: Secondary | ICD-10-CM | POA: Insufficient documentation

## 2019-04-01 DIAGNOSIS — Z170 Estrogen receptor positive status [ER+]: Secondary | ICD-10-CM | POA: Insufficient documentation

## 2019-04-01 DIAGNOSIS — Z79810 Long term (current) use of selective estrogen receptor modulators (SERMs): Secondary | ICD-10-CM | POA: Insufficient documentation

## 2019-04-01 DIAGNOSIS — Z79899 Other long term (current) drug therapy: Secondary | ICD-10-CM | POA: Insufficient documentation

## 2019-04-01 MED ORDER — TAMOXIFEN CITRATE 20 MG PO TABS: 20.0000 mg | ORAL_TABLET | Freq: Every day | ORAL | 3 refills | Status: DC

## 2019-04-01 NOTE — Telephone Encounter (Signed)
I talk with patient regarding schedule  

## 2019-04-01 NOTE — Assessment & Plan Note (Signed)
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 Adjuvant TCH Perjeta followed by Herceptin and Perjeta maintenance for 1 year completed 06/26/2019 2. followed by adjuvant antiestrogen therapy -------------------------------------------------------------------- Current treatment:Tamoxifenstarted 02/19/2018.  Tamoxifen toxicities:Tolerating extremely well. Does not have any hot flashes or myalgias. Renewed her prescription for tamoxifen for another year.  Breast cancer surveillance:  1. Right breast mammogram 04/15/2018: Benign, breast density category B 2.  Breast exam 04/01/2019: Benign  Return to clinic in 1 year for follow-up

## 2019-04-08 ENCOUNTER — Telehealth: Payer: Self-pay

## 2019-04-08 NOTE — Telephone Encounter (Signed)
RN spoke with patient.  Patient reports swelling in bilateral hands X 1 month.  Pt denies any changes in diet.  Pt able to still wear rings, however notices they fit tighter.   RN encouraged use of compression sleeves, and increased in fluids with monitoring salt intake.  RN educate patient to elevate both arms throughout the day and evening with pillows.  Pt voiced understanding and agreement.  Pt will call back to update on effectiveness within the next few days.

## 2019-04-28 ENCOUNTER — Other Ambulatory Visit: Payer: Self-pay

## 2019-04-28 ENCOUNTER — Ambulatory Visit
Admission: RE | Admit: 2019-04-28 | Discharge: 2019-04-28 | Disposition: A | Discharge status: Home / Self Care | Payer: BC Managed Care – PPO | Source: Ambulatory Visit | Attending: Obstetrics and Gynecology | Admitting: Obstetrics and Gynecology

## 2019-04-28 DIAGNOSIS — Z1231 Encounter for screening mammogram for malignant neoplasm of breast: Secondary | ICD-10-CM

## 2019-04-28 HISTORY — DX: Personal history of antineoplastic chemotherapy: Z92.21

## 2019-10-11 IMAGING — MG MM CLIP PLACEMENT
2 series · 2 of 2 positions shown · non-contrast
Comparison: Previous exam(s).

CLINICAL DATA: 41-year-old patient underwent MRI guided right
breast biopsy today of an approximately 5 mm enhancing nodule in the
upper central right breast, anterior depth. She has a recent
diagnosis of contralateral left breast cancer.

EXAM:
DIAGNOSTIC RIGHT MAMMOGRAM POST MRI BIOPSY

[R ML]
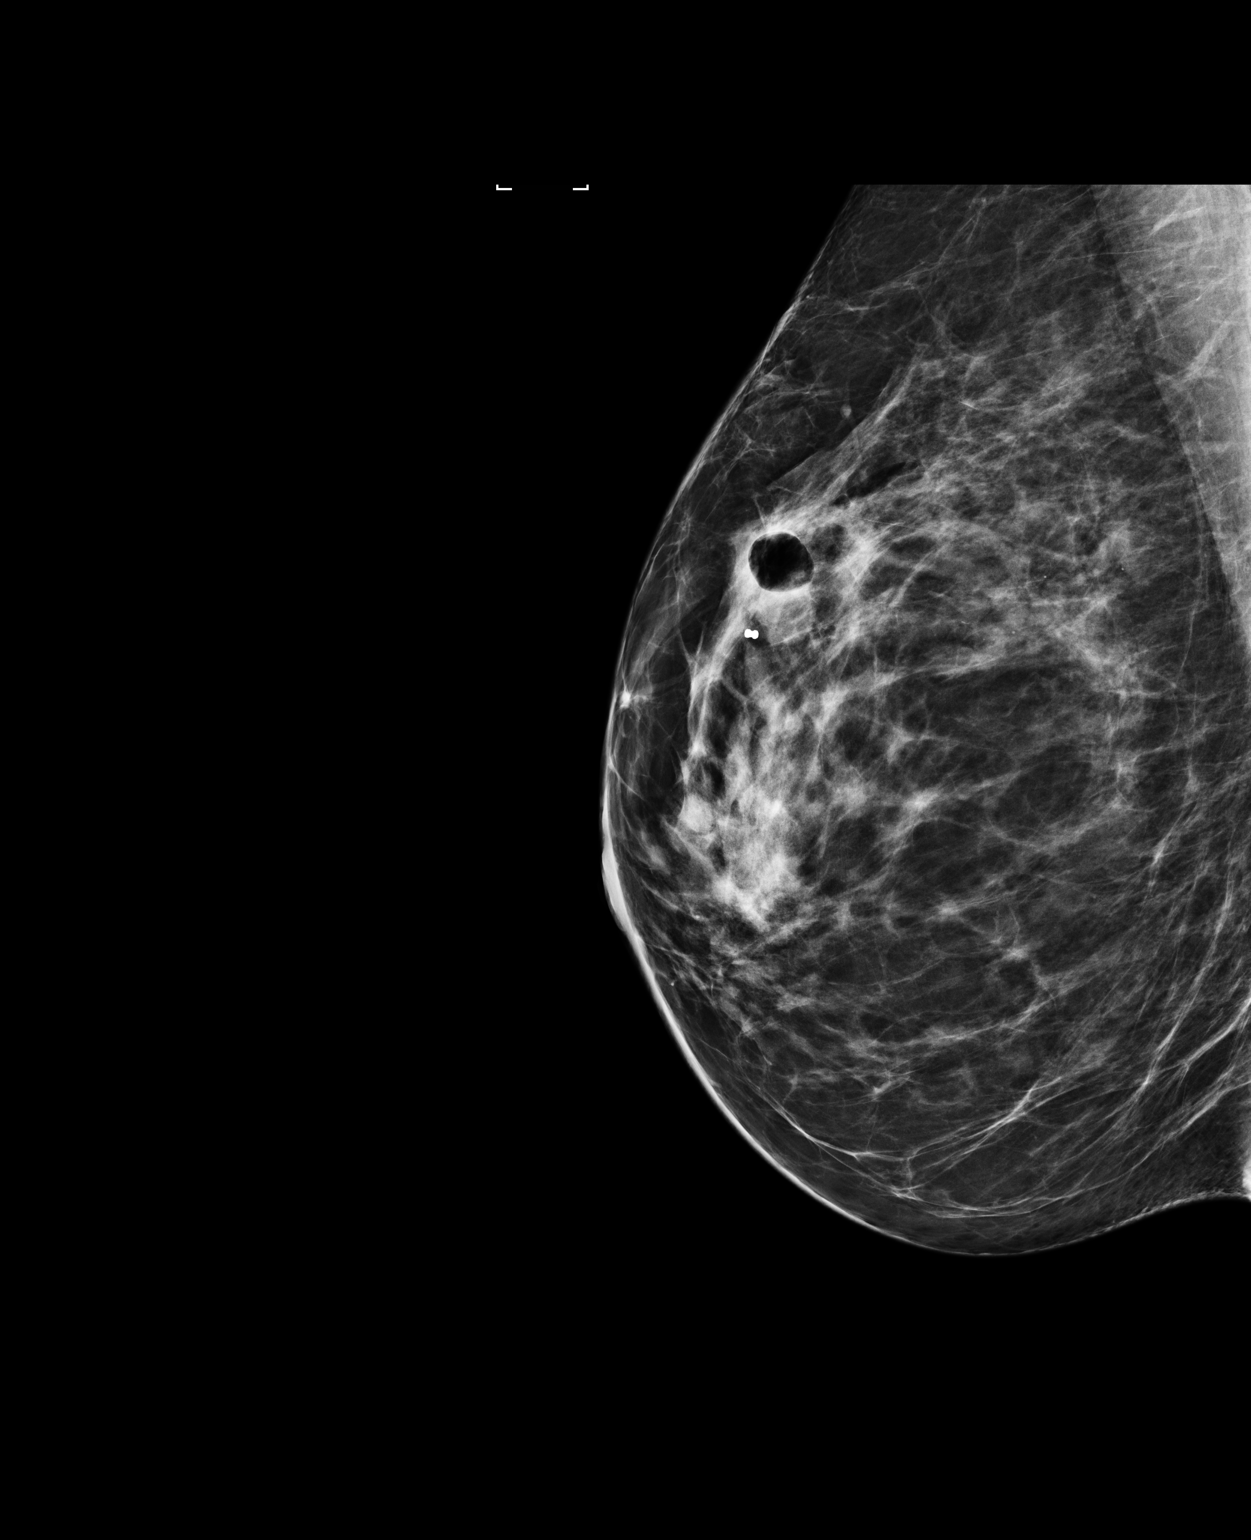

[R CC]
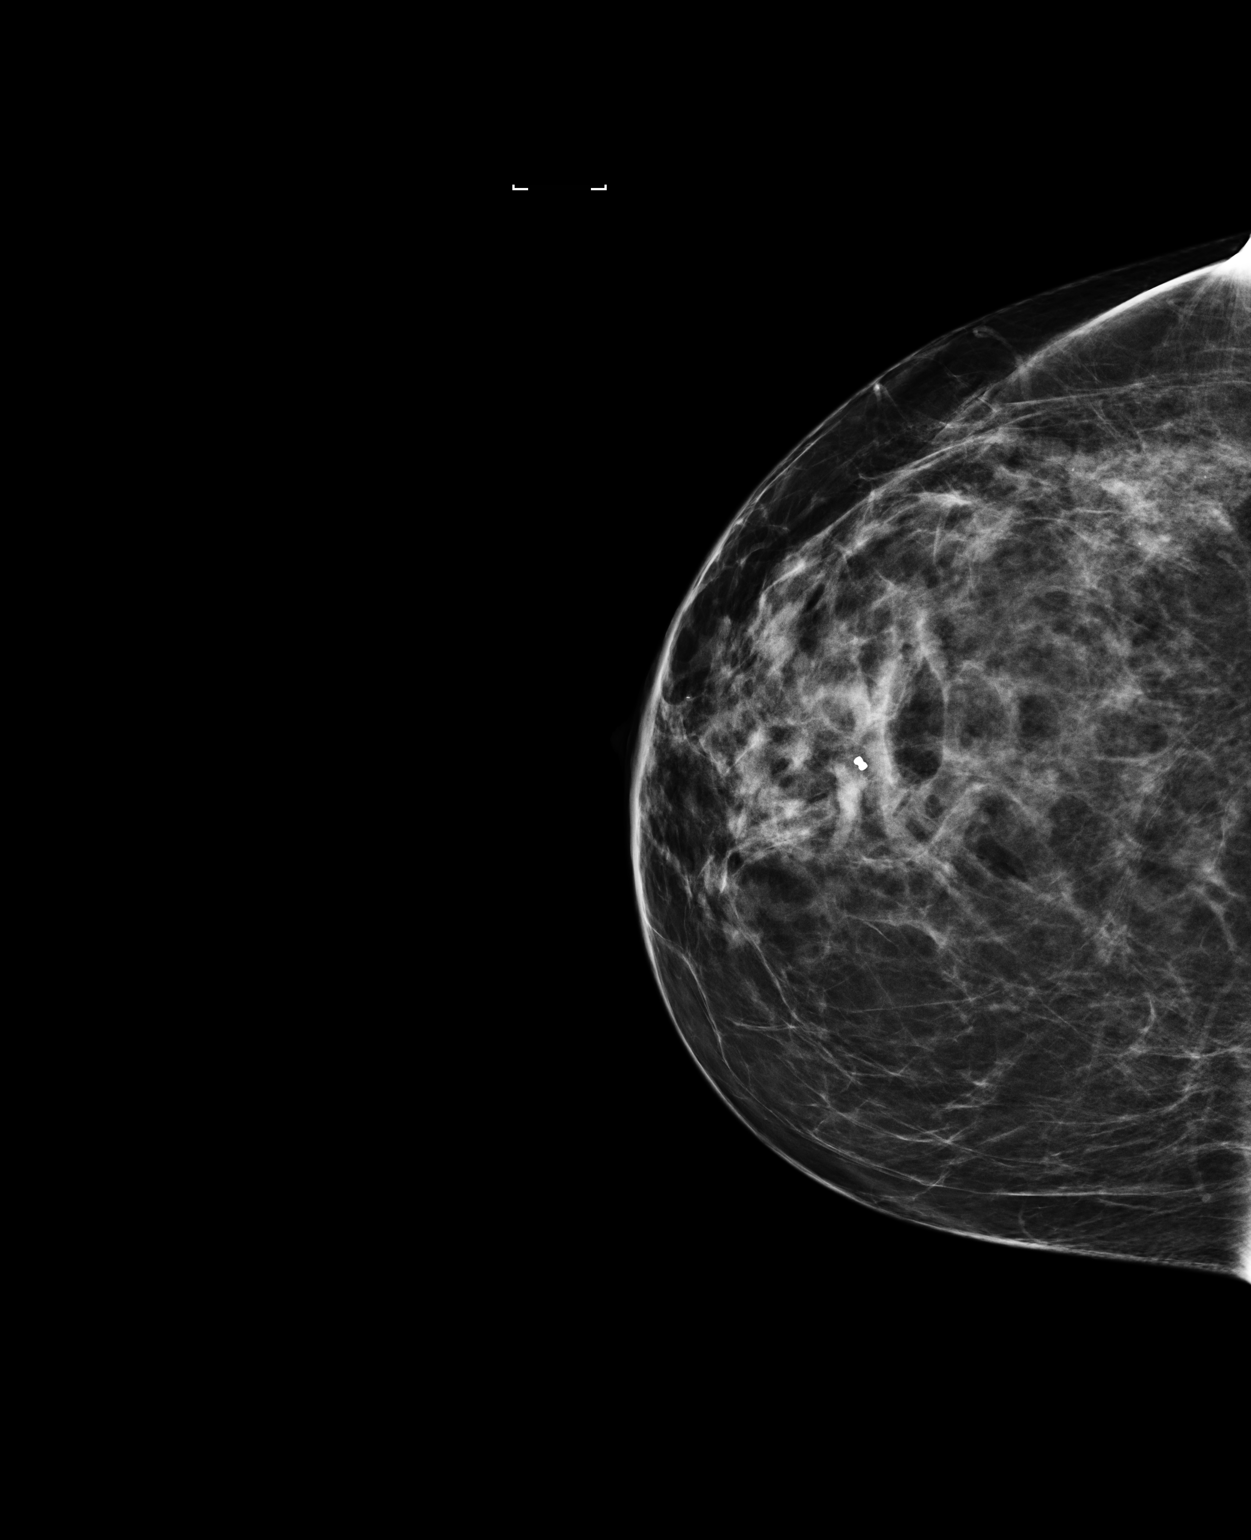

[2 of 2 positions shown; findings below may reference images not displayed]

FINDINGS: Mammographic images were obtained following MRI guided biopsy of an
approximately 5 mm nodular area of enhancement in the upper central
right breast, anterior third. A dumbbell-shaped biopsy clip is
satisfactorily positioned in the expected location of the biopsy.
IMPRESSION: Satisfactory position dumbbell-shaped biopsy clip.

Final Assessment: Post Procedure Mammograms for Marker Placement

## 2019-11-25 ENCOUNTER — Other Ambulatory Visit: Payer: Self-pay | Admitting: Obstetrics and Gynecology

## 2019-11-25 ENCOUNTER — Ambulatory Visit
Admission: RE | Admit: 2019-11-25 | Discharge: 2019-11-25 | Disposition: A | Discharge status: Home / Self Care | Payer: No Typology Code available for payment source | Source: Ambulatory Visit | Attending: Obstetrics and Gynecology | Admitting: Obstetrics and Gynecology

## 2019-11-25 ENCOUNTER — Other Ambulatory Visit: Payer: Self-pay

## 2019-11-25 DIAGNOSIS — Z1231 Encounter for screening mammogram for malignant neoplasm of breast: Secondary | ICD-10-CM

## 2019-12-11 ENCOUNTER — Other Ambulatory Visit: Payer: Self-pay | Admitting: *Deleted

## 2019-12-11 ENCOUNTER — Ambulatory Visit
Admission: RE | Admit: 2019-12-11 | Discharge: 2019-12-11 | Disposition: A | Discharge status: Home / Self Care | Payer: No Typology Code available for payment source | Source: Ambulatory Visit | Attending: Hematology and Oncology | Admitting: Hematology and Oncology

## 2019-12-11 DIAGNOSIS — C50412 Malignant neoplasm of upper-outer quadrant of left female breast: Secondary | ICD-10-CM

## 2019-12-11 NOTE — Progress Notes (Signed)
Pt called with complaint of bilateral lower leg cramping, worse in the left leg.  Pt also states she is experiencing pain behind her left knee.  Pt denies recent injury, trauma, redness, or swelling.  Per MD pt currently on tamoxifen and will need to r/o DVT with vas [place].  Order placed and apt scheduled.  Pt verbalized understanding of apt date and time.

## 2019-12-15 ENCOUNTER — Ambulatory Visit (HOSPITAL_COMMUNITY): Payer: No Typology Code available for payment source

## 2019-12-18 ENCOUNTER — Other Ambulatory Visit: Payer: Self-pay | Admitting: Orthopedic Surgery

## 2019-12-18 DIAGNOSIS — M25562 Pain in left knee: Secondary | ICD-10-CM

## 2019-12-19 ENCOUNTER — Ambulatory Visit
Admission: RE | Admit: 2019-12-19 | Discharge: 2019-12-19 | Disposition: A | Discharge status: Home / Self Care | Payer: No Typology Code available for payment source | Source: Ambulatory Visit | Attending: Orthopedic Surgery | Admitting: Orthopedic Surgery

## 2019-12-19 ENCOUNTER — Other Ambulatory Visit: Payer: Self-pay

## 2019-12-19 DIAGNOSIS — M25562 Pain in left knee: Secondary | ICD-10-CM

## 2019-12-23 ENCOUNTER — Other Ambulatory Visit: Payer: No Typology Code available for payment source

## 2020-01-07 ENCOUNTER — Other Ambulatory Visit (HOSPITAL_COMMUNITY): Payer: Self-pay | Admitting: Family Medicine

## 2020-02-08 IMAGING — CT CT RENAL STONE PROTOCOL
2 of 4 series · 16 of 46 positions shown, 18 images · non-contrast
Comparison: 01/03/2005 CT abdomen/pelvis.

CLINICAL DATA: Gross hematuria. Right flank pain for 2 weeks.
History of nephrolithiasis and breast cancer.

EXAM:
CT ABDOMEN AND PELVIS WITHOUT CONTRAST
TECHNIQUE: Multidetector CT imaging of the abdomen and pelvis was performed
following the standard protocol without IV contrast.

[Series 2: axial st · axial · 0.79mm/px · z∈[-748,-358]mm · 13 of 88 slices shown, 15 images]
[im 5/88  soft-tissue]
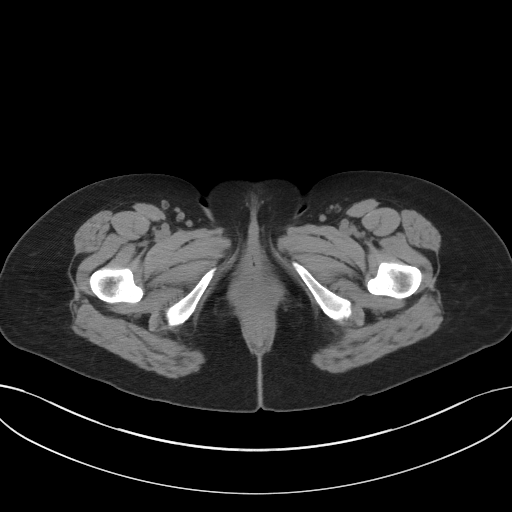
[im 5/88  bone]
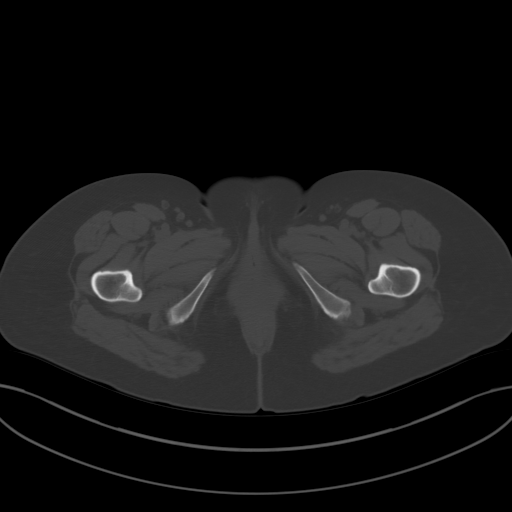
[im 14/88  soft-tissue]
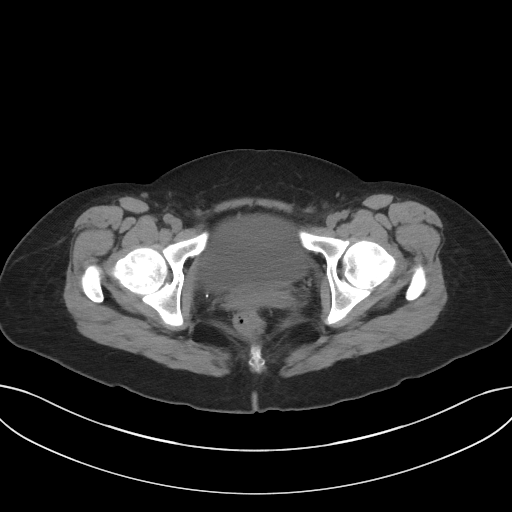
[im 19/88  soft-tissue]
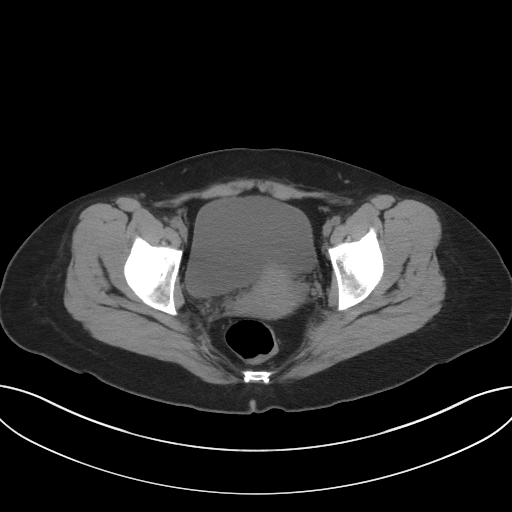
[im 23/88  soft-tissue]
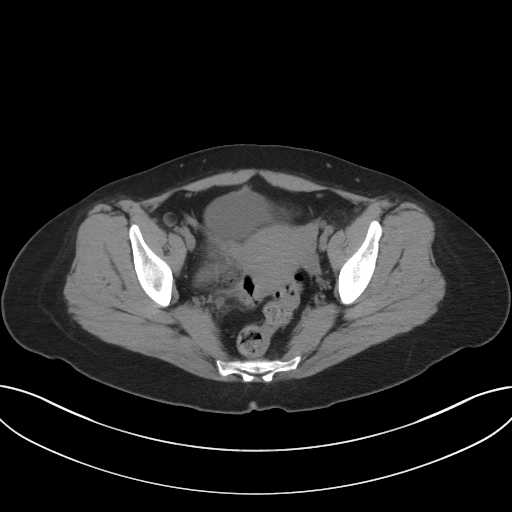
[im 33/88  soft-tissue]
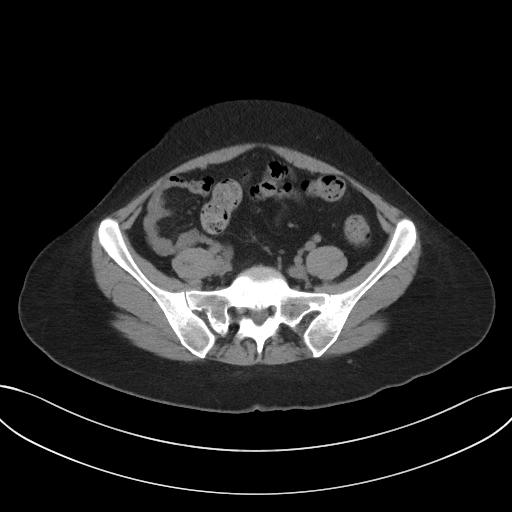
[im 37/88  soft-tissue]
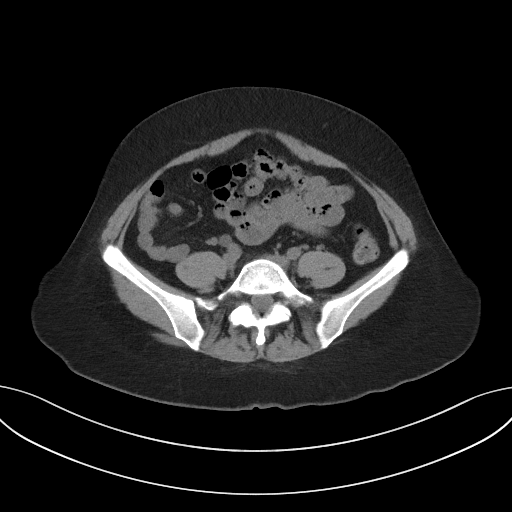
[im 46/88  soft-tissue]
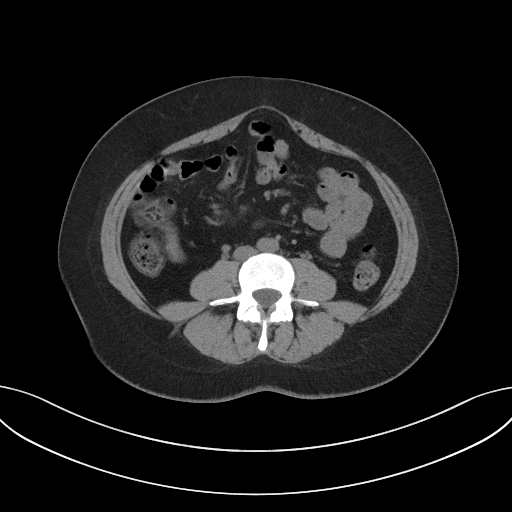
[im 51/88  soft-tissue]
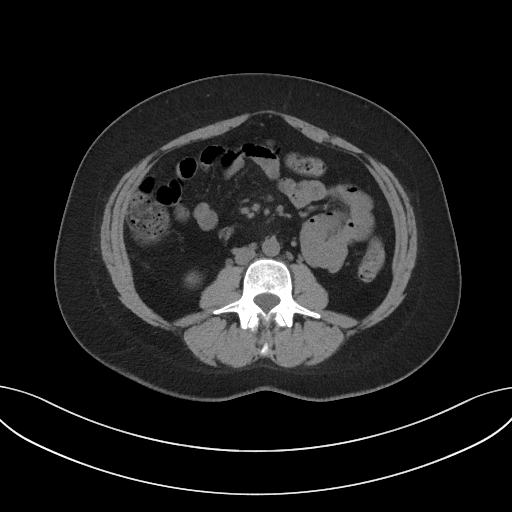
[im 55/88  soft-tissue]
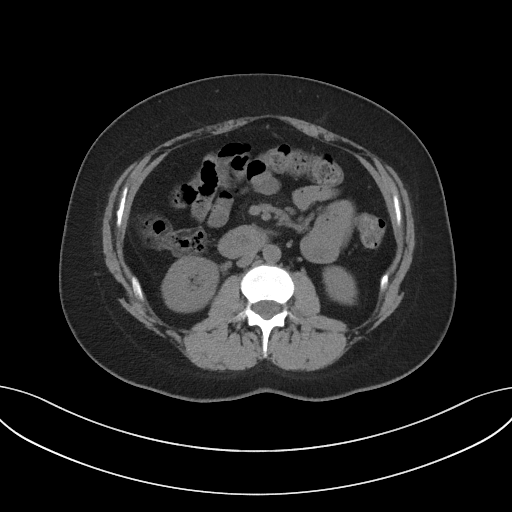
[im 55/88  bone]
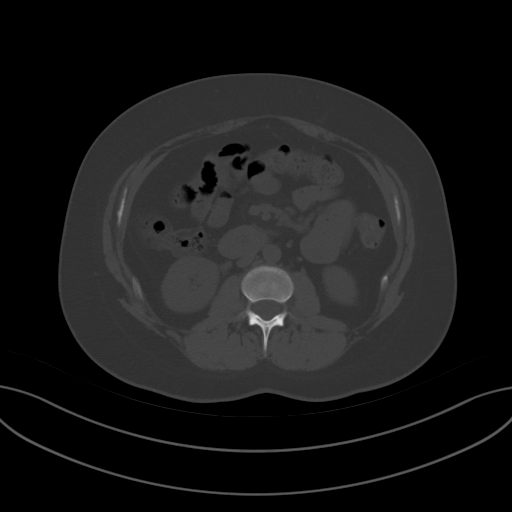
[im 65/88  soft-tissue]
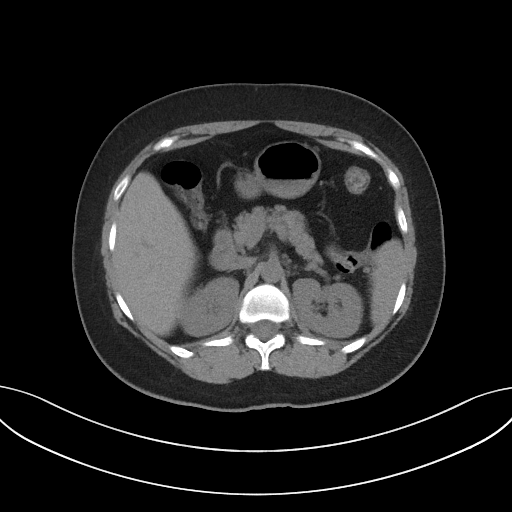
[im 69/88  soft-tissue]
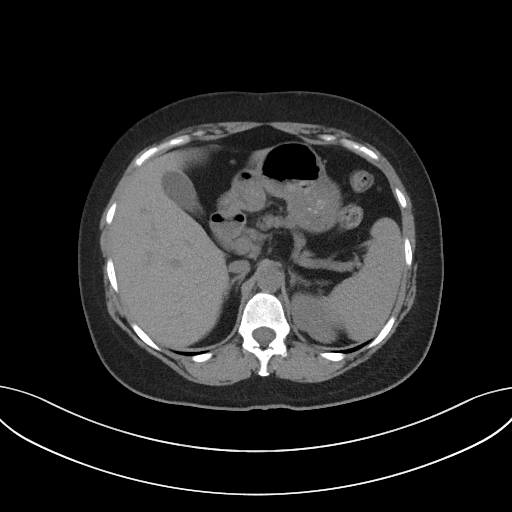
[im 74/88  soft-tissue]
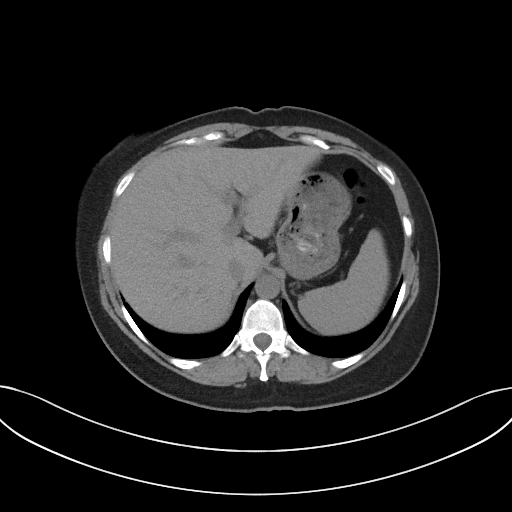
[im 83/88  soft-tissue]
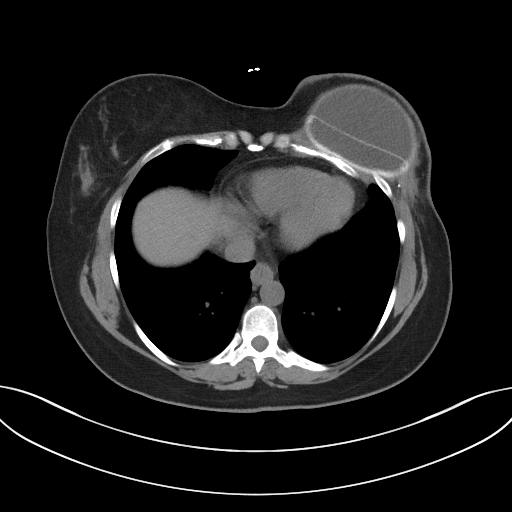

[Series 5: coronal · coronal · 0.74mm/px · 3 of 140 slices shown]
[im 47/140  soft-tissue]
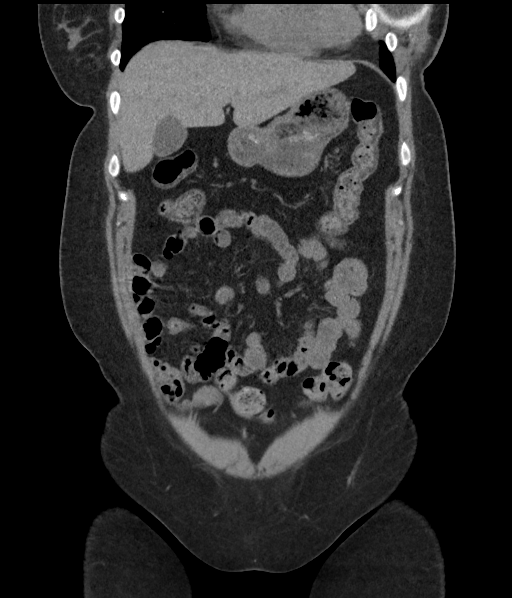
[im 62/140  soft-tissue]
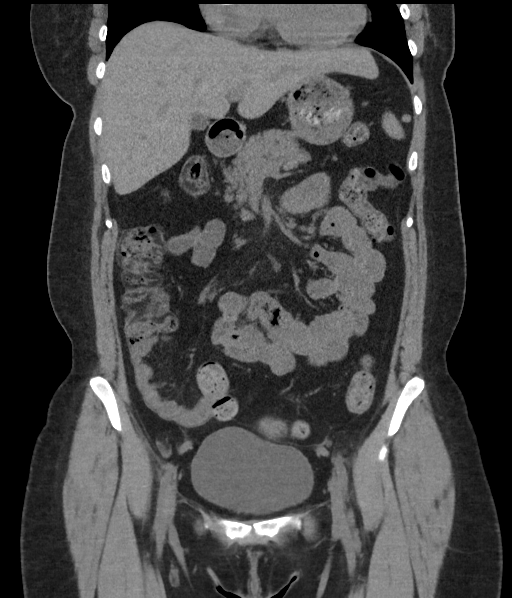
[im 78/140  soft-tissue]
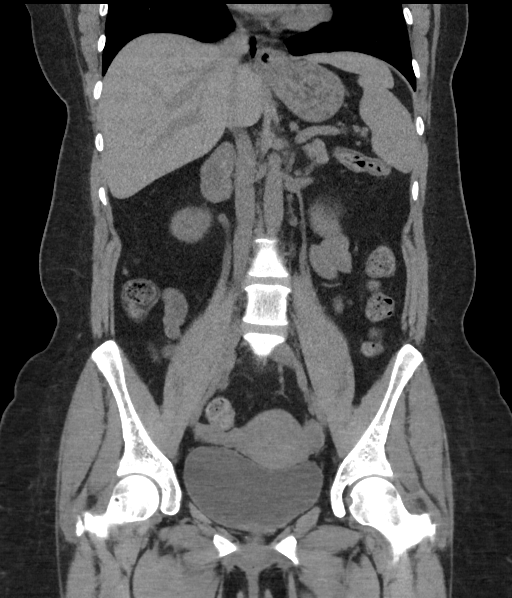

[16 of 46 positions shown; findings below may reference images not displayed]

FINDINGS: Lower chest: No significant pulmonary nodules or acute consolidative
airspace disease. Partially visualized left breast prosthesis with
evidence of intracapsular rupture. Tip of superior approach central
venous catheter is seen at the cavoatrial junction.

Hepatobiliary: Normal liver size. No liver mass. Normal gallbladder
with no radiopaque cholelithiasis. No biliary ductal dilatation.

Pancreas: Normal, with no mass or duct dilation.

Spleen: Normal size. No mass.

Adrenals/Urinary Tract: Normal adrenals. Right ureterovesical
junction obstructing 3 mm stone with mild right
hydroureteronephrosis. No stones in the kidneys. No left
hydronephrosis. Normal caliber left ureter. No additional ureteral
stones. No contour deforming renal mass. Normal bladder.

Stomach/Bowel: Small hiatal hernia. Otherwise normal nondistended
stomach. Normal caliber small bowel with no small bowel wall
thickening. Normal appendix. Normal large bowel with no
diverticulosis, large bowel wall thickening or pericolonic fat
stranding.

Vascular/Lymphatic: Normal caliber abdominal aorta. No
pathologically enlarged lymph nodes in the abdomen or pelvis.

Reproductive: Grossly normal uterus.  No adnexal mass.

Other: No pneumoperitoneum, ascites or focal fluid collection. Small
fat containing periumbilical hernia.

Musculoskeletal: No aggressive appearing focal osseous lesions.
IMPRESSION: 1. Obstructing 3 mm right UVJ stone with mild right
hydroureteronephrosis. No additional urolithiasis.
2. Small hiatal hernia.

These results will be called to the ordering clinician or
representative by the [HOSPITAL] at the imaging location.

## 2020-03-30 NOTE — Progress Notes (Signed)
Patient Care Team: Carol Ada, MD as PCP - General (Family Medicine) Jovita Kussmaul, MD as Consulting Physician (General Surgery) Nicholas Lose, MD as Consulting Physician (Hematology and Oncology) Bensimhon, Shaune Pascal, MD as Consulting Physician (Cardiology)  DIAGNOSIS:    ICD-10-CM   1. Malignant neoplasm of upper-outer quadrant of left breast in female, estrogen receptor positive ([place])  C50.412    Z17.0     SUMMARY OF ONCOLOGIC HISTORY: Oncology History  Malignant neoplasm of upper-outer quadrant of left breast in female, estrogen receptor positive ([place])  04/16/2017 Initial Diagnosis   Screening mammogram detected calcifications left breast UOQ 9.3 cm, at 2:00: 1.4 cm and at 1:00: 8 mm; biopsy of mass at 1:00: IDC grade 2 with DCIS ER 95%, PR 90%,Ki-67 45%, HER-2 positive ratio 5.18; iopsy of the 2:00 mass and calcifications were DCIS with suspicion for microinvasion, T1 cN0 stage IA clinical stage AJCC 8   05/09/2017 Genetic Testing   The patient had genetic testing due to a personal and family history of breast cancer.  The Common Hereditary Cancer Panel was ordered.  The Hereditary Gene Panel offered by [facility] includes sequencing and/or deletion duplication testing of the following 47 genes: APC, ATM, AXIN2, BARD1, BMPR1A, BRCA1, BRCA2, BRIP1, CDH1, CDKN2A (p14ARF), CDKN2A (p16INK4a), CKD4, CHEK2, CTNNA1, DICER1, EPCAM (Deletion/duplication testing only), GREM1 (promoter region deletion/duplication testing only), KIT, MEN1, MLH1, MSH2, MSH3, MSH6, MUTYH, NBN, NF1, NHTL1, PALB2, PDGFRA, PMS2, POLD1, POLE, PTEN, RAD50, RAD51C, RAD51D, SDHB, SDHC, SDHD, SMAD4, SMARCA4. STK11, TP53, TSC1, TSC2, and VHL.  The following genes were evaluated for sequence changes only: SDHA and HOXB13 c.251G>A variant only.  Results- No pathogenic variants identified.  A Variant of Uncertain Significance was identified in the gene APC c.-30626C>G (Promoter 1B).  The date of this test report is 05/09/2017.      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 with above-mentioned history of left breast cancer treated with neoadjuvant chemotherapy, mastectomy, adjuvant Herceptin Perjeta maintenance, and who is currently on anti-estrogen therapy withtamoxifen. Mammogram on 11/25/19 showed no evidence of malignancy bilaterally. She presents to the clinic todayfor follow-up. Her hot flashes are tolerable.  Muscle cramps have subsided since she started drinking tonic water at bedtime.  ALLERGIES:  has No Known Allergies.  MEDICATIONS:  Current Outpatient Medications  Medication Sig Dispense Refill  . acetaminophen (TYLENOL) 325 MG tablet Take 650 mg by mouth daily as needed for headache.    . lovastatin (MEVACOR) 40 MG tablet Take 40 mg by mouth every evening.    . tamoxifen (NOLVADEX) 20 MG tablet Take 1 tablet (20 mg total) by mouth daily. 90 tablet 3   No current facility-administered medications for this visit.    PHYSICAL EXAMINATION: ECOG PERFORMANCE STATUS: 1 - Symptomatic but completely ambulatory  Vitals:   03/31/20 0826  BP: (!) 153/94  Pulse: (!) 102  Resp: 18  SpO2: 100%   Filed Weights   03/31/20 0826  Weight: 147 lb (  66.7 kg)    BREAST: No palpable masses or nodules   (exam performed in the presence of a chaperone)  LABORATORY DATA:  I have reviewed the data as listed CMP Latest Ref Rng & Units 06/25/2018 05/14/2018 04/02/2018  Glucose 70 - 99 mg/dL 120(H) 180(H)  127(H)  BUN 6 - 20 mg/dL _0 Creatinine 0.44 - 1.00 mg/dL 0.82 0.96 0.81  Sodium 135 - 145 mmol/L 144 140 141  Potassium 3.5 - 5.1 mmol/L 3.4(L) 3.4(L) 3.3(L)  Chloride 98 - 111 mmol/L 107 105 104  CO2 22 - 32 mmol/L _1 Calcium 8.9 - 10.3 mg/dL 8.9 8.6(L) 8.8(L)  Total Protein 6.5 - 8.1 g/dL 6.5 6.2(L) 6.3(L)  Total Bilirubin 0.3 - 1.2 mg/dL 0.4 <0.2(L) 0.3  Alkaline Phos 38 - 126 U/L 90 86 93  AST 15 - 41 U/L _2 ALT 0 - 44 U/L _3 Lab Results  Component Value Date   WBC 6.5 06/25/2018   HGB 12.6 06/25/2018   HCT 36.7 06/25/2018   MCV 96.1 06/25/2018   PLT 266 06/25/2018   NEUTROABS 3.8 06/25/2018    ASSESSMENT & PLAN:  Malignant neoplasm of upper-outer quadrant of left breast in female, estrogen receptor positive ([place]) 06/20/2017 left mastectomy: 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 Adjuvant TCH Perjeta followed by Herceptin and Perjeta maintenance for 1 year completed 06/26/2019 2. followed by adjuvant antiestrogen therapy -------------------------------------------------------------------- Current treatment:Tamoxifenstarted 02/19/2018.  Tamoxifen toxicities: 1.  Leg cramps which get better with drinking tonic water 2.  Moderate hot flashes Renewed her prescription for tamoxifen for another year.  Heartburn with occasional difficulty with swallowing: I instructed her to stop take Protonix once again.  If her symptoms persist then we will need to get gastroenterology evaluation.  I instructed her to exercise 30 minutes every day to improve the heartburn symptoms.  Hypertension: Patient will discuss with her primary care physician about the need for blood pressure medication.  Breast cancer surveillance:  1. Right breast mammogram 11/25/2019: Benign, breast density  category B 2.  Breast exam 03/31/2009: Benign  Return to clinic in 1 year for follow-up    No orders of the defined types were placed in this encounter.  The patient has a good understanding of the overall plan. she agrees with it. she will call with any problems that may develop before the next visit here.  Total time spent: 20 mins including face to face time and time spent for planning, charting and coordination of care  Nicholas Lose, MD 03/31/2020  I, Cloyde Reams Dorshimer, am acting as scribe for Dr. Nicholas Lose.  I have reviewed the above documentation for accuracy and completeness, and I agree with the above.

## 2020-03-31 ENCOUNTER — Inpatient Hospital Stay: Payer: Self-pay | Attending: Hematology and Oncology | Admitting: Hematology and Oncology

## 2020-03-31 ENCOUNTER — Other Ambulatory Visit: Payer: Self-pay | Admitting: Hematology and Oncology

## 2020-03-31 ENCOUNTER — Other Ambulatory Visit: Payer: Self-pay

## 2020-03-31 DIAGNOSIS — C50412 Malignant neoplasm of upper-outer quadrant of left female breast: Secondary | ICD-10-CM | POA: Insufficient documentation

## 2020-03-31 DIAGNOSIS — I10 Essential (primary) hypertension: Secondary | ICD-10-CM | POA: Insufficient documentation

## 2020-03-31 DIAGNOSIS — Z79899 Other long term (current) drug therapy: Secondary | ICD-10-CM | POA: Insufficient documentation

## 2020-03-31 DIAGNOSIS — Z170 Estrogen receptor positive status [ER+]: Secondary | ICD-10-CM | POA: Insufficient documentation

## 2020-03-31 DIAGNOSIS — Z9221 Personal history of antineoplastic chemotherapy: Secondary | ICD-10-CM | POA: Insufficient documentation

## 2020-03-31 DIAGNOSIS — Z79810 Long term (current) use of selective estrogen receptor modulators (SERMs): Secondary | ICD-10-CM | POA: Insufficient documentation

## 2020-03-31 MED ORDER — TAMOXIFEN CITRATE 20 MG PO TABS: 20.0000 mg | ORAL_TABLET | Freq: Every day | ORAL | 3 refills | Status: DC

## 2020-03-31 NOTE — Assessment & Plan Note (Signed)
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 Adjuvant TCH Perjeta followed by Herceptin and Perjeta maintenance for 1 year completed 06/26/2019 2. followed by adjuvant antiestrogen therapy -------------------------------------------------------------------- Current treatment:Tamoxifenstarted 02/19/2018.  Tamoxifen toxicities: 1.  Leg cramps which get better with drinking tonic water 2.  Moderate hot flashes Renewed her prescription for tamoxifen for another year.  Heartburn with occasional difficulty with swallowing: I instructed her to stop take Protonix once again.  If her symptoms persist then we will need to get gastroenterology evaluation.  I instructed her to exercise 30 minutes every day to improve the heartburn symptoms.  Hypertension: Patient will discuss with her primary care physician about the need for blood pressure medication.  Breast cancer surveillance:  1. Right breast mammogram 11/25/2019: Benign, breast density category B 2.  Breast exam 03/31/2009: Benign  Return to clinic in 1 year for follow-up

## 2020-07-14 ENCOUNTER — Other Ambulatory Visit: Payer: Self-pay | Admitting: Obstetrics and Gynecology

## 2020-07-14 DIAGNOSIS — Z1231 Encounter for screening mammogram for malignant neoplasm of breast: Secondary | ICD-10-CM

## 2020-11-26 ENCOUNTER — Other Ambulatory Visit: Payer: Self-pay

## 2020-11-26 ENCOUNTER — Ambulatory Visit
Admission: RE | Admit: 2020-11-26 | Discharge: 2020-11-26 | Disposition: A | Discharge status: Home / Self Care | Payer: No Typology Code available for payment source | Source: Ambulatory Visit | Attending: Obstetrics and Gynecology | Admitting: Obstetrics and Gynecology

## 2020-11-26 ENCOUNTER — Other Ambulatory Visit: Payer: Self-pay | Admitting: Hematology and Oncology

## 2020-11-26 ENCOUNTER — Other Ambulatory Visit (HOSPITAL_COMMUNITY): Payer: Self-pay

## 2020-11-26 DIAGNOSIS — Z1231 Encounter for screening mammogram for malignant neoplasm of breast: Secondary | ICD-10-CM

## 2020-11-26 MED FILL — Tamoxifen Citrate Tab 20 MG (Base Equivalent): ORAL | 90 days supply | Qty: 90 | Fill #0 | Status: AC

## 2021-03-18 ENCOUNTER — Other Ambulatory Visit (HOSPITAL_COMMUNITY): Payer: Self-pay

## 2021-03-18 ENCOUNTER — Telehealth: Payer: Self-pay | Admitting: Hematology and Oncology

## 2021-03-18 MED FILL — Tamoxifen Citrate Tab 20 MG (Base Equivalent): ORAL | 90 days supply | Qty: 90 | Fill #0 | Status: AC

## 2021-03-18 NOTE — Telephone Encounter (Signed)
Rescheduled per provider. Called pt and left a msg

## 2021-03-31 ENCOUNTER — Ambulatory Visit: Payer: Self-pay | Admitting: Hematology and Oncology

## 2021-04-12 NOTE — Progress Notes (Signed)
Patient Care Team: Carol Ada, MD as PCP - General (Family Medicine) Jovita Kussmaul, MD as Consulting Physician (General Surgery) Nicholas Lose, MD as Consulting Physician (Hematology and Oncology) Bensimhon, Shaune Pascal, MD as Consulting Physician (Cardiology)  DIAGNOSIS:    ICD-10-CM   1. Malignant neoplasm of upper-outer quadrant of left breast in female, estrogen receptor positive ([place])  C50.412    Z17.0       SUMMARY OF ONCOLOGIC HISTORY: Oncology History  Malignant neoplasm of upper-outer quadrant of left breast in female, estrogen receptor positive ([place])  04/16/2017 Initial Diagnosis   Screening mammogram detected calcifications left breast UOQ 9.3 cm, at 2:00: 1.4 cm and at 1:00: 8 mm; biopsy of mass at 1:00: IDC grade 2 with DCIS ER 95%, PR 90%,Ki-67 45%, HER-2 positive ratio 5.18; iopsy of the 2:00 mass and calcifications were DCIS with suspicion for microinvasion, T1 cN0 stage IA clinical stage AJCC 8   05/09/2017 Genetic Testing   The patient had genetic testing due to a personal and family history of breast cancer.  The Common Hereditary Cancer Panel was ordered.  The Hereditary Gene Panel offered by [facility] includes sequencing and/or deletion duplication testing of the following 47 genes: APC, ATM, AXIN2, BARD1, BMPR1A, BRCA1, BRCA2, BRIP1, CDH1, CDKN2A (p14ARF), CDKN2A (p16INK4a), CKD4, CHEK2, CTNNA1, DICER1, EPCAM (Deletion/duplication testing only), GREM1 (promoter region deletion/duplication testing only), KIT, MEN1, MLH1, MSH2, MSH3, MSH6, MUTYH, NBN, NF1, NHTL1, PALB2, PDGFRA, PMS2, POLD1, POLE, PTEN, RAD50, RAD51C, RAD51D, SDHB, SDHC, SDHD, SMAD4, SMARCA4. STK11, TP53, TSC1, TSC2, and VHL.  The following genes were evaluated for sequence changes only: SDHA and HOXB13 c.251G>A variant only.  Results- No pathogenic variants identified.  A Variant of Uncertain Significance was identified in the gene APC c.-30626C>G (Promoter 1B).  The date of this test report is  05/09/2017.    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 with above-mentioned history of breast cancer treated with neoadjuvant chemotherapy, mastectomy, adjuvant Herceptin Perjeta maintenance, and who is currently on anti-estrogen therapy with tamoxifen. Mammogram on 11/26/2020 showed no evidence of malignancy bilaterally. She presents to the clinic today for follow-up.  She is tolerating tamoxifen much better than before.  She does not have much of any hot flashes.  The muscle cramps have improved and resolved after taking tonic water at bedtime every day.  She does feel that the heartburn has also resolved after she quit her job and is less stressed out.  ALLERGIES:  has No Known Allergies.  MEDICATIONS:  Current Outpatient Medications  Medication Sig Dispense Refill   acetaminophen (TYLENOL) 325 MG tablet Take 650 mg by mouth daily as needed for headache.     lovastatin (MEVACOR) 40 MG tablet Take 40 mg by mouth every evening.     tamoxifen (NOLVADEX) 20 MG tablet TAKE 1 TABLET (20 MG TOTAL) BY MOUTH DAILY. 90 tablet 3   No current facility-administered medications for this visit.    PHYSICAL EXAMINATION: ECOG PERFORMANCE  STATUS: 1 - Symptomatic but completely ambulatory  There were no vitals filed for this visit. There were no vitals filed for this visit.  BREAST: No palpable masses or nodules in either right or left breasts. No palpable axillary supraclavicular or  infraclavicular adenopathy no breast tenderness or nipple discharge. (exam performed in the presence of a chaperone)  LABORATORY DATA:  I have reviewed the data as listed CMP Latest Ref Rng & Units 06/25/2018 05/14/2018 04/02/2018  Glucose 70 - 99 mg/dL 120(H) 180(H) 127(H)  BUN 6 - 20 mg/dL _0 Creatinine 0.44 - 1.00 mg/dL 0.82 0.96 0.81  Sodium 135 - 145 mmol/L 144 140 141  Potassium 3.5 - 5.1 mmol/L 3.4(L) 3.4(L) 3.3(L)  Chloride 98 - 111 mmol/L 107 105 104  CO2 22 - 32 mmol/L _1 Calcium 8.9 - 10.3 mg/dL 8.9 8.6(L) 8.8(L)  Total Protein 6.5 - 8.1 g/dL 6.5 6.2(L) 6.3(L)  Total Bilirubin 0.3 - 1.2 mg/dL 0.4 <0.2(L) 0.3  Alkaline Phos 38 - 126 U/L 90 86 93  AST 15 - 41 U/L _2 ALT 0 - 44 U/L _3 Lab Results  Component Value Date   WBC 6.5 06/25/2018   HGB 12.6 06/25/2018   HCT 36.7 06/25/2018   MCV 96.1 06/25/2018   PLT 266 06/25/2018   NEUTROABS 3.8 06/25/2018    ASSESSMENT & PLAN:  Malignant neoplasm of upper-outer quadrant of left breast in female, estrogen receptor positive ([place]) 06/20/2017 left mastectomy: 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 Adjuvant TCH Perjeta followed by Herceptin and Perjeta maintenance for 1 year completed 06/26/2019 2. followed by adjuvant antiestrogen therapy -------------------------------------------------------------------- Current treatment: Tamoxifen started 02/19/2018.    Tamoxifen toxicities:  1.  Leg cramps mostly resolved with drinking tonic water 2.  hot flashes: Not bothering her anymore Renewed her prescription for tamoxifen for another year.   Heartburn with occasional difficulty with swallowing: Resolved with quitting her job.   Hypertension: Patient will discuss with her primary care physician about the need for blood pressure  medication.   Breast cancer surveillance:  1. Right breast mammogram  11/29/2020: Benign, breast density category B 2. Breast exam 04/13/21: Benign   Return to clinic in 1 year for follow-up    No orders of the defined types were placed in this encounter.  The patient has a good understanding of the overall plan. she agrees with it. she will call with any problems that may develop before the next visit here.  Total time spent: 20 mins including face to face time and time spent for planning, charting and coordination of care  Rulon Eisenmenger, MD, MPH 04/13/2021  I, Thana Ates, am acting as scribe for Dr. Nicholas Lose.  I have reviewed the above documentation for accuracy and completeness, and I agree with the above.

## 2021-04-13 ENCOUNTER — Other Ambulatory Visit: Payer: Self-pay

## 2021-04-13 ENCOUNTER — Other Ambulatory Visit (HOSPITAL_COMMUNITY): Payer: Self-pay

## 2021-04-13 ENCOUNTER — Inpatient Hospital Stay: Payer: Self-pay | Attending: Hematology and Oncology | Admitting: Hematology and Oncology

## 2021-04-13 DIAGNOSIS — Z923 Personal history of irradiation: Secondary | ICD-10-CM | POA: Insufficient documentation

## 2021-04-13 DIAGNOSIS — I10 Essential (primary) hypertension: Secondary | ICD-10-CM | POA: Insufficient documentation

## 2021-04-13 DIAGNOSIS — Z170 Estrogen receptor positive status [ER+]: Secondary | ICD-10-CM | POA: Insufficient documentation

## 2021-04-13 DIAGNOSIS — C50412 Malignant neoplasm of upper-outer quadrant of left female breast: Secondary | ICD-10-CM | POA: Insufficient documentation

## 2021-04-13 DIAGNOSIS — Z9012 Acquired absence of left breast and nipple: Secondary | ICD-10-CM | POA: Insufficient documentation

## 2021-04-13 DIAGNOSIS — Z9221 Personal history of antineoplastic chemotherapy: Secondary | ICD-10-CM | POA: Insufficient documentation

## 2021-04-13 DIAGNOSIS — Z79810 Long term (current) use of selective estrogen receptor modulators (SERMs): Secondary | ICD-10-CM | POA: Insufficient documentation

## 2021-04-13 MED ORDER — TAMOXIFEN CITRATE 20 MG PO TABS
20.0000 mg | ORAL_TABLET | Freq: Every day | ORAL | 3 refills | Status: DC
  Filled 2021-04-13: qty 90, fill #0
  Filled 2021-06-30: qty 90, 90d supply, fill #0
  Filled 2021-10-13: qty 90, 90d supply, fill #1
  Filled 2022-01-20 (×2): qty 90, 90d supply, fill #2

## 2021-04-13 NOTE — Assessment & Plan Note (Signed)
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 Adjuvant TCH Perjeta followed by Herceptin and Perjeta maintenance for 1 yearcompleted12/17/2020 2. followed by adjuvant antiestrogen therapy -------------------------------------------------------------------- Current treatment:Tamoxifenstarted 02/19/2018.  Tamoxifen toxicities: 1.Leg cramps which get better with drinking tonic water 2.Moderate hot flashes Renewed her prescription for tamoxifen for another year.  Heartburn with occasional difficulty with swallowing: I instructed her to stop take Protonix once again. If her symptoms persist then we will need to get gastroenterology evaluation. I instructed her to exercise 30 minutes every day to improve the heartburn symptoms.  Hypertension: Patient will discuss with her primary care physician about the need for blood pressure medication.  Breast cancer surveillance: 1.Right breast mammogram  11/29/2020: Benign, breast density category B 2.Breast exam 04/13/21: Benign  Return to clinic in 1 year for follow-up

## 2021-04-14 ENCOUNTER — Ambulatory Visit: Payer: Self-pay | Admitting: Hematology and Oncology

## 2021-06-30 ENCOUNTER — Other Ambulatory Visit (HOSPITAL_COMMUNITY): Payer: Self-pay

## 2021-10-13 ENCOUNTER — Other Ambulatory Visit (HOSPITAL_COMMUNITY): Payer: Self-pay

## 2021-10-27 ENCOUNTER — Other Ambulatory Visit: Payer: Self-pay | Admitting: Hematology and Oncology

## 2021-10-27 DIAGNOSIS — Z1231 Encounter for screening mammogram for malignant neoplasm of breast: Secondary | ICD-10-CM

## 2021-11-28 ENCOUNTER — Ambulatory Visit: Payer: No Typology Code available for payment source

## 2022-01-20 ENCOUNTER — Other Ambulatory Visit (HOSPITAL_COMMUNITY): Payer: Self-pay

## 2022-01-23 ENCOUNTER — Other Ambulatory Visit (HOSPITAL_COMMUNITY): Payer: Self-pay

## 2022-04-12 ENCOUNTER — Ambulatory Visit: Payer: No Typology Code available for payment source

## 2022-04-13 ENCOUNTER — Ambulatory Visit: Payer: No Typology Code available for payment source | Admitting: Hematology and Oncology

## 2022-05-04 ENCOUNTER — Other Ambulatory Visit: Payer: Self-pay | Admitting: Hematology and Oncology

## 2022-05-04 DIAGNOSIS — Z170 Estrogen receptor positive status [ER+]: Secondary | ICD-10-CM

## 2022-05-05 ENCOUNTER — Ambulatory Visit
Admission: RE | Admit: 2022-05-05 | Discharge: 2022-05-05 | Disposition: A | Discharge status: Home / Self Care | Payer: BC Managed Care – PPO | Source: Ambulatory Visit | Attending: Hematology and Oncology | Admitting: Hematology and Oncology

## 2022-05-05 ENCOUNTER — Other Ambulatory Visit (HOSPITAL_COMMUNITY): Payer: Self-pay

## 2022-05-05 DIAGNOSIS — Z1231 Encounter for screening mammogram for malignant neoplasm of breast: Secondary | ICD-10-CM

## 2022-05-05 MED ORDER — TAMOXIFEN CITRATE 20 MG PO TABS
20.0000 mg | ORAL_TABLET | Freq: Every day | ORAL | 0 refills | Status: DC
  Filled 2022-05-05: qty 30, 30d supply, fill #0

## 2022-05-07 NOTE — Progress Notes (Incomplete)
Patient Care Team: Carol Ada, MD as PCP - General (Family Medicine) Jovita Kussmaul, MD as Consulting Physician (General Surgery) Nicholas Lose, MD as Consulting Physician (Hematology and Oncology) Bensimhon, Shaune Pascal, MD as Consulting Physician (Cardiology)  DIAGNOSIS: No diagnosis found.  SUMMARY OF ONCOLOGIC HISTORY: Oncology History  Malignant neoplasm of upper-outer quadrant of left breast in female, estrogen receptor positive ([place])  04/16/2017 Initial Diagnosis   Screening mammogram detected calcifications left breast UOQ 9.3 cm, at 2:00: 1.4 cm and at 1:00: 8 mm; biopsy of mass at 1:00: IDC grade 2 with DCIS ER 95%, PR 90%,Ki-67 45%, HER-2 positive ratio 5.18; iopsy of the 2:00 mass and calcifications were DCIS with suspicion for microinvasion, T1 cN0 stage IA clinical stage AJCC 8   05/09/2017 Genetic Testing   The patient had genetic testing due to a personal and family history of breast cancer.  The Common Hereditary Cancer Panel was ordered.  The Hereditary Gene Panel offered by [facility] includes sequencing and/or deletion duplication testing of the following 47 genes: APC, ATM, AXIN2, BARD1, BMPR1A, BRCA1, BRCA2, BRIP1, CDH1, CDKN2A (p14ARF), CDKN2A (p16INK4a), CKD4, CHEK2, CTNNA1, DICER1, EPCAM (Deletion/duplication testing only), GREM1 (promoter region deletion/duplication testing only), KIT, MEN1, MLH1, MSH2, MSH3, MSH6, MUTYH, NBN, NF1, NHTL1, PALB2, PDGFRA, PMS2, POLD1, POLE, PTEN, RAD50, RAD51C, RAD51D, SDHB, SDHC, SDHD, SMAD4, SMARCA4. STK11, TP53, TSC1, TSC2, and VHL.  The following genes were evaluated for sequence changes only: SDHA and HOXB13 c.251G>A variant only.  Results- No pathogenic variants identified.  A Variant of Uncertain Significance was identified in the gene APC c.-30626C>G (Promoter 1B).  The date of this test report is 05/09/2017.    06/20/2017 Surgery   Left mastectomy: Multifocal IDC with DCIS tumor size is 1.1, 1.6 and 2.4 cm, margins negative, 0/5  lymph nodes negative, ER 95%, PR 90%, HER-2 positive ratio 5.18, Ki-67 45% T2N0 stage Ib pathologic stage   07/24/2017 - 11/09/2017 Chemotherapy   TCH P followed by Herceptin and Perjeta maintenance (taxotere discontinued after cycle 1 because of profound rash)   02/19/2018 -  Anti-estrogen oral therapy   Tamoxifen 20 mg daily     CHIEF COMPLIANT: Follow-up of left breast cancer on tamoxifen  INTERVAL HISTORY: Alexis Underwood is a 46 y.o. with above-mentioned history of breast cancer treated with neoadjuvant chemotherapy, mastectomy, adjuvant Herceptin Perjeta maintenance, and who is currently on anti-estrogen therapy with tamoxifen. Mammogram on 11/26/2020 showed no evidence of malignancy bilaterally. She presents to the clinic today for follow-up.   ALLERGIES:  has No Known Allergies.  MEDICATIONS:  Current Outpatient Medications  Medication Sig Dispense Refill   acetaminophen (TYLENOL) 325 MG tablet Take 650 mg by mouth daily as needed for headache.     lovastatin (MEVACOR) 40 MG tablet Take 40 mg by mouth every evening.     tamoxifen (NOLVADEX) 20 MG tablet Take 1 tablet (20 mg total) by mouth daily. 90 tablet 0   No current facility-administered medications for this visit.    PHYSICAL EXAMINATION: ECOG PERFORMANCE STATUS: {CHL ONC ECOG PS:[id number]}  There were no vitals filed for this visit. There were no vitals filed for this visit.  BREAST:*** No palpable masses or nodules in either right or left breasts. No palpable axillary supraclavicular or infraclavicular adenopathy no breast tenderness or nipple discharge. (exam performed in the presence of a chaperone)  LABORATORY DATA:  I have reviewed the data as listed    Latest Ref Rng & Units 06/25/2018    9:29 AM 05/14/2018  8:22 AM 04/02/2018   12:45 PM  CMP  Glucose 70 - 99 mg/dL 120  180  127   BUN 6 - 20 mg/dL _0 Creatinine 0.44 - 1.00 mg/dL 0.82  0.96  0.81   Sodium 135 - 145 mmol/L 144  140  141    Potassium 3.5 - 5.1 mmol/L 3.4  3.4  3.3   Chloride 98 - 111 mmol/L 107  105  104   CO2 22 - 32 mmol/L _1 Calcium 8.9 - 10.3 mg/dL 8.9  8.6  8.8   Total Protein 6.5 - 8.1 g/dL 6.5  6.2  6.3   Total Bilirubin 0.3 - 1.2 mg/dL 0.4  <0.2  0.3   Alkaline Phos 38 - 126 U/L 90  86  93   AST 15 - 41 U/L _2 ALT 0 - 44 U/L _3 Lab Results  Component Value Date   WBC 6.5 06/25/2018   HGB 12.6 06/25/2018   HCT 36.7 06/25/2018   MCV 96.1 06/25/2018   PLT 266 06/25/2018   NEUTROABS 3.8 06/25/2018    ASSESSMENT & PLAN:  No problem-specific Assessment & Plan notes found for this encounter.    No orders of the defined types were placed in this encounter.  The patient has a good understanding of the overall plan. she agrees with it. she will call with any problems that may develop before the next visit here. Total time spent: 30 mins including face to face time and time spent for planning, charting and co-ordination of care   Suzzette Righter, [place] 05/07/22    I Gardiner Coins am scribing for Dr. Lindi Adie  ***

## 2022-05-12 ENCOUNTER — Inpatient Hospital Stay: Payer: BC Managed Care – PPO | Admitting: Hematology and Oncology

## 2022-05-12 NOTE — Assessment & Plan Note (Deleted)
06/20/2017 left mastectomy: 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 Adjuvant TCH Perjeta followed by Herceptin and Perjeta maintenance for 1 year completed 06/26/2019 2. followed by adjuvant antiestrogen therapy -------------------------------------------------------------------- Current treatment: Tamoxifen started 02/19/2018.    Tamoxifen toxicities:  1.  Leg cramps mostly resolved with drinking tonic water 2.  hot flashes: Not bothering her anymore Renewed her prescription for tamoxifen for another year.   Heartburn with occasional difficulty with swallowing: Resolved with quitting her job.   Hypertension: Patient will discuss with her primary care physician about the need for blood pressure medication.   Breast cancer surveillance:  1. Right breast mammogram 05/09/2022: Benign, breast density category B 2. Breast exam 05/12/2022: Benign   Return to clinic in 1 year for follow-up 

## 2022-05-20 NOTE — Progress Notes (Signed)
Patient Care Team: Carol Ada, MD as PCP - General (Family Medicine) Jovita Kussmaul, MD as Consulting Physician (General Surgery) Nicholas Lose, MD as Consulting Physician (Hematology and Oncology) Bensimhon, Shaune Pascal, MD as Consulting Physician (Cardiology)  DIAGNOSIS: No diagnosis found.  SUMMARY OF ONCOLOGIC HISTORY: Oncology History  Malignant neoplasm of upper-outer quadrant of left breast in female, estrogen receptor positive ([place])  04/16/2017 Initial Diagnosis   Screening mammogram detected calcifications left breast UOQ 9.3 cm, at 2:00: 1.4 cm and at 1:00: 8 mm; biopsy of mass at 1:00: IDC grade 2 with DCIS ER 95%, PR 90%,Ki-67 45%, HER-2 positive ratio 5.18; iopsy of the 2:00 mass and calcifications were DCIS with suspicion for microinvasion, T1 cN0 stage IA clinical stage AJCC 8   05/09/2017 Genetic Testing   The patient had genetic testing due to a personal and family history of breast cancer.  The Common Hereditary Cancer Panel was ordered.  The Hereditary Gene Panel offered by [facility] includes sequencing and/or deletion duplication testing of the following 47 genes: APC, ATM, AXIN2, BARD1, BMPR1A, BRCA1, BRCA2, BRIP1, CDH1, CDKN2A (p14ARF), CDKN2A (p16INK4a), CKD4, CHEK2, CTNNA1, DICER1, EPCAM (Deletion/duplication testing only), GREM1 (promoter region deletion/duplication testing only), KIT, MEN1, MLH1, MSH2, MSH3, MSH6, MUTYH, NBN, NF1, NHTL1, PALB2, PDGFRA, PMS2, POLD1, POLE, PTEN, RAD50, RAD51C, RAD51D, SDHB, SDHC, SDHD, SMAD4, SMARCA4. STK11, TP53, TSC1, TSC2, and VHL.  The following genes were evaluated for sequence changes only: SDHA and HOXB13 c.251G>A variant only.  Results- No pathogenic variants identified.  A Variant of Uncertain Significance was identified in the gene APC c.-30626C>G (Promoter 1B).  The date of this test report is 05/09/2017.    06/20/2017 Surgery   Left mastectomy: Multifocal IDC with DCIS tumor size is 1.1, 1.6 and 2.4 cm, margins negative, 0/5  lymph nodes negative, ER 95%, PR 90%, HER-2 positive ratio 5.18, Ki-67 45% T2N0 stage Ib pathologic stage   07/24/2017 - 11/09/2017 Chemotherapy   TCH P followed by Herceptin and Perjeta maintenance (taxotere discontinued after cycle 1 because of profound rash)   02/19/2018 -  Anti-estrogen oral therapy   Tamoxifen 20 mg daily     CHIEF COMPLIANT:  Follow-up of left breast cancer on tamoxifen    INTERVAL HISTORY: Alexis Underwood is a 46 y.o. with above-mentioned history of breast cancer treated with neoadjuvant chemotherapy, mastectomy, adjuvant Herceptin Perjeta maintenance, and who is currently on anti-estrogen therapy with tamoxifen. She presents to the clinic for a follow-up.   ALLERGIES:  has No Known Allergies.  MEDICATIONS:  Current Outpatient Medications  Medication Sig Dispense Refill   acetaminophen (TYLENOL) 325 MG tablet Take 650 mg by mouth daily as needed for headache.     lovastatin (MEVACOR) 40 MG tablet Take 40 mg by mouth every evening.     tamoxifen (NOLVADEX) 20 MG tablet Take 1 tablet (20 mg total) by mouth daily. 90 tablet 0   No current facility-administered medications for this visit.    PHYSICAL EXAMINATION: ECOG PERFORMANCE STATUS: {CHL ONC ECOG PS:[id number]}  There were no vitals filed for this visit. There were no vitals filed for this visit.  BREAST:*** No palpable masses or nodules in either right or left breasts. No palpable axillary supraclavicular or infraclavicular adenopathy no breast tenderness or nipple discharge. (exam performed in the presence of a chaperone)  LABORATORY DATA:  I have reviewed the data as listed    Latest Ref Rng & Units 06/25/2018    9:29 AM 05/14/2018    8:22 AM 04/02/2018  12:45 PM  CMP  Glucose 70 - 99 mg/dL 120  180  127   BUN 6 - 20 mg/dL _0 Creatinine 0.44 - 1.00 mg/dL 0.82  0.96  0.81   Sodium 135 - 145 mmol/L 144  140  141   Potassium 3.5 - 5.1 mmol/L 3.4  3.4  3.3   Chloride 98 - 111 mmol/L 107   105  104   CO2 22 - 32 mmol/L _1 Calcium 8.9 - 10.3 mg/dL 8.9  8.6  8.8   Total Protein 6.5 - 8.1 g/dL 6.5  6.2  6.3   Total Bilirubin 0.3 - 1.2 mg/dL 0.4  <0.2  0.3   Alkaline Phos 38 - 126 U/L 90  86  93   AST 15 - 41 U/L _2 ALT 0 - 44 U/L _3 Lab Results  Component Value Date   WBC 6.5 06/25/2018   HGB 12.6 06/25/2018   HCT 36.7 06/25/2018   MCV 96.1 06/25/2018   PLT 266 06/25/2018   NEUTROABS 3.8 06/25/2018    ASSESSMENT & PLAN:  No problem-specific Assessment & Plan notes found for this encounter.    No orders of the defined types were placed in this encounter.  The patient has a good understanding of the overall plan. she agrees with it. she will call with any problems that may develop before the next visit here. Total time spent: 30 mins including face to face time and time spent for planning, charting and co-ordination of care   Suzzette Righter, [place] 05/20/22    I Gardiner Coins am scribing for Dr. Lindi Adie  ***

## 2022-05-22 ENCOUNTER — Inpatient Hospital Stay: Payer: BC Managed Care – PPO | Attending: Hematology and Oncology | Admitting: Hematology and Oncology

## 2022-05-22 VITALS — BP 188/113 | HR 87 | Temp 98.2°F | Resp 18 | Ht 61.0 in | Wt 133.8 lb

## 2022-05-22 DIAGNOSIS — C50412 Malignant neoplasm of upper-outer quadrant of left female breast: Secondary | ICD-10-CM | POA: Diagnosis present

## 2022-05-22 DIAGNOSIS — Z79810 Long term (current) use of selective estrogen receptor modulators (SERMs): Secondary | ICD-10-CM | POA: Insufficient documentation

## 2022-05-22 DIAGNOSIS — Z170 Estrogen receptor positive status [ER+]: Secondary | ICD-10-CM | POA: Diagnosis not present

## 2022-05-22 MED ORDER — TAMOXIFEN CITRATE 20 MG PO TABS: 20.0000 mg | ORAL_TABLET | Freq: Every day | ORAL | 0 refills | Status: DC

## 2022-05-22 MED ORDER — LISINOPRIL 10 MG PO TABS: 10.0000 mg | ORAL_TABLET | Freq: Every day | ORAL | 3 refills | Status: DC

## 2022-05-22 MED ORDER — TAMOXIFEN CITRATE 20 MG PO TABS: 20.0000 mg | ORAL_TABLET | Freq: Every day | ORAL | 3 refills | Status: DC

## 2022-05-22 NOTE — Assessment & Plan Note (Signed)
06/20/2017 left mastectomy: 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 Adjuvant TCH Perjeta followed by Herceptin and Perjeta maintenance for 1 year completed 06/26/2019 2. followed by adjuvant antiestrogen therapy -------------------------------------------------------------------- Current treatment: Tamoxifen started 02/19/2018.    Tamoxifen toxicities:  1.  Leg cramps mostly resolved with drinking tonic water 2.  hot flashes: Not bothering her anymore Renewed her prescription for tamoxifen for another year.   Heartburn with occasional difficulty with swallowing: Resolved with quitting her job.   Hypertension: Patient will discuss with her primary care physician about the need for blood pressure medication.   Breast cancer surveillance:  1. Right breast mammogram 05/09/2022: Benign, breast density category B 2. Breast exam 05/22/2022: Benign   Return to clinic in 1 year for follow-up

## 2022-05-23 ENCOUNTER — Telehealth: Payer: Self-pay | Admitting: Hematology and Oncology

## 2022-05-23 NOTE — Telephone Encounter (Signed)
Scheduled appointment per 11/13 los. Patient is aware.

## 2022-11-08 ENCOUNTER — Telehealth: Payer: Self-pay | Admitting: *Deleted

## 2022-11-08 NOTE — Telephone Encounter (Signed)
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  Pt educated that wearing the compression sleeve would be helpful in preventing lymphedema, pt verbalized understanding.

## 2023-02-18 ENCOUNTER — Other Ambulatory Visit: Payer: Self-pay | Admitting: Hematology and Oncology

## 2023-02-19 ENCOUNTER — Telehealth: Payer: Self-pay

## 2023-02-19 NOTE — Telephone Encounter (Signed)
S/w pt regarding rx refill request for Lisinopril. Per MD last note, pt will need filled with new established PCP.   Pt states she is seeing Blima Singer, NP at [facility] in [place]. Last OV note faxed to their office Fax confirmation received.

## 2023-03-29 ENCOUNTER — Other Ambulatory Visit: Payer: Self-pay | Admitting: Hematology and Oncology

## 2023-03-29 DIAGNOSIS — Z1231 Encounter for screening mammogram for malignant neoplasm of breast: Secondary | ICD-10-CM

## 2023-05-11 ENCOUNTER — Ambulatory Visit
Admission: RE | Admit: 2023-05-11 | Discharge: 2023-05-11 | Disposition: A | Discharge status: Home / Self Care | Payer: BC Managed Care – PPO | Source: Ambulatory Visit | Attending: Hematology and Oncology | Admitting: Hematology and Oncology

## 2023-05-11 DIAGNOSIS — Z1231 Encounter for screening mammogram for malignant neoplasm of breast: Secondary | ICD-10-CM

## 2023-05-23 ENCOUNTER — Ambulatory Visit: Payer: BC Managed Care – PPO | Admitting: Hematology and Oncology

## 2023-05-25 ENCOUNTER — Inpatient Hospital Stay: Payer: BC Managed Care – PPO | Attending: Hematology and Oncology | Admitting: Hematology and Oncology

## 2023-05-25 VITALS — BP 159/103 | HR 95 | Temp 97.6°F | Resp 18 | Ht 61.0 in | Wt 130.7 lb

## 2023-05-25 DIAGNOSIS — Z79810 Long term (current) use of selective estrogen receptor modulators (SERMs): Secondary | ICD-10-CM | POA: Insufficient documentation

## 2023-05-25 DIAGNOSIS — C50412 Malignant neoplasm of upper-outer quadrant of left female breast: Secondary | ICD-10-CM | POA: Insufficient documentation

## 2023-05-25 DIAGNOSIS — Z79899 Other long term (current) drug therapy: Secondary | ICD-10-CM | POA: Diagnosis not present

## 2023-05-25 DIAGNOSIS — Z170 Estrogen receptor positive status [ER+]: Secondary | ICD-10-CM | POA: Insufficient documentation

## 2023-05-25 DIAGNOSIS — I10 Essential (primary) hypertension: Secondary | ICD-10-CM | POA: Diagnosis not present

## 2023-05-25 DIAGNOSIS — R12 Heartburn: Secondary | ICD-10-CM | POA: Diagnosis not present

## 2023-05-25 MED ORDER — TAMOXIFEN CITRATE 20 MG PO TABS
20.0000 mg | ORAL_TABLET | Freq: Every day | ORAL | 3 refills | Status: DC
Start: 2023-05-25 — End: 2024-05-26

## 2023-05-25 MED ORDER — LISINOPRIL 10 MG PO TABS: 15.0000 mg | ORAL_TABLET | Freq: Every day | ORAL | Status: DC

## 2023-05-25 NOTE — Progress Notes (Signed)
Patient Care Team: Michel Santee, FNP as PCP - General (Family Medicine) Griselda Miner, MD as Consulting Physician (General Surgery) Serena Croissant, MD as Consulting Physician (Hematology and Oncology) Bensimhon, Bevelyn Buckles, MD as Consulting Physician (Cardiology)  DIAGNOSIS:  Encounter Diagnosis  Name Primary?   Malignant neoplasm of upper-outer quadrant of left breast in female, estrogen receptor positive ([facility]) Yes    SUMMARY OF ONCOLOGIC HISTORY: Oncology History  Malignant neoplasm of upper-outer quadrant of left breast in female, estrogen receptor positive ([facility])  04/16/2017 Initial Diagnosis   Screening mammogram detected calcifications left breast UOQ 9.3 cm, at 2:00: 1.4 cm and at 1:00: 8 mm; biopsy of mass at 1:00: IDC grade 2 with DCIS ER 95%, PR 90%,Ki-67 45%, HER-2 positive ratio 5.18; iopsy of the 2:00 mass and calcifications were DCIS with suspicion for microinvasion, T1 cN0 stage IA clinical stage AJCC 8   05/09/2017 Genetic Testing   The patient had genetic testing due to a personal and family history of breast cancer.  The Common Hereditary Cancer Panel was ordered.  The Hereditary Gene Panel offered by [facility] includes sequencing and/or deletion duplication testing of the following 47 genes: APC, ATM, AXIN2, BARD1, BMPR1A, BRCA1, BRCA2, BRIP1, CDH1, CDKN2A (p14ARF), CDKN2A (p16INK4a), CKD4, CHEK2, CTNNA1, DICER1, EPCAM (Deletion/duplication testing only), GREM1 (promoter region deletion/duplication testing only), KIT, MEN1, MLH1, MSH2, MSH3, MSH6, MUTYH, NBN, NF1, NHTL1, PALB2, PDGFRA, PMS2, POLD1, POLE, PTEN, RAD50, RAD51C, RAD51D, SDHB, SDHC, SDHD, SMAD4, SMARCA4. STK11, TP53, TSC1, TSC2, and VHL.  The following genes were evaluated for sequence changes only: SDHA and HOXB13 c.251G>A variant only.  Results- No pathogenic variants identified.  A Variant of Uncertain Significance was identified in the gene APC c.-30626C>G (Promoter 1B).  The date of this test report is  05/09/2017.    06/20/2017 Surgery   Left mastectomy: Multifocal IDC with DCIS tumor size is 1.1, 1.6 and 2.4 cm, margins negative, 0/5 lymph nodes negative, ER 95%, PR 90%, HER-2 positive ratio 5.18, Ki-67 45% T2N0 stage Ib pathologic stage   07/24/2017 - 11/09/2017 Chemotherapy   TCH P followed by Herceptin and Perjeta maintenance (taxotere discontinued after cycle 1 because of profound rash)   02/19/2018 -  Anti-estrogen oral therapy   Tamoxifen 20 mg daily     CHIEF COMPLIANT: Follow-up on tamoxifen therapy  HISTORY OF PRESENT ILLNESS:   History of Present Illness   The patient, with a history of breast cancer, presents for a routine follow-up. She has been on tamoxifen for over five years, since August 2019, and reports no significant side effects. The patient notes that cramps have decreased and she has tonic on hand for any breakthrough cramping. She denies any hot flashes. She is agreeable to continuing tamoxifen for the full ten years as previously discussed. She denies any new breast pain or discomfort.  The patient has recently lost 10-15 pounds on the keto diet. She started a new job at a [employer] in August and is enjoying it. She also reports that her recent mammogram in November was normal and that her breast density has decreased, which she understands reduces her risk of breast cancer.  The patient is also on lisinopril for blood pressure control, recently increased to 15mg . She is also taking supplements prescribed by an integrative medicine provider, who is monitoring her blood pressure and blood sugar levels. The patient reports that her blood sugar levels have been elevated recently.         ALLERGIES:  has No Known  Allergies.  MEDICATIONS:  Current Outpatient Medications  Medication Sig Dispense Refill   lisinopril (ZESTRIL) 10 MG tablet Take 1.5 tablets (15 mg total) by mouth daily.     tamoxifen (NOLVADEX) 20 MG tablet Take 1 tablet (20 mg total) by mouth  daily. 90 tablet 3   No current facility-administered medications for this visit.    PHYSICAL EXAMINATION: ECOG PERFORMANCE STATUS: 1 - Symptomatic but completely ambulatory  Vitals:   05/25/23 1205  BP: (!) 159/103  Pulse: 95  Resp: 18  Temp: 97.6 F (36.4 C)  SpO2: 100%   Filed Weights   05/25/23 1205  Weight: 130 lb 11.2 oz (59.3 kg)    Physical Exam          (exam performed in the presence of a chaperone)  LABORATORY DATA:  I have reviewed the data as listed    Latest Ref Rng & Units 06/25/2018    9:29 AM 05/14/2018    8:22 AM 04/02/2018   12:45 PM  CMP  Glucose 70 - 99 mg/dL 010  272  536   BUN 6 - 20 mg/dL 13  10  12    Creatinine 0.44 - 1.00 mg/dL 6.44  0.34  7.42   Sodium 135 - 145 mmol/L 144  140  141   Potassium 3.5 - 5.1 mmol/L 3.4  3.4  3.3   Chloride 98 - 111 mmol/L 107  105  104   CO2 22 - 32 mmol/L 27  25  29    Calcium 8.9 - 10.3 mg/dL 8.9  8.6  8.8   Total Protein 6.5 - 8.1 g/dL 6.5  6.2  6.3   Total Bilirubin 0.3 - 1.2 mg/dL 0.4  <5.9  0.3   Alkaline Phos 38 - 126 U/L 90  86  93   AST 15 - 41 U/L 16  17  18    ALT 0 - 44 U/L 17  16  15      Lab Results  Component Value Date   WBC 6.5 06/25/2018   HGB 12.6 06/25/2018   HCT 36.7 06/25/2018   MCV 96.1 06/25/2018   PLT 266 06/25/2018   NEUTROABS 3.8 06/25/2018    ASSESSMENT & PLAN:  Malignant neoplasm of upper-outer quadrant of left breast in female, estrogen receptor positive ([facility]) 06/20/2017 left mastectomy: 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 Adjuvant TCH Perjeta followed by Herceptin and Perjeta maintenance for 1 year completed 06/26/2019 2. followed by adjuvant antiestrogen therapy -------------------------------------------------------------------- Current treatment: Tamoxifen started 02/19/2018.    Tamoxifen  toxicities:  1.  Leg cramps mostly resolved with drinking tonic water 2.  hot flashes: Not bothering her anymore Renewed her prescription for tamoxifen for another year.   Heartburn with occasional difficulty with swallowing: Resolved with quitting her job.   Hypertension:    Breast cancer surveillance:  1. Right breast mammogram 05/15/2023 right breast: Benign, breast density category B 2. Breast exam 05/25/2023: Benign   ------------------------------------- Assessment and Plan    Breast Cancer, history of On Tamoxifen for over 5 years with good tolerance and no significant side effects. Discussed and agreed to continue for a total of 10 years. -Continue Tamoxifen. -Next follow-up in 1 year.  Hypertension Recently increased Lisinopril to 15mg  daily. -Continue Lisinopril 15mg  daily.  General Health Maintenance Recent weight loss of 10-15 pounds  on Keto diet. Mammogram on 05/15/2023 showed reduced breast density, which is beneficial. -Continue current diet and lifestyle modifications. -Continue annual mammograms.          No orders of the defined types were placed in this encounter.  The patient has a good understanding of the overall plan. she agrees with it. she will call with any problems that may develop before the next visit here. Total time spent: 30 mins including face to face time and time spent for planning, charting and co-ordination of care   Tamsen Meek, MD 05/25/23

## 2023-05-25 NOTE — Assessment & Plan Note (Signed)
06/20/2017 left mastectomy: 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 Adjuvant TCH Perjeta followed by Herceptin and Perjeta maintenance for 1 year completed 06/26/2019 2. followed by adjuvant antiestrogen therapy -------------------------------------------------------------------- Current treatment: Tamoxifen started 02/19/2018.    Tamoxifen toxicities:  1.  Leg cramps mostly resolved with drinking tonic water 2.  hot flashes: Not bothering her anymore Renewed her prescription for tamoxifen for another year.   Heartburn with occasional difficulty with swallowing: Resolved with quitting her job.   Hypertension:    Breast cancer surveillance:  1. Right breast mammogram 05/15/2023 right breast: Benign, breast density category B 2. Breast exam 05/25/2023: Benign  Return to clinic in 1 year for follow-up   Return to clinic in 1 year for follow-up

## 2023-06-29 ENCOUNTER — Ambulatory Visit: Payer: BC Managed Care – PPO | Admitting: Hematology and Oncology

## 2024-05-26 ENCOUNTER — Other Ambulatory Visit: Payer: Self-pay | Admitting: Hematology and Oncology

## 2024-05-26 DIAGNOSIS — C50412 Malignant neoplasm of upper-outer quadrant of left female breast: Secondary | ICD-10-CM

## 2024-05-27 ENCOUNTER — Ambulatory Visit: Payer: BC Managed Care – PPO | Admitting: Hematology and Oncology

## 2024-06-24 ENCOUNTER — Inpatient Hospital Stay: Payer: Self-pay | Admitting: Hematology and Oncology

## 2024-06-24 VITALS — BP 155/83 | HR 98 | Temp 98.8°F | Resp 17 | Wt 127.9 lb

## 2024-06-24 DIAGNOSIS — C50412 Malignant neoplasm of upper-outer quadrant of left female breast: Secondary | ICD-10-CM

## 2024-06-24 MED ORDER — LISINOPRIL 30 MG PO TABS: 30.0000 mg | ORAL_TABLET | Freq: Every day | ORAL | Status: AC

## 2024-06-24 MED ORDER — METFORMIN HCL 500 MG PO TABS: 500.0000 mg | ORAL_TABLET | Freq: Two times a day (BID) | ORAL | Status: AC

## 2024-06-24 NOTE — Assessment & Plan Note (Signed)
° °  06/20/2017 left mastectomy: 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 Adjuvant TCH Perjeta  followed by Herceptin  and Perjeta  maintenance for 1 year completed 06/26/2019 2. followed by adjuvant antiestrogen therapy -------------------------------------------------------------------- Current treatment: Tamoxifen  started 02/19/2018.    Tamoxifen  toxicities:  1.  Leg cramps mostly resolved with drinking tonic water 2.  hot flashes: Not bothering her anymore Renewed her prescription for tamoxifen  for another year.   Heartburn with occasional difficulty with swallowing: Resolved with quitting her job.   Hypertension:    Breast cancer surveillance:  1. Right breast mammogram 05/15/2023 right breast: Benign, breast density category B.  She will need a new mammogram this year 2. Breast exam 06/24/2024: Benign

## 2024-06-24 NOTE — Progress Notes (Signed)
 Patient Care Team: Rosina Elveria LABOR, FNP as PCP - General (Family Medicine) Curvin Deward MOULD, MD as Consulting Physician (General Surgery) Odean Potts, MD as Consulting Physician (Hematology and Oncology) Bensimhon, Toribio SAUNDERS, MD as Consulting Physician (Cardiology)  DIAGNOSIS:  Encounter Diagnosis  Name Primary?   Malignant neoplasm of upper-outer quadrant of left breast in female, estrogen receptor positive (HCC) Yes    SUMMARY OF ONCOLOGIC HISTORY: Oncology History  Malignant neoplasm of upper-outer quadrant of left breast in female, estrogen receptor positive (HCC)  04/16/2017 Initial Diagnosis   Screening mammogram detected calcifications left breast UOQ 9.3 cm, at 2:00: 1.4 cm and at 1:00: 8 mm; biopsy of mass at 1:00: IDC grade 2 with DCIS ER 95%, PR 90%,Ki-67 45%, HER-2 positive ratio 5.18; iopsy of the 2:00 mass and calcifications were DCIS with suspicion for microinvasion, T1 cN0 stage IA clinical stage AJCC 8   05/09/2017 Genetic Testing   The patient had genetic testing due to a personal and family history of breast cancer.  The Common Hereditary Cancer Panel was ordered.  The Hereditary Gene Panel offered by [facility] includes sequencing and/or deletion duplication testing of the following 47 genes: APC, ATM, AXIN2, BARD1, BMPR1A, BRCA1, BRCA2, BRIP1, CDH1, CDKN2A (p14ARF), CDKN2A (p16INK4a), CKD4, CHEK2, CTNNA1, DICER1, EPCAM (Deletion/duplication testing only), GREM1 (promoter region deletion/duplication testing only), KIT, MEN1, MLH1, MSH2, MSH3, MSH6, MUTYH, NBN, NF1, NHTL1, PALB2, PDGFRA, PMS2, POLD1, POLE, PTEN, RAD50, RAD51C, RAD51D, SDHB, SDHC, SDHD, SMAD4, SMARCA4. STK11, TP53, TSC1, TSC2, and VHL.  The following genes were evaluated for sequence changes only: SDHA and HOXB13 c.251G>A variant only.  Results- No pathogenic variants identified.  A Variant of Uncertain Significance was identified in the gene APC c.-[id number]>G (Promoter 1B).  The date of this test report is  05/09/2017.    06/20/2017 Surgery   Left mastectomy: Multifocal IDC with DCIS tumor size is 1.1, 1.6 and 2.4 cm, margins negative, 0/5 lymph nodes negative, ER 95%, PR 90%, HER-2 positive ratio 5.18, Ki-67 45% T2N0 stage Ib pathologic stage   07/24/2017 - 11/09/2017 Chemotherapy   TCH P followed by Herceptin  and Perjeta  maintenance (taxotere  discontinued after cycle 1 because of profound rash)   02/19/2018 -  Anti-estrogen oral therapy   Tamoxifen  20 mg daily     CHIEF COMPLIANT: Follow-up on tamoxifen  therapy  HISTORY OF PRESENT ILLNESS:   History of Present Illness Alexis Underwood is a 48 year old woman with 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  She was diagnosed more than six years ago and is taking tamoxifen . She has no breast pain, mass, or nipple discharge.  She has not had a mammogram in the past year due to loss of insurance after her husband's job loss in August. She understands the need for surveillance imaging but is worried about medical costs.  Prior tamoxifen -related hot flashes and leg cramps have resolved. She now has only intermittent mild nocturnal leg cramps that resolve with ambulation and tonic water. She denies persistent, severe, or new side effects from cancer therapy.  She obtains tamoxifen  through [facility] and recently paid $4 for a one-month supply. She is under significant financial stress due to lack of insurance, which may limit access to recommended surveillance.       ALLERGIES:  has no known allergies.  MEDICATIONS:  Current Outpatient Medications  Medication Sig Dispense Refill   metFORMIN  (GLUCOPHAGE ) 500 MG tablet Take 1 tablet (500 mg total)  by mouth 2 (two) times daily with a meal.     lisinopril  (ZESTRIL ) 30 MG tablet Take 1 tablet (30 mg total) by mouth daily.     tamoxifen  (NOLVADEX ) 20 MG tablet TAKE 1 TABLET(20 MG) BY  MOUTH DAILY 90 tablet 3   No current facility-administered medications for this visit.    PHYSICAL EXAMINATION: ECOG PERFORMANCE STATUS: 1 - Symptomatic but completely ambulatory  Vitals:   06/24/24 1518  BP: (!) 155/83  Pulse: 98  Resp: 17  Temp: 98.8 F (37.1 C)  SpO2: 100%   Filed Weights   06/24/24 1518  Weight: 127 lb 14.4 oz (58 kg)    Physical Exam  Breast exam: No palpable lumps or nodules bilateral breasts or axilla  (exam performed in the presence of a chaperone)  LABORATORY DATA:  I have reviewed the data as listed    Latest Ref Rng & Units 06/25/2018    9:29 AM 05/14/2018    8:22 AM 04/02/2018   12:45 PM  CMP  Glucose 70 - 99 mg/dL 879  819  872   BUN 6 - 20 mg/dL 13  10  12    Creatinine 0.44 - 1.00 mg/dL 9.17  9.03  9.18   Sodium 135 - 145 mmol/L 144  140  141   Potassium 3.5 - 5.1 mmol/L 3.4  3.4  3.3   Chloride 98 - 111 mmol/L 107  105  104   CO2 22 - 32 mmol/L 27  25  29    Calcium 8.9 - 10.3 mg/dL 8.9  8.6  8.8   Total Protein 6.5 - 8.1 g/dL 6.5  6.2  6.3   Total Bilirubin 0.3 - 1.2 mg/dL 0.4  <9.7  0.3   Alkaline Phos 38 - 126 U/L 90  86  93   AST 15 - 41 U/L 16  17  18    ALT 0 - 44 U/L 17  16  15      Lab Results  Component Value Date   WBC 6.5 06/25/2018   HGB 12.6 06/25/2018   HCT 36.7 06/25/2018   MCV 96.1 06/25/2018   PLT 266 06/25/2018   NEUTROABS 3.8 06/25/2018    ASSESSMENT & PLAN:  Malignant neoplasm of upper-outer quadrant of left breast in female, estrogen receptor positive (HCC)   06/20/2017 left mastectomy: 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 Adjuvant TCH Perjeta  followed by Herceptin  and Perjeta  maintenance for 1 year completed 06/26/2019 2. followed by adjuvant antiestrogen  therapy -------------------------------------------------------------------- Current treatment: Tamoxifen  started 02/19/2018.    Tamoxifen  toxicities:  1.  Leg cramps mostly resolved with drinking tonic water 2.  hot flashes: Not bothering her anymore Renewed her prescription for tamoxifen  for another year.   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  Unfortunately her husband lost his employment and they do not have any health insurance at this time.   Hypertension: On lisinopril    Breast cancer surveillance:  1. Right breast mammogram 05/15/2023 right breast: Benign, breast density category B.  She will need a new mammogram this year.  We will try and reach out to be set up program to see if she can get a free mammogram. 2. Breast exam 06/24/2024: Benign  Return to clinic in 1 year for follow-up  No orders of the defined types were placed in this encounter.  The patient has a good understanding of the overall plan. she agrees with it. she will call with any problems that may develop before the next visit here.  I personally spent a total of 30 minutes in the care of the patient today including preparing to see the patient, getting/reviewing separately obtained history, performing a medically appropriate exam/evaluation, counseling and educating, placing orders, referring and communicating with other health care professionals, documenting clinical information in the EHR, independently interpreting results, communicating results, and coordinating care.   Viinay K Nicol Herbig, MD 06/24/2024

## 2024-06-25 ENCOUNTER — Telehealth: Payer: Self-pay

## 2024-06-25 NOTE — Telephone Encounter (Signed)
 Attempted to contact patient regarding [facility] Program and [facility] [employer]. Left message on identifying voicemail requesting a return call.

## 2024-06-26 ENCOUNTER — Telehealth: Payer: Self-pay

## 2024-06-26 NOTE — Telephone Encounter (Signed)
 [facility] Medicaid application completed and sent to patient for signature.

## 2025-06-24 ENCOUNTER — Inpatient Hospital Stay: Payer: Self-pay | Admitting: Hematology and Oncology
